# Patient Record
Sex: Female | Born: 1965 | Race: White | Hispanic: No | Smoking: Current every day smoker
Health system: Southern US, Community
[De-identification: ages and names within clinical notes are randomized; demographics above are authoritative.]

## PROBLEM LIST (undated history)

## (undated) DIAGNOSIS — G8929 Other chronic pain: Secondary | ICD-10-CM

## (undated) DIAGNOSIS — K279 Peptic ulcer, site unspecified, unspecified as acute or chronic, without hemorrhage or perforation: Secondary | ICD-10-CM

## (undated) DIAGNOSIS — K449 Diaphragmatic hernia without obstruction or gangrene: Secondary | ICD-10-CM

## (undated) DIAGNOSIS — M549 Dorsalgia, unspecified: Secondary | ICD-10-CM

## (undated) DIAGNOSIS — M1990 Unspecified osteoarthritis, unspecified site: Secondary | ICD-10-CM

## (undated) DIAGNOSIS — I639 Cerebral infarction, unspecified: Secondary | ICD-10-CM

## (undated) DIAGNOSIS — M79672 Pain in left foot: Secondary | ICD-10-CM

## (undated) DIAGNOSIS — M5430 Sciatica, unspecified side: Secondary | ICD-10-CM

## (undated) DIAGNOSIS — F329 Major depressive disorder, single episode, unspecified: Secondary | ICD-10-CM

## (undated) DIAGNOSIS — I10 Essential (primary) hypertension: Secondary | ICD-10-CM

## (undated) DIAGNOSIS — Q7270 Split foot, unspecified lower limb: Secondary | ICD-10-CM

## (undated) HISTORY — DX: Diaphragmatic hernia without obstruction or gangrene: K44.9

## (undated) HISTORY — PX: CLUB FOOT RELEASE: SHX1363

## (undated) HISTORY — DX: Essential (primary) hypertension: I10

## (undated) HISTORY — DX: Major depressive disorder, single episode, unspecified: F32.9

## (undated) HISTORY — DX: Peptic ulcer, site unspecified, unspecified as acute or chronic, without hemorrhage or perforation: K27.9

## (undated) HISTORY — DX: Split foot, unspecified lower limb: Q72.70

## (undated) HISTORY — DX: Unspecified osteoarthritis, unspecified site: M19.90

---

## 1991-08-11 HISTORY — PX: CHOLECYSTECTOMY: SHX55

## 1997-08-10 HISTORY — PX: TUBAL LIGATION: SHX77

## 2001-08-10 DIAGNOSIS — F32A Depression, unspecified: Secondary | ICD-10-CM

## 2001-08-10 HISTORY — DX: Depression, unspecified: F32.A

## 2004-10-16 ENCOUNTER — Emergency Department (HOSPITAL_COMMUNITY): Admission: EM | Admit: 2004-10-16 | Discharge: 2004-10-16 | Payer: Self-pay | Admitting: Emergency Medicine

## 2005-02-27 ENCOUNTER — Emergency Department (HOSPITAL_COMMUNITY): Admission: EM | Admit: 2005-02-27 | Discharge: 2005-02-27 | Payer: Self-pay | Admitting: Emergency Medicine

## 2007-02-24 ENCOUNTER — Emergency Department (HOSPITAL_COMMUNITY): Admission: EM | Admit: 2007-02-24 | Discharge: 2007-02-25 | Payer: Self-pay | Admitting: Emergency Medicine

## 2007-02-25 ENCOUNTER — Inpatient Hospital Stay (HOSPITAL_COMMUNITY): Admission: AD | Admit: 2007-02-25 | Discharge: 2007-02-28 | Payer: Self-pay | Admitting: *Deleted

## 2007-02-25 ENCOUNTER — Ambulatory Visit: Payer: Self-pay | Admitting: *Deleted

## 2007-04-04 ENCOUNTER — Emergency Department (HOSPITAL_COMMUNITY): Admission: EM | Admit: 2007-04-04 | Discharge: 2007-04-04 | Payer: Self-pay | Admitting: Emergency Medicine

## 2008-02-04 ENCOUNTER — Emergency Department (HOSPITAL_COMMUNITY): Admission: EM | Admit: 2008-02-04 | Discharge: 2008-02-05 | Payer: Self-pay | Admitting: Emergency Medicine

## 2008-08-05 ENCOUNTER — Emergency Department (HOSPITAL_COMMUNITY): Admission: EM | Admit: 2008-08-05 | Discharge: 2008-08-05 | Payer: Self-pay | Admitting: Emergency Medicine

## 2009-08-23 ENCOUNTER — Ambulatory Visit (HOSPITAL_COMMUNITY): Admission: RE | Admit: 2009-08-23 | Discharge: 2009-08-23 | Payer: Self-pay | Admitting: Family Medicine

## 2010-10-19 ENCOUNTER — Emergency Department (HOSPITAL_COMMUNITY)
Admission: EM | Admit: 2010-10-19 | Discharge: 2010-10-20 | Disposition: A | Discharge status: Home / Self Care | Payer: Self-pay | Attending: Emergency Medicine | Admitting: Emergency Medicine

## 2010-10-19 DIAGNOSIS — R109 Unspecified abdominal pain: Secondary | ICD-10-CM | POA: Insufficient documentation

## 2010-10-19 DIAGNOSIS — K861 Other chronic pancreatitis: Secondary | ICD-10-CM | POA: Insufficient documentation

## 2010-10-19 DIAGNOSIS — E876 Hypokalemia: Secondary | ICD-10-CM | POA: Insufficient documentation

## 2010-10-19 DIAGNOSIS — F1010 Alcohol abuse, uncomplicated: Secondary | ICD-10-CM | POA: Insufficient documentation

## 2010-10-19 LAB — COMPREHENSIVE METABOLIC PANEL
ALT: 76 U/L — ABNORMAL HIGH (ref 0–35)
AST: 57 U/L — ABNORMAL HIGH (ref 0–37)
Albumin: 3.6 g/dL (ref 3.5–5.2)
BUN: 9 mg/dL (ref 6–23)
CO2: 24 mEq/L (ref 19–32)
Calcium: 8.1 mg/dL — ABNORMAL LOW (ref 8.4–10.5)
Chloride: 103 mEq/L (ref 96–112)
Glucose, Bld: 102 mg/dL — ABNORMAL HIGH (ref 70–99)
Potassium: 3 mEq/L — ABNORMAL LOW (ref 3.5–5.1)
Total Protein: 6.1 g/dL (ref 6.0–8.3)

## 2010-10-19 LAB — URINALYSIS, ROUTINE W REFLEX MICROSCOPIC
Bilirubin Urine: NEGATIVE
Hgb urine dipstick: NEGATIVE
Ketones, ur: NEGATIVE mg/dL
Nitrite: NEGATIVE

## 2010-10-19 LAB — RAPID URINE DRUG SCREEN, HOSP PERFORMED
Amphetamines: NOT DETECTED
Barbiturates: NOT DETECTED
Cocaine: NOT DETECTED
Tetrahydrocannabinol: POSITIVE — AB

## 2010-10-19 LAB — LIPASE, BLOOD: Lipase: 58 U/L (ref 11–59)

## 2010-10-19 LAB — POCT PREGNANCY, URINE: Preg Test, Ur: NEGATIVE

## 2010-10-19 LAB — DIFFERENTIAL
Basophils Relative: 0 % (ref 0–1)
Eosinophils Absolute: 0 10*3/uL (ref 0.0–0.7)
Eosinophils Relative: 0 % (ref 0–5)
Monocytes Absolute: 0.9 10*3/uL (ref 0.1–1.0)
Monocytes Relative: 6 % (ref 3–12)
Neutrophils Relative %: 76 % (ref 43–77)

## 2010-10-19 LAB — CBC
HCT: 41.4 % (ref 36.0–46.0)
MCHC: 34.5 g/dL (ref 30.0–36.0)
RDW: 13.4 % (ref 11.5–15.5)
WBC: 13.4 10*3/uL — ABNORMAL HIGH (ref 4.0–10.5)

## 2010-12-23 NOTE — Discharge Summary (Signed)
NAMEDEE, Jasmine NO.:  [id number]   MEDICAL RECORD NO.:  [id number]          PATIENT TYPE:  IPS   LOCATION:  0306                          FACILITY:  [facility]   PHYSICIAN:  Jasmine Pang, M.D. DATE OF BIRTH:  Dec 29, 1965   DATE OF ADMISSION:  02/25/2007  DATE OF DISCHARGE:  02/28/2007                               DISCHARGE SUMMARY   IDENTIFICATION:  [age]-year-old divorced white female who was  admitted on an involuntary basis on February 25, 2007.   HISTORY OF PRESENT ILLNESS:  The patient has a history of depression and  alcohol use.  She was [age] admitted on petition papers, stating she was  depressed, suicidal and was attempting suicide by daily alcohol  ingestion as well as hydrocodone pills (a friend's pills).  She states  she tried to hurt self with an overdose on Vicodin.  She states that  she had been having conflict with her father and been drinking 12 packs  daily for the past 5 years.  Her last drink was the day before  admission.  This is the first [facility] admission for this patient.  She was  at [facility] 9 months.  The father has a history of alcoholism.  She has no  medical problems.  She is on no medications.  No known drug allergies.   PHYSICAL FINDINGS:  Short-statured female with no acute distress.  Physical exam was done at [facility] and there were no acute physical  problems.   ADMISSION LABORATORIES:  Alcohol level was 302.  Urinalysis negative.  BMET within normal limits.  UDS negative.  WBC was 10.8, MCV 101.5.  Hepatic profile was grossly within normal limits except for decreased  albumin of 3.4.   HOSPITAL COURSE:  Upon admission, the patient was started on Librium 25  mg p.o. q.6 hours p.r.n. withdrawal symptoms.  She was also started on  Ambien 5 mg p.o. nightly p.r.n., may repeat x1.  On February 25, 2007, she  was started on Celexa 20 mg p.o. daily.  On February 26, 2007, due to  elevated blood pressure, she was started on Catapres patch  0.1 mg q.7  days.  The patient tolerated medications well with no significant side  effects.  She was sad and tearful.  States she is depressed.  Also using  alcohol daily (12 packs per day).  She states she went to [facility] ED due to suicidal ideation.  She admits to having taken an  overdose of Vicodin and alcohol.  Stressors include not being able to  find a job, not having her own place, and living with her father.  She  was started on Celexa 20 mg daily.  She was able to participate  appropriately in unit therapeutic groups and activities.  Her mood and  mental status improved as the hospitalization progressed.  She became  less depressed.  She was not suicidal or homicidal.  Sleep and appetite  were good.  She continued to improve, and on February 28, 2007, mental  status improved markedly from admission status.  She  was friendly and  cooperative with good eye contact.  Speech normal rate and flow.  Psychomotor activity was within normal limits.  Mood euthymic.  Affect  wide range with no suicidal or homicidal ideation.  No thoughts of self-  injurious behavior.  No paranoia or delusions.  Thoughts were logical  and goal-directed.  Thought content no predominant theme.  The cognitive  was grossly within normal limits and back to baseline.  It was felt  patient was safe to be sent home today.   DISCHARGE DIAGNOSES:  AXIS I:  Major depressive disorder, recurrent,  severe, without psychosis.  Also alcohol dependence.  AXIS II:  None.  AXIS III:  Tobacco abuse.  AXIS IV:  Severe (problems with primary support group, occupational  problem, housing problem, economic problem, problems related to legal  system, other psychosocial problems, problems related to burden of  psychiatric illness).  AXIS V:  Global assessment of functioning upon discharge was 50.  Global  assessment of functioning upon admission was 35.  Global assessment of  functioning highest the past year 47.    DISCHARGE PLANS:  There were no specific activity level or dietary  restrictions.   DISCHARGE/PLAN:  The patient's discharge plans will be arranged by our  case manager.  She is to go to the follow-up free clinic for information  and follow-up on her high blood pressure and a Catapres patch.   DISCHARGE MEDICATIONS:  1. Celexa 20 mg daily.  2. Catapres-TTS patch apply weekly next on July 29 for 1 week.      Jasmine Pang, M.D.  Electronically Signed     BHS/MEDQ  D:  02/28/2007  T:  02/28/2007  Job:  [id number]

## 2011-05-07 LAB — RAPID URINE DRUG SCREEN, HOSP PERFORMED
Amphetamines: NOT DETECTED
Barbiturates: NOT DETECTED
Benzodiazepines: NOT DETECTED
Cocaine: NOT DETECTED
Opiates: NOT DETECTED

## 2011-05-07 LAB — BASIC METABOLIC PANEL
CO2: 23
Chloride: 104
Glucose, Bld: 113 — ABNORMAL HIGH
Potassium: 3.9

## 2011-05-07 LAB — CBC
HCT: 41.7
Hemoglobin: 14.5
MCHC: 34.8
MCV: 97.8
Platelets: 299
RBC: 4.26
WBC: 12.7 — ABNORMAL HIGH

## 2011-05-07 LAB — URINALYSIS, ROUTINE W REFLEX MICROSCOPIC
Bilirubin Urine: NEGATIVE
Ketones, ur: NEGATIVE
Nitrite: NEGATIVE

## 2011-05-22 LAB — CBC
HCT: 37.7
Hemoglobin: 13.1
Platelets: 287
RDW: 12

## 2011-05-22 LAB — DIFFERENTIAL
Monocytes Absolute: 1 — ABNORMAL HIGH
Monocytes Relative: 6
Neutro Abs: 10.6 — ABNORMAL HIGH

## 2011-05-22 LAB — COMPREHENSIVE METABOLIC PANEL
Albumin: 3.5
BUN: 5 — ABNORMAL LOW
Chloride: 104
GFR calc non Af Amer: >60
Glucose, Bld: 77
Potassium: 3.3 — ABNORMAL LOW
Sodium: 135
Total Bilirubin: 0.4
Total Protein: 6

## 2011-05-25 LAB — ETHANOL: Alcohol, Ethyl (B): 302 — ABNORMAL HIGH

## 2011-05-25 LAB — RAPID URINE DRUG SCREEN, HOSP PERFORMED
Barbiturates: NOT DETECTED
Opiates: NOT DETECTED
Tetrahydrocannabinol: NOT DETECTED

## 2011-05-25 LAB — HEPATIC FUNCTION PANEL
Albumin: 3.4 — ABNORMAL LOW
Total Protein: 6.5

## 2011-05-25 LAB — URINALYSIS, ROUTINE W REFLEX MICROSCOPIC
Glucose, UA: NEGATIVE
Ketones, ur: NEGATIVE
Leukocytes, UA: NEGATIVE
pH: 5.5

## 2011-05-25 LAB — BASIC METABOLIC PANEL
BUN: 9
CO2: 20
Chloride: 113 — ABNORMAL HIGH
Creatinine, Ser: 0.69
Glucose, Bld: 90

## 2011-05-25 LAB — CBC
HCT: 46
Hemoglobin: 15.8 — ABNORMAL HIGH
Platelets: 312
RBC: 4.54
WBC: 10.8 — ABNORMAL HIGH

## 2011-05-25 LAB — DIFFERENTIAL
Eosinophils Relative: 1
Lymphocytes Relative: 44
Lymphs Abs: 4.7 — ABNORMAL HIGH

## 2011-05-25 LAB — URINE MICROSCOPIC-ADD ON

## 2011-09-25 ENCOUNTER — Emergency Department (HOSPITAL_COMMUNITY): Payer: Self-pay

## 2011-09-25 ENCOUNTER — Emergency Department (HOSPITAL_COMMUNITY)
Admission: EM | Admit: 2011-09-25 | Discharge: 2011-09-25 | Disposition: A | Discharge status: Home / Self Care | Payer: Self-pay | Attending: Emergency Medicine | Admitting: Emergency Medicine

## 2011-09-25 ENCOUNTER — Other Ambulatory Visit: Payer: Self-pay

## 2011-09-25 ENCOUNTER — Encounter (HOSPITAL_COMMUNITY): Payer: Self-pay

## 2011-09-25 DIAGNOSIS — R079 Chest pain, unspecified: Secondary | ICD-10-CM | POA: Insufficient documentation

## 2011-09-25 DIAGNOSIS — R1013 Epigastric pain: Secondary | ICD-10-CM | POA: Insufficient documentation

## 2011-09-25 DIAGNOSIS — F17200 Nicotine dependence, unspecified, uncomplicated: Secondary | ICD-10-CM | POA: Insufficient documentation

## 2011-09-25 DIAGNOSIS — R10816 Epigastric abdominal tenderness: Secondary | ICD-10-CM | POA: Insufficient documentation

## 2011-09-25 DIAGNOSIS — K920 Hematemesis: Secondary | ICD-10-CM | POA: Insufficient documentation

## 2011-09-25 LAB — CBC
MCH: 35 pg — ABNORMAL HIGH (ref 26.0–34.0)
MCV: 100.2 fL — ABNORMAL HIGH (ref 78.0–100.0)
Platelets: 286 10*3/uL (ref 150–400)
RDW: 13.4 % (ref 11.5–15.5)

## 2011-09-25 LAB — COMPREHENSIVE METABOLIC PANEL
ALT: 21 U/L (ref 0–35)
Alkaline Phosphatase: 90 U/L (ref 39–117)
CO2: 23 mEq/L (ref 19–32)
Chloride: 103 mEq/L (ref 96–112)
GFR calc Af Amer: >90 mL/min (ref >90)
GFR calc non Af Amer: 86 mL/min — ABNORMAL LOW (ref >90)
Glucose, Bld: 97 mg/dL (ref 70–99)
Potassium: 3.9 mEq/L (ref 3.5–5.1)
Sodium: 138 mEq/L (ref 135–145)

## 2011-09-25 MED ORDER — HYDROMORPHONE HCL PF 1 MG/ML IJ SOLN
1.0000 mg | Freq: Once | INTRAMUSCULAR | Status: AC
  Administered 2011-09-25: 1 mg via INTRAVENOUS
  Filled 2011-09-25: qty 1

## 2011-09-25 MED ORDER — IOHEXOL 300 MG/ML  SOLN
100.0000 mL | Freq: Once | INTRAMUSCULAR | Status: AC | PRN
  Administered 2011-09-25: 100 mL via INTRAVENOUS

## 2011-09-25 MED ORDER — PANTOPRAZOLE SODIUM 40 MG IV SOLR
40.0000 mg | Freq: Once | INTRAVENOUS | Status: AC
  Administered 2011-09-25: 40 mg via INTRAVENOUS
  Filled 2011-09-25: qty 40

## 2011-09-25 MED ORDER — SODIUM CHLORIDE 0.9 % IV SOLN: INTRAVENOUS | Status: DC

## 2011-09-25 MED ORDER — PROMETHAZINE HCL 25 MG PO TABS: 25.0000 mg | ORAL_TABLET | Freq: Four times a day (QID) | ORAL | Status: AC | PRN

## 2011-09-25 MED ORDER — ONDANSETRON HCL 4 MG/2ML IJ SOLN
4.0000 mg | Freq: Once | INTRAMUSCULAR | Status: AC
  Administered 2011-09-25: 4 mg via INTRAVENOUS
  Filled 2011-09-25: qty 2

## 2011-09-25 MED ORDER — IOHEXOL 300 MG/ML  SOLN
40.0000 mL | Freq: Once | INTRAMUSCULAR | Status: AC | PRN
  Administered 2011-09-25: 40 mL via ORAL

## 2011-09-25 MED ORDER — HYDROCODONE-ACETAMINOPHEN 5-325 MG PO TABS: 1.0000 | ORAL_TABLET | Freq: Four times a day (QID) | ORAL | Status: AC | PRN

## 2011-09-25 MED ORDER — SODIUM CHLORIDE 0.9 % IV BOLUS (SEPSIS)
250.0000 mL | Freq: Once | INTRAVENOUS | Status: AC
  Administered 2011-09-25: 250 mL via INTRAVENOUS

## 2011-09-25 MED ORDER — FAMOTIDINE 20 MG PO TABS: 20.0000 mg | ORAL_TABLET | Freq: Two times a day (BID) | ORAL | Status: DC

## 2011-09-25 NOTE — ED Notes (Signed)
Pt over in ct

## 2011-09-25 NOTE — ED Notes (Signed)
Pt presents with epigastric pain and hematemesis x 2 weeks. Pt states blood in vomit is bright red and "with little chunks".

## 2011-09-25 NOTE — ED Provider Notes (Signed)
History   This chart was scribed for Jasmine Jakes, MD by Melba Coon. The patient was seen in room APA14/APA14 and the patient's care was started at 5:09PM.    CSN: [id number]  Arrival date & time 09/25/11  1515   First MD Initiated Contact with Patient 09/25/11 1644      Chief Complaint  Patient presents with  . Hematemesis  . Abdominal Pain    (Consider location/radiation/quality/duration/timing/severity/associated sxs/prior treatment) HPI Jasmine Davies is a 46 y.o. female who presents to the Emergency Department complaining of constant, non-radiating moderate to severe hematemesis with associated sharp abdominal pain with an onset 2 weeks ago. At onset, pt vomited a pool of blood, but since then has only vomited little chunks. Today, vomit 1x. Pt has never had anything like this before. Abd pain has been getting worse since onset. Pt rates the severity of the pain 8/10. Pt also c/o left-sided CP with left arm tingling, which was there well before onset and may be unrelated to present symptoms. Pt has not taken meds at home. No HA, cold, cough, nasal congestion, neck pain, back pain, edema,  rash, dysuria, or diarrhea. No known allergies to medications. 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  No PCP    History reviewed. No pertinent past medical history.  Past Surgical History  Procedure Date  . Club foot release     No family history on file.  History  Substance Use Topics  . Smoking status: Current Everyday Smoker -- 0.5 packs/day  . Smokeless tobacco: Not on file  . Alcohol Use: Yes    OB History    Grav Para Term Preterm Abortions TAB SAB Ect Mult Living                  Review of Systems 10 Systems reviewed and are negative for acute change except as noted in the HPI.  Allergies  Review of patient's allergies indicates no known allergies.  Home Medications   Current Outpatient Rx  Name Route Sig  Dispense Refill  . GOODY HEADACHE PO Oral Take 1 packet by mouth 2 (two) times daily as needed. For pain    . FAMOTIDINE 20 MG PO TABS Oral Take 1 tablet (20 mg total) by mouth 2 (two) times daily. 30 tablet 0  . HYDROCODONE-ACETAMINOPHEN 5-325 MG PO TABS Oral Take 1-2 tablets by mouth every 6 (six) hours as needed for pain. 10 tablet 0  . PROMETHAZINE HCL 25 MG PO TABS Oral Take 1 tablet (25 mg total) by mouth every 6 (six) hours as needed for nausea. 12 tablet 0    BP 139/91  Pulse 75  Temp(Src) 98.1 F (36.7 C) (Oral)  Resp 18  Ht 4\' 11"  (1.499 m)  Wt 150 lb (68.04 kg)  BMI 30.30 kg/m2  SpO2 100%  Physical Exam  Nursing note and vitals reviewed. Constitutional: She appears well-developed and well-nourished.       Awake, alert, nontoxic appearance.  HENT:  Head: Normocephalic and atraumatic.  Eyes: Conjunctivae and EOM are normal. Pupils are equal, round, and reactive to light. Right eye exhibits no discharge. Left eye exhibits no discharge.  Neck: Normal range of motion. Neck supple.  Cardiovascular: Normal rate, regular rhythm and normal heart sounds.   No murmur heard. Pulmonary/Chest: Effort normal and breath sounds normal. She has no wheezes. She exhibits no tenderness.  Abdominal: Soft. There is tenderness (epigastric). There is no rebound.  Musculoskeletal: She exhibits no edema and no tenderness.       Baseline ROM, no obvious new focal weakness.  Neurological:       Mental status and motor strength appears baseline for patient and situation.  Skin: Skin is warm. No rash noted.  Psychiatric: She has a normal mood and affect.    ED Course  Procedures (including critical care time)  DIAGNOSTIC STUDIES: Oxygen Saturation is 100% on room air, normal by my interpretation.    COORDINATION OF CARE:  Results for orders placed during the hospital encounter of 09/25/11  COMPREHENSIVE METABOLIC PANEL      Component Value Range   Sodium 138  135 - 145 (mEq/L)    Potassium 3.9  3.5 - 5.1 (mEq/L)   Chloride 103  96 - 112 (mEq/L)   CO2 23  19 - 32 (mEq/L)   Glucose, Bld 97  70 - 99 (mg/dL)   BUN 10  6 - 23 (mg/dL)   Creatinine, Ser 1.61  0.50 - 1.10 (mg/dL)   Calcium 9.3  8.4 - 09.6 (mg/dL)   Total Protein 7.3  6.0 - 8.3 (g/dL)   Albumin 4.0  3.5 - 5.2 (g/dL)   AST 20  0 - 37 (U/L)   ALT 21  0 - 35 (U/L)   Alkaline Phosphatase 90  39 - 117 (U/L)   Total Bilirubin 0.3  0.3 - 1.2 (mg/dL)   GFR calc non Af Amer 86 (*) >90 (mL/min)   GFR calc Af Amer >90  >90 (mL/min)  LIPASE, BLOOD      Component Value Range   Lipase 41  11 - 59 (U/L)  TROPONIN I      Component Value Range   Troponin I <0.30  <0.30 (ng/mL)  CBC      Component Value Range   WBC 10.9 (*) 4.0 - 10.5 (K/uL)   RBC 4.32  3.87 - 5.11 (MIL/uL)   Hemoglobin 15.1 (*) 12.0 - 15.0 (g/dL)   HCT 04.5  40.9 - 81.1 (%)   MCV 100.2 (*) 78.0 - 100.0 (fL)   MCH 35.0 (*) 26.0 - 34.0 (pg)   MCHC 34.9  30.0 - 36.0 (g/dL)   RDW 91.4  78.2 - 95.6 (%)   Platelets 286  150 - 400 (K/uL)     Dg Chest 2 View  09/25/2011  *RADIOLOGY REPORT*  Clinical Data: Hematemesis and sharp abdominal pain.  CHEST - 2 VIEW  Comparison: Abdominal CT 09/25/2011  Findings: Two views of the chest were obtained.  The lungs are clear bilaterally.  Heart and mediastinum are within normal limits. No evidence for edema or pleural effusions.  Cholecystectomy clips in the right upper quadrant. Trachea is midline.  Density just anterior to the upper thoracic spine on the lateral view probably represents overlying shadows.  There is no gross abnormality in this region on the frontal radiograph.  IMPRESSION: No acute chest findings.  Original Report Authenticated By: Richarda Overlie, M.D.   Ct Abdomen Pelvis W Contrast  09/25/2011  *RADIOLOGY REPORT*  Clinical Data: Sharp abdominal pain. Vomiting blood.  CT ABDOMEN AND PELVIS WITH CONTRAST  Technique:  Multidetector CT imaging of the abdomen and pelvis was performed following the  standard protocol during bolus administration of intravenous contrast.  Contrast: OMNIPAQUE IOHEXOL 300 MG/ML IV SOLN  Comparison: None.  Findings: There is a 2 mm noncalcified nodular density at the posterior right lung base on sequence #3, image 9.  There is no evidence for  free intraperitoneal air.  Focal pleural thickening along the medial right hemidiaphragm.  There is mild wall thickening involving the gastric antrum which is nonspecific and less prominent on the delayed images.  The gallbladder has been removed and there is dilatation of the biliary system which is likely secondary to the cholecystectomy. Common bile duct measures up to 1 cm.  There is no gross abnormality involving the pancreas.  Normal appearance of the liver, portal venous system, adrenal glands, kidneys and spleen. There is no significant free fluid or lymphadenopathy in the abdomen or pelvis.  Normal appearance of the uterus, adnexal structures and urinary bladder.  The appendix is gas-filled with a normal appearance.  No acute bony abnormality.  IMPRESSION: No acute abdominal or pelvic findings.  Mild dilatation of the biliary system and common bile duct measures up to 1 cm.  Findings are probably related to the cholecystectomy.  Mild thickening of the gastric antrum which could be within normal limits.  2 mm nodular density at the right lung base.  If the patient is at high risk for bronchogenic carcinoma, follow-up chest CT at 1 year is recommended.  If the patient is at low risk, no follow-up is needed.  This recommendation follows the consensus statement: Guidelines for Management of Small Pulmonary Nodules Detected on CT Scans:  A Statement from the Fleischner Society as published in Radiology 2005; 237:395-400.  Available online at: DietDisorder.cz.  Original Report Authenticated By: Richarda Overlie, M.D.     Date: 09/25/2011  Rate: 65  Rhythm: normal sinus rhythm  QRS Axis: normal   Intervals: normal  ST/T Wave abnormalities: normal  Conduction Disutrbances:none  Narrative Interpretation:   Old EKG Reviewed: none available    1. Epigastric abdominal pain       MDM  Patient with epigastric abdominal pain and vomiting some flecks of blood workup negative hemoglobin and hematocrit is normal CT scan negative lipase not consistent with pancreatitis LFTs not consistent with hepatitis. Suspect gastritis or perhaps peptic ulcer disease will start Pepcid. Referral to GI made.  I personally performed the services described in this documentation, which was scribed in my presence. The recorded information has been reviewed and considered.         Jasmine Jakes, MD 09/25/11 [phone]

## 2011-10-01 ENCOUNTER — Encounter: Payer: Self-pay | Admitting: Gastroenterology

## 2011-10-01 ENCOUNTER — Ambulatory Visit (INDEPENDENT_AMBULATORY_CARE_PROVIDER_SITE_OTHER): Payer: Self-pay | Admitting: Gastroenterology

## 2011-10-01 VITALS — BP 131/91 | HR 73 | Temp 98.1°F | Ht 59.0 in | Wt 153.2 lb

## 2011-10-01 DIAGNOSIS — R9389 Abnormal findings on diagnostic imaging of other specified body structures: Secondary | ICD-10-CM

## 2011-10-01 DIAGNOSIS — K920 Hematemesis: Secondary | ICD-10-CM

## 2011-10-01 DIAGNOSIS — R1013 Epigastric pain: Secondary | ICD-10-CM

## 2011-10-01 MED ORDER — OMEPRAZOLE 20 MG PO CPDR: 20.0000 mg | DELAYED_RELEASE_CAPSULE | Freq: Two times a day (BID) | ORAL | Status: DC

## 2011-10-01 NOTE — Assessment & Plan Note (Signed)
See "epigastric pain"

## 2011-10-01 NOTE — Patient Instructions (Signed)
Stop Pepcid.  Begin Prilosec 20 mg 30 minutes before breakfast and 30 minutes before dinner.   We have set you up for an upper endoscopy as soon as possible. You will need to obtain assistance with Lubertha Basque as soon as possible so we may proceed.

## 2011-10-01 NOTE — Progress Notes (Signed)
No PCP on file 

## 2011-10-01 NOTE — Assessment & Plan Note (Signed)
46 year old with chronic epigastric pain in the setting of daily ETOH use (12ppd) and intermittent use of Goody's powders. Stopped drinking reportedly after first episode of hematemesis. Recent large volume hematemesis at home several weeks ago. ED presentation 2/15 due to blood clots in emesis. No anemia at the time, no LFT elevation. CT with findings gastric antrum thickening. Mild dilation of CBD up to 1 cm but likely physiological. Pt likely dealing with gastritis/PUD secondary to ETOH and aspirin powders. Will pursue EGD as soon as possible; pt is pending [place] assistance. Instructed to present to ED if any further hematemesis or worsening of symptoms.   Proceed with upper endoscopy in the near future with Dr. Darrick Penna, with Propofol due to hx of heavy ETOH use. The risks, benefits, and alternatives have been discussed in detail with patient. They have stated understanding and desire to proceed.  Prilosec 20 mg po BID, sent to pharmacy

## 2011-10-01 NOTE — Assessment & Plan Note (Signed)
Noted 2 mm nodular density of right lung base on CT chest 09/25/11. Will need follow-up CT chest in 1 year, Feb 2014.

## 2011-10-01 NOTE — Progress Notes (Signed)
Primary Care Physician:  No primary provider on file. Primary Gastroenterologist:  Dr. Darrick Penna   Chief Complaint  Patient presents with  . Abdominal Pain  . Nausea    HPI:   Jasmine Davies presents today after ED presentation on 2/15 secondary to hematemesis. Reports pure hematemesis several weeks ago. Second time presented to ED on 2/15 with clots. Reports onset of epigastric pain precipitating first episode. Has noted intermittent epigastric pain chronically for almost a year. States abdomen becomes bloated/hard after eating. Goody's powders routinely in the past (approximately 4 years ago). Now taken 3 Goody's since out of hospital. No NSAIDs. Used to drink heavily (ETOH) prior to first episode of hematemesis. Beer usually 12 pack a day, since age 45. Quit cold [place] after first episode of hematemesis.   Given Pepcid BID in ED. Stools appear darker than normal.  No rectal bleeding. No constipation/diarrhea.   2/15 ED Labs: No LFT abnormalities                         Lipase nl                         Hgb 15.1, PLTs 286  CT 2/15: IMPRESSION:  No acute abdominal or pelvic findings. Mild dilatation of the biliary system and common bile duct measures up to 1 cm. Findings are probably related to the cholecystectomy. Mild thickening of the gastric antrum which could be within normal limits. 2 mm nodular density at the right lung base. If the patient is at high risk for bronchogenic carcinoma, follow-up chest CT at 1 year  is recommended. If the patient is at low risk, no follow-up is needed.    NO PERTINENT PMH  Past Surgical History  Procedure Date  . Club foot release     left foot  . Cholecystectomy   . Tubal ligation     Current Outpatient Prescriptions  Medication Sig Dispense Refill  . Aspirin-Acetaminophen-Caffeine (GOODY HEADACHE PO) Take 1 packet by mouth 2 (two) times daily as needed. For pain      . HYDROcodone-acetaminophen (NORCO) 5-325 MG per tablet Take 1-2 tablets by  mouth every 6 (six) hours as needed for pain.  10 tablet  0  . promethazine (PHENERGAN) 25 MG tablet Take 1 tablet (25 mg total) by mouth every 6 (six) hours as needed for nausea.  12 tablet  0  . omeprazole (PRILOSEC) 20 MG capsule Take 1 capsule (20 mg total) by mouth 2 (two) times daily. Take 30 minutes before breakfast and 30 minutes before dinner  60 capsule  3    Allergies as of 10/01/2011  . (No Known Allergies)    Family History  Problem Relation Age of Onset  . Colon cancer Neg Hx     History   Social History  . Marital Status: Divorced    Spouse Name: N/A    Number of Children: N/A  . Years of Education: N/A   Occupational History  . Not on file.   Social History Main Topics  . Smoking status: Current Everyday Smoker -- 0.5 packs/day  . Smokeless tobacco: Not on file  . Alcohol Use: Yes     12 pack of beer daily   . Drug Use: No  . Sexually Active: Yes    Birth Control/ Protection: None   Other Topics Concern  . Not on file   Social History Narrative  . No narrative on  file    Review of Systems: Gen: Denies any fever, chills, fatigue, weight loss, lack of appetite.  CV: Denies chest pain, heart palpitations, peripheral edema, syncope.  Resp: Denies shortness of breath at rest or with exertion. Denies wheezing or cough.  GI: SEE HPI GU : Denies urinary burning, urinary frequency, urinary hesitancy MS: Denies joint pain, muscle weakness, cramps, or limitation of movement.  Derm: Denies rash, itching, dry skin Psych: Denies depression, anxiety, memory loss, and confusion Heme: Denies bruising, bleeding, and enlarged lymph nodes.  Physical Exam: BP 131/91  Pulse 73  Temp(Src) 98.1 F (36.7 C) (Temporal)  Ht 4\' 11"  (1.499 m)  Wt 153 lb 3.2 oz (69.491 kg)  BMI 30.94 kg/m2 General:   Alert and oriented. Pleasant and cooperative. Well-nourished and well-developed.  Head:  Normocephalic and atraumatic. Eyes:  Without icterus, sclera clear and conjunctiva  pink.  Ears:  Normal auditory acuity. Nose:  No deformity, discharge,  or lesions. Mouth:  No deformity or lesions, oral mucosa pink.  Neck:  Supple, without mass or thyromegaly. Lungs:  Clear to auscultation bilaterally. No wheezes, rales, or rhonchi. No distress.  Heart:  S1, S2 present without murmurs appreciated.  Abdomen:  +BS, soft, TTP LUQ, EPIGASTRIC REGION and non-distended. No HSM noted. No guarding or rebound. No masses appreciated.  Rectal:  Deferred  Msk:  Symmetrical without gross deformities. Normal posture. Extremities:  Without clubbing or edema. Neurologic:  Alert and  oriented x4;  grossly normal neurologically. Skin:  Intact without significant lesions or rashes. Cervical Nodes:  No significant cervical adenopathy. Psych:  Alert and cooperative. Normal mood and affect.

## 2011-10-06 ENCOUNTER — Encounter (HOSPITAL_COMMUNITY)
Admission: RE | Disposition: A | Discharge status: Home / Self Care | Payer: Self-pay | Source: Ambulatory Visit | Attending: Gastroenterology

## 2011-10-06 ENCOUNTER — Other Ambulatory Visit: Payer: Self-pay | Admitting: General Practice

## 2011-10-06 ENCOUNTER — Encounter (HOSPITAL_COMMUNITY): Payer: Self-pay | Admitting: *Deleted

## 2011-10-06 ENCOUNTER — Ambulatory Visit (HOSPITAL_COMMUNITY)
Admission: RE | Admit: 2011-10-06 | Discharge: 2011-10-06 | Disposition: A | Discharge status: Home / Self Care | Payer: Self-pay | Source: Ambulatory Visit | Attending: Gastroenterology | Admitting: Gastroenterology

## 2011-10-06 DIAGNOSIS — K449 Diaphragmatic hernia without obstruction or gangrene: Secondary | ICD-10-CM | POA: Insufficient documentation

## 2011-10-06 DIAGNOSIS — K259 Gastric ulcer, unspecified as acute or chronic, without hemorrhage or perforation: Secondary | ICD-10-CM

## 2011-10-06 DIAGNOSIS — R1013 Epigastric pain: Secondary | ICD-10-CM | POA: Insufficient documentation

## 2011-10-06 DIAGNOSIS — K2980 Duodenitis without bleeding: Secondary | ICD-10-CM | POA: Insufficient documentation

## 2011-10-06 DIAGNOSIS — K2940 Chronic atrophic gastritis without bleeding: Secondary | ICD-10-CM | POA: Insufficient documentation

## 2011-10-06 HISTORY — PX: ESOPHAGOGASTRODUODENOSCOPY: SHX5428

## 2011-10-06 SURGERY — EGD (ESOPHAGOGASTRODUODENOSCOPY)
Anesthesia: Moderate Sedation

## 2011-10-06 MED ORDER — PROMETHAZINE HCL 25 MG/ML IJ SOLN
INTRAMUSCULAR | Status: AC
  Filled 2011-10-06: qty 1

## 2011-10-06 MED ORDER — MIDAZOLAM HCL 5 MG/5ML IJ SOLN
INTRAMUSCULAR | Status: AC
  Filled 2011-10-06: qty 10

## 2011-10-06 MED ORDER — MIDAZOLAM HCL 5 MG/5ML IJ SOLN
INTRAMUSCULAR | Status: DC | PRN
  Administered 2011-10-06 (×2): 2 mg via INTRAVENOUS

## 2011-10-06 MED ORDER — SODIUM CHLORIDE 0.9 % IJ SOLN
INTRAMUSCULAR | Status: AC
  Filled 2011-10-06: qty 10

## 2011-10-06 MED ORDER — MEPERIDINE HCL 100 MG/ML IJ SOLN
INTRAMUSCULAR | Status: AC
  Filled 2011-10-06: qty 2

## 2011-10-06 MED ORDER — MEPERIDINE HCL 100 MG/ML IJ SOLN
INTRAMUSCULAR | Status: DC | PRN
  Administered 2011-10-06 (×2): 25 mg via INTRAVENOUS
  Administered 2011-10-06: 50 mg via INTRAVENOUS

## 2011-10-06 MED ORDER — PROMETHAZINE HCL 25 MG/ML IJ SOLN
INTRAMUSCULAR | Status: DC | PRN
  Administered 2011-10-06: 12.5 mg via INTRAVENOUS

## 2011-10-06 MED ORDER — STERILE WATER FOR IRRIGATION IR SOLN
Status: DC | PRN
  Administered 2011-10-06: 14:00:00

## 2011-10-06 MED ORDER — BUTAMBEN-TETRACAINE-BENZOCAINE 2-2-14 % EX AERO
INHALATION_SPRAY | CUTANEOUS | Status: DC | PRN
  Administered 2011-10-06: 2 via TOPICAL

## 2011-10-06 MED ORDER — SODIUM CHLORIDE 0.45 % IV SOLN
Freq: Once | INTRAVENOUS | Status: AC
  Administered 2011-10-06: 1000 mL via INTRAVENOUS

## 2011-10-06 NOTE — OR Nursing (Signed)
EPIC down during procedure. Completed charting in EPIC when EPIC was back up.

## 2011-10-06 NOTE — Interval H&P Note (Signed)
History and Physical Interval Note:  10/06/2011 2:15 PM  Jasmine Davies  has presented today for surgery, with the diagnosis of Epigastric Pain  The various methods of treatment have been discussed with the patient and family. After consideration of risks, benefits and other options for treatment, the patient has consented to  Procedure(s) (LRB): ESOPHAGOGASTRODUODENOSCOPY (EGD) (N/A) as a surgical intervention .  The patients' history has been reviewed, patient examined, no change in status, stable for surgery.  I have reviewed the patients' chart and labs.  Questions were answered to the patient's satisfaction.     [employer]

## 2011-10-06 NOTE — H&P (View-Only) (Signed)
Primary Care Physician:  No primary provider on file. Primary Gastroenterologist:  Dr. Fields   Chief Complaint  Patient presents with  . Abdominal Pain  . Nausea    HPI:   Jasmine Davies presents today after ED presentation on 2/15 secondary to hematemesis. Reports pure hematemesis several weeks ago. Second time presented to ED on 2/15 with clots. Reports onset of epigastric pain precipitating first episode. Has noted intermittent epigastric pain chronically for almost a year. States abdomen becomes bloated/hard after eating. Goody's powders routinely in the past (approximately 4 years ago). Now taken 3 Goody's since out of hospital. No NSAIDs. Used to drink heavily (ETOH) prior to first episode of hematemesis. Beer usually 12 pack a day, since age 18. Quit cold turkey after first episode of hematemesis.   Given Pepcid BID in ED. Stools appear darker than normal.  No rectal bleeding. No constipation/diarrhea.   2/15 ED Labs: No LFT abnormalities                         Lipase nl                         Hgb 15.1, PLTs 286  CT 2/15: IMPRESSION:  No acute abdominal or pelvic findings. Mild dilatation of the biliary system and common bile duct measures up to 1 cm. Findings are probably related to the cholecystectomy. Mild thickening of the gastric antrum which could be within normal limits. 2 mm nodular density at the right lung base. If the patient is at high risk for bronchogenic carcinoma, follow-up chest CT at 1 year  is recommended. If the patient is at low risk, no follow-up is needed.    NO PERTINENT PMH  Past Surgical History  Procedure Date  . Club foot release     left foot  . Cholecystectomy   . Tubal ligation     Current Outpatient Prescriptions  Medication Sig Dispense Refill  . Aspirin-Acetaminophen-Caffeine (GOODY HEADACHE PO) Take 1 packet by mouth 2 (two) times daily as needed. For pain      . HYDROcodone-acetaminophen (NORCO) 5-325 MG per tablet Take 1-2 tablets by  mouth every 6 (six) hours as needed for pain.  10 tablet  0  . promethazine (PHENERGAN) 25 MG tablet Take 1 tablet (25 mg total) by mouth every 6 (six) hours as needed for nausea.  12 tablet  0  . omeprazole (PRILOSEC) 20 MG capsule Take 1 capsule (20 mg total) by mouth 2 (two) times daily. Take 30 minutes before breakfast and 30 minutes before dinner  60 capsule  3    Allergies as of 10/01/2011  . (No Known Allergies)    Family History  Problem Relation Age of Onset  . Colon cancer Neg Hx     History   Social History  . Marital Status: Divorced    Spouse Name: N/A    Number of Children: N/A  . Years of Education: N/A   Occupational History  . Not on file.   Social History Main Topics  . Smoking status: Current Everyday Smoker -- 0.5 packs/day  . Smokeless tobacco: Not on file  . Alcohol Use: Yes     12 pack of beer daily   . Drug Use: No  . Sexually Active: Yes    Birth Control/ Protection: None   Other Topics Concern  . Not on file   Social History Narrative  . No narrative on   file    Review of Systems: Gen: Denies any fever, chills, fatigue, weight loss, lack of appetite.  CV: Denies chest pain, heart palpitations, peripheral edema, syncope.  Resp: Denies shortness of breath at rest or with exertion. Denies wheezing or cough.  GI: SEE HPI GU : Denies urinary burning, urinary frequency, urinary hesitancy MS: Denies joint pain, muscle weakness, cramps, or limitation of movement.  Derm: Denies rash, itching, dry skin Psych: Denies depression, anxiety, memory loss, and confusion Heme: Denies bruising, bleeding, and enlarged lymph nodes.  Physical Exam: BP 131/91  Pulse 73  Temp(Src) 98.1 F (36.7 C) (Temporal)  Ht 4' 11" (1.499 m)  Wt 153 lb 3.2 oz (69.491 kg)  BMI 30.94 kg/m2 General:   Alert and oriented. Pleasant and cooperative. Well-nourished and well-developed.  Head:  Normocephalic and atraumatic. Eyes:  Without icterus, sclera clear and conjunctiva  pink.  Ears:  Normal auditory acuity. Nose:  No deformity, discharge,  or lesions. Mouth:  No deformity or lesions, oral mucosa pink.  Neck:  Supple, without mass or thyromegaly. Lungs:  Clear to auscultation bilaterally. No wheezes, rales, or rhonchi. No distress.  Heart:  S1, S2 present without murmurs appreciated.  Abdomen:  +BS, soft, TTP LUQ, EPIGASTRIC REGION and non-distended. No HSM noted. No guarding or rebound. No masses appreciated.  Rectal:  Deferred  Msk:  Symmetrical without gross deformities. Normal posture. Extremities:  Without clubbing or edema. Neurologic:  Alert and  oriented x4;  grossly normal neurologically. Skin:  Intact without significant lesions or rashes. Cervical Nodes:  No significant cervical adenopathy. Psych:  Alert and cooperative. Normal mood and affect.    

## 2011-10-06 NOTE — Op Note (Signed)
[facility] [address] [place], [place]  [address]  ENDOSCOPY PROCEDURE REPORT  PATIENT:  Jasmine Davies, Jasmine Davies  MR#:  [id number] BIRTHDATE:  Apr 28, 1966, 45 yrs. old  GENDER:  female  ENDOSCOPIST:  Jonette Eva, MD Referred by:  PROCEDURE DATE:  10/06/2011 PROCEDURE:  EGD with biopsy, 43239 ASA CLASS: INDICATIONS:  EPIGASTRIC PAIN USE ETOH QD AND BC POWDERS  MEDICATIONS:   Demerol 100 mg IV, Versed 4 mg IV, promethazine (Phenergan) 12.5 mg IV TOPICAL ANESTHETIC:  Cetacaine Spray  DESCRIPTION OF PROCEDURE:     Physical exam was performed. Informed consent was obtained from the patient after explaining the benefits, risks, and alternatives to the procedure.  The patient was connected to the monitor and placed in the left lateral position.  Continuous oxygen was provided by nasal cannula and IV medicine administered through an indwelling cannula.  After administration of sedation, the patient's esophagus was intubated and the [id number] ([id number]) endoscope was advanced under direct visualization to the second portion of the duodenum.  The scope was removed slowly by carefully examining the color, texture, anatomy, and integrity of the mucosa on the way out.  The patient was recovered in endoscopy and discharged home in satisfactory condition. <<PROCEDUREIMAGES>>  Multiple ulcers were found in the antrum & BIOPSIED VIA COLD FORCEPS.  A SMALL hiatal hernia was found. NO ARRETT'S. ,IDL DUODENITIS.  COMPLICATIONS:    None  ENDOSCOPIC IMPRESSION: 1) Ulcers, multiple in the antrum & DUODENITIS CAUSING EPIGASTRIC PAIN 2) Hiatal hernia  RECOMMENDATIONS: AWAIT BIOPSIES STOP ETOH/ASA. WILL PROVIDE LIBRIUM IF NEEDED. OMP BID OPV IN 3 MOS. SCHEDULE EGD TO ASSESS HEALING AFTER NEXT VISIT.  REPEAT EXAM:  No  ______________________________ Jonette Eva, MD  CC:  n. eSIGNED:   Catha Ontko at 10/06/2011 04:21 PM  Crista Elliot, [phone]

## 2011-10-06 NOTE — Discharge Instructions (Addendum)
Cholesterol Control Diet Cholesterol levels in your body are determined significantly by your diet. Cholesterol levels may also be related to heart disease. The following material helps to explain this relationship and discusses what you can do to help keep your heart healthy. Not all cholesterol is bad. Low-density lipoprotein (LDL) cholesterol is the "bad" cholesterol. It may cause fatty deposits to build up inside your arteries. High-density lipoprotein (HDL) cholesterol is "good." It helps to remove the "bad" LDL cholesterol from your blood. Cholesterol is a very important risk factor for heart disease. Other risk factors are high blood pressure, smoking, stress, heredity, and weight. The heart muscle gets its supply of blood through the coronary arteries. If your LDL cholesterol is high and your HDL cholesterol is low, you are at risk for having fatty deposits build up in your coronary arteries. This leaves less room through which blood can flow. Without sufficient blood and oxygen, the heart muscle cannot function properly and you may feel chest pains (angina pectoris). When a coronary artery closes up entirely, a part of the heart muscle may die, causing a heart attack (myocardial infarction). CHECKING CHOLESTEROL When your caregiver sends your blood to a lab to be analyzed for cholesterol, a complete lipid (fat) profile may be done. With this test, the total amount of cholesterol and levels of LDL and HDL are determined. Triglycerides are a type of fat that circulates in the blood and can also be used to determine heart disease risk. The list below describes what the numbers should be: Test: Total Cholesterol.  Less than 200 mg/dl.  Test: LDL "bad cholesterol."  Less than 100 mg/dl.   Less than 70 mg/dl if you are at very high risk of a heart attack or sudden cardiac death.  Test: HDL "good cholesterol."  Greater than 50 mg/dl for women.   Greater than 40 mg/dl for men.  Test:  Triglycerides.  Less than 150 mg/dl.  CONTROLLING CHOLESTEROL WITH DIET Although exercise and lifestyle factors are important, your diet is key. That is because certain foods are known to raise cholesterol and others to lower it. The goal is to balance foods for their effect on cholesterol and more importantly, to replace saturated and trans fat with other types of fat, such as monounsaturated fat, polyunsaturated fat, and omega-3 fatty acids. On average, a person should consume no more than 15 to 17 g of saturated fat daily. Saturated and trans fats are considered "bad" fats, and they will raise LDL cholesterol. Saturated fats are primarily found in animal products such as meats, butter, and cream. However, that does not mean you need to sacrifice all your favorite foods. Today, there are good tasting, low-fat, low-cholesterol substitutes for most of the things you like to eat. Choose low-fat or nonfat alternatives. Choose round or loin cuts of red meat, since these types of cuts are lowest in fat and cholesterol. Chicken (without the skin), fish, veal, and ground [place] breast are excellent choices. Eliminate fatty meats, such as hot dogs and salami. Even shellfish have little or no saturated fat. Have a 3 oz (85 g) portion when you eat lean meat, poultry, or fish. Trans fats are also called "partially hydrogenated oils." They are oils that have been scientifically manipulated so that they are solid at room temperature resulting in a longer shelf life and improved taste and texture of foods in which they are added. Trans fats are found in stick margarine, some tub margarines, cookies, crackers, and baked goods.  When baking and cooking, oils are an excellent substitute for butter. The monounsaturated oils are especially beneficial since it is believed they lower LDL and raise HDL. The oils you should avoid entirely are saturated tropical oils, such as coconut and palm.  Remember to eat liberally from food  groups that are naturally free of saturated and trans fat, including fish, fruit, vegetables, beans, grains (barley, rice, couscous, bulgur wheat), and pasta (without cream sauces).  IDENTIFYING FOODS THAT LOWER CHOLESTEROL  Soluble fiber may lower your cholesterol. This type of fiber is found in fruits such as apples, vegetables such as broccoli, potatoes, and carrots, legumes such as beans, peas, and lentils, and grains such as barley. Foods fortified with plant sterols (phytosterol) may also lower cholesterol. You should eat at least 2 g per day of these foods for a cholesterol lowering effect.  Read package labels to identify low-saturated fats, trans fats free, and low-fat foods at the supermarket. Select cheeses that have only 2 to 3 g saturated fat per ounce. Use a heart-healthy tub margarine that is free of trans fats or partially hydrogenated oil. When buying baked goods (cookies, crackers), avoid partially hydrogenated oils. Breads and muffins should be made from whole grains (whole-wheat or whole oat flour, instead of "flour" or "enriched flour"). Buy non-creamy canned soups with reduced salt and no added fats.  FOOD PREPARATION TECHNIQUES  Never deep-fry. If you must fry, either stir-fry, which uses very little fat, or use non-stick cooking sprays. When possible, broil, bake, or roast meats, and steam vegetables. Instead of dressing vegetables with butter or margarine, use lemon and herbs, applesauce and cinnamon (for squash and sweet potatoes), nonfat yogurt, salsa, and low-fat dressings for salads.  LOW-SATURATED FAT / LOW-FAT FOOD SUBSTITUTES Meats / Saturated Fat (g)  Avoid: Steak, marbled (3 oz/85 g) / 11 g   Choose: Steak, lean (3 oz/85 g) / 4 g   Avoid: Hamburger (3 oz/85 g) / 7 g   Choose: Hamburger, lean (3 oz/85 g) / 5 g   Avoid: Ham (3 oz/85 g) / 6 g   Choose: Ham, lean cut (3 oz/85 g) / 2.4 g   Avoid: Chicken, with skin, dark meat (3 oz/85 g) / 4 g   Choose: Chicken,  skin removed, dark meat (3 oz/85 g) / 2 g   Avoid: Chicken, with skin, light meat (3 oz/85 g) / 2.5 g   Choose: Chicken, skin removed, light meat (3 oz/85 g) / 1 g  Dairy / Saturated Fat (g)  Avoid: Whole milk (1 cup) / 5 g   Choose: Low-fat milk, 2% (1 cup) / 3 g   Choose: Low-fat milk, 1% (1 cup) / 1.5 g   Choose: Skim milk (1 cup) / 0.3 g   Avoid: Hard cheese (1 oz/28 g) / 6 g   Choose: Skim milk cheese (1 oz/28 g) / 2 to 3 g   Avoid: Cottage cheese, 4% fat (1 cup) / 6.5 g   Choose: Low-fat cottage cheese, 1% fat (1 cup) / 1.5 g   Avoid: Ice cream (1 cup) / 9 g   Choose: Sherbet (1 cup) / 2.5 g   Choose: Nonfat frozen yogurt (1 cup) / 0.3 g   Choose: Frozen fruit bar / trace   Avoid: Whipped cream (1 tbs) / 3.5 g   Choose: Nondairy whipped topping (1 tbs) / 1 g  Condiments / Saturated Fat (g)  Avoid: Mayonnaise (1 tbs) / 2 g   Choose: Low-fat  mayonnaise (1 tbs) / 1 g   Avoid: Butter (1 tbs) / 7 g   Choose: Extra light margarine (1 tbs) / 1 g   Avoid: Coconut oil (1 tbs) / 11.8 g   Choose: Olive oil (1 tbs) / 1.8 g   Choose: Corn oil (1 tbs) / 1.7 g   Choose: Safflower oil (1 tbs) / 1.2 g   Choose: Sunflower oil (1 tbs) / 1.4 g   Choose: Soybean oil (1 tbs) / 2.4 g   Choose: Canola oil (1 tbs) / 1 g  Document Released: 07/27/2005 Document Revised: 04/08/2011 Document Reviewed: 01/15/2011 [facility] Patient Information 2012 [place], [place].Endoscopy Care After Please read the instructions outlined below and refer to this sheet in the next few weeks. These discharge instructions provide you with general information on caring for yourself after you leave the hospital. Your doctor may also give you specific instructions. While your treatment has been planned according to the most current medical practices available, unavoidable complications occasionally occur. If you have any problems or questions after discharge, please call your doctor. HOME CARE  INSTRUCTIONS Activity  You may resume your regular activity but move at a slower pace for the next 24 hours.   Take frequent rest periods for the next 24 hours.   Walking will help expel (get rid of) the air and reduce the bloated feeling in your abdomen.   No driving for 24 hours (because of the anesthesia (medicine) used during the test).   You may shower.   Do not sign any important legal documents or operate any machinery for 24 hours (because of the anesthesia used during the test).  Nutrition  Drink plenty of fluids.   You may resume your normal diet.   Begin with a light meal and progress to your normal diet.   Avoid alcoholic beverages for 24 hours or as instructed by your caregiver.  Medications You may resume your normal medications unless your caregiver tells you otherwise. What you can expect today  You may experience abdominal discomfort such as a feeling of fullness or "gas" pains.   You may experience a sore throat for 2 to 3 days. This is normal. Gargling with salt water may help this.  Follow-up Your doctor will discuss the results of your test with you. SEEK IMMEDIATE MEDICAL CARE IF:  You have excessive nausea (feeling sick to your stomach) and/or vomiting.   You have severe abdominal pain and distention (swelling).   You have trouble swallowing.   You have a temperature over 100 F (37.8 C).   You have rectal bleeding or vomiting of blood.  Document Released: 03/10/2004 Document Revised: 04/08/2011 Document Reviewed: 09/21/2007 [facility] Patient Information 2012 [place], [place].  Ulcer Disease You have an ulcer. This may be in your stomach (gastric ulcer) or in the first part of your small bowel, the duodenum (duodenal ulcer). An ulcer is a break in the lining of the stomach or duodenum. The ulcer causes erosion into the deeper tissue. CAUSES  The stomach has a lining to protect itself from the acid that digests food. The lining can be damaged in  two main ways:  The Helico Pylori bacteria (H. Pyolori) can infect the lining of the stomach and cause ulcers.   Nonsteroidal, anti-inflammatory medications (NSAIDS) can cause gastric ulcerations.   Smoking tobacco can increase the acid in the stomach. This can lead to ulcers, and will impair healing of ulcers.  Other factors, such as alcohol use and stress may  contribute to ulcer formation. Rarely, a tumor or cancer can cause an ulcer.  SYMPTOMS  The problems (symptoms) of ulcer disease are usually a burning or gnawing of the mid-upper belly (abdomen). This is often worse on an empty stomach and may get better with food. This may be associated with feeling sick to your stomach (nausea), bloating, and vomiting. If the ulcer results in bleeding, it can cause:  Black, tarry stools.   Vomiting of bright red blood.   Vomiting coffee-ground-looking materials.  With severe bleeding, there may be loss of consciousness and shock.  DIAGNOSIS  Learning what is wrong (diagnosis) is usually made based upon your history and an exam. Medications are so effective that further tests may not be necessary. If needed, other tests may include:  Blood tests, x-rays, or heart tests. These are done to be sure that no other conditions are causing your symptoms.   X-rays (Barium studies) such as an upper GI series.   Most commonly, an upper GI endoscopy can confirm your diagnosis. This is when a flexible tube is passed through the mouth (using sedative medication). The tube is used to look at the inside of esophagus, stomach, and small bowel. Abnormal pieces of tissue may be removed to examine under the microscope (biopsy).  TREATMENT  Bleeding from ulcers can usually be treated via endoscopy. Rarely, surgery is needed for ulcers when bleeding cannot be stopped. Surgery is needed if the ulcer goes through the wall of the stomach or duodenum.  After any bleeding is stopped, medications are the main treatment:  If  your ulcer was caused by the bacteria H. Pylori, then you will need antibiotics to kill the infection. Usually, a program involving more than one antibiotic, and a medicine for stomach acid, is prescribed.   Medicine to decrease acid production is used for almost all ulcers. Your caregiver will make a recommendation. They will also tell you how long you must use the medication.   Stop using any medications or substances that may have contributed to your ulcer (alcohol, tobacco, caffeine, for example).   Medications are available that protect the lining of the bowel.  HOME CARE INSTRUCTIONS   Continue regular work and usual activities unless advised otherwise by your caregiver.   Avoid tobacco, alcohol, and caffeine. Tobacco use will decrease and slow the rates of healing.   Avoid foods that seem to aggravate or cause discomfort.   There are many over-the-counter products available to control stomach acid and other symptoms. Discuss these with your caregiver before using them. DO NOT substitute over-the-counter medications for prescription medications without discussing with your caregiver.   Special diets are not usually needed.   Keep any follow-up appointments and blood tests, as directed.  SEEK MEDICAL CARE IF:   Your pain or other ulcer symptoms do not improve within a few days of starting treatment.   You develop diarrhea. This can be a complication of certain treatments.   You have ongoing indigestion or heartburn, even if your main ulcer symptoms are improved.  SEEK IMMEDIATE MEDICAL CARE IF:   You develop bright red, rectal bleeding; dark black, tarry stools; or vomit blood.   You become light-headed, weak, have fainting episodes, or become sweaty, cold and clammy.   You experience severe abdominal pain not controlled by medications. DO NOT take pain medications unless ordered by your caregiver.  Document Released: 05/06/2005 Document Revised: 04/08/2011 Document Reviewed:  03/24/2007 [facility] Patient Information 2012 [place], [place]  Hiatal Hernia A hiatal  hernia occurs when a part of the stomach slides above the diaphragm. The diaphragm is the thin muscle separating the belly (abdomen) from the chest. A hiatal hernia can be something you are born with or develop over time. Hiatal hernias may allow stomach acid to flow back into your esophagus, the tube which carries food from your mouth to your stomach. If this acid causes problems it is called GERD (gastro-esophageal reflux disease).  SYMPTOMS  Common symptoms of GERD are heartburn (burning in your chest). This is worse when lying down or bending over. It may also cause belching and indigestion. Some of the things which make GERD worse are:  Increased weight pushes on stomach making acid rise more easily.   Smoking markedly increases acid production.   Alcohol decreases lower esophageal sphincter pressure (valve between stomach and esophagus), allowing acid from stomach into esophagus.   Late evening meals and going to bed with a full stomach increases pressure.   Anything that causes an increase in acid production.   Lower esophageal sphincter incompetence.  DIAGNOSIS  Hiatal hernia is often diagnosed with x-rays of your stomach and small bowel. This is called an UGI (upper gastrointestinal x-ray). Sometimes a gastroscopic procedure is done. This is a procedure where your caregiver uses a flexible instrument to look into the stomach and small bowel. HOME CARE INSTRUCTIONS   Try to achieve and maintain an ideal body weight.   Avoid drinking alcoholic beverages.   Stop smoking.   Put the head of your bed on 4 to 6 inch blocks. This will keep your head and esophagus higher than your stomach. If you cannot use blocks, sleep with several pillows under your head and shoulders.   Over-the-counter medications will decrease acid production. Your caregiver can also prescribe medications for this. Take as  directed.   1/2 to 1 teaspoon of an antacid taken every hour while awake, with meals and at bedtime, will neutralize acid.   Do not take aspirin, ibuprofen (Advil or Motrin), or other nonsteroidal anti-inflammatory drugs.   Do not wear tight clothing around your chest or stomach.   Eat smaller meals and eat more frequently. This keeps your stomach from getting too full. Eat slowly.   Do not lie down for 2 or 3 hours after eating. Do not eat or drink anything 1 to 2 hours before going to bed.   Avoid caffeine beverages (colas, coffee, cocoa, tea), fatty foods, citrus fruits and all other foods and drinks that contain acid and that seem to increase the problems.   Avoid bending over, especially after eating. Also avoid straining during bowel movements or when urinating or lifting things. Anything that increases the pressure in your belly increases the amount of acid that may be pushed up into your esophagus.  SEEK IMMEDIATE MEDICAL CARE IF:  There is change in location (pain in arms, neck, jaw, teeth or back) of your pain, or the pain is getting worse.   You also experience nausea, vomiting, sweating (diaphoresis), or shortness of breath.   You develop continual vomiting, vomit blood or coffee ground material, have bright red blood in your stools, or have black tarry stools.  Some of these symptoms could signal other problems such as heart disease. MAKE SURE YOU:   Understand these instructions.   Monitor your condition.   Contact your caregiver if you are not doing well or are getting worse.  Document Released: 10/17/2003 Document Revised: 04/08/2011 Document Reviewed: 07/27/2005 [facility] Patient Information 2012 [place], [place].Marland Kitchen

## 2011-10-07 NOTE — Progress Notes (Signed)
REVIEWED.  

## 2011-10-09 ENCOUNTER — Emergency Department (HOSPITAL_COMMUNITY): Payer: Self-pay

## 2011-10-09 ENCOUNTER — Emergency Department (HOSPITAL_COMMUNITY)
Admission: EM | Admit: 2011-10-09 | Discharge: 2011-10-09 | Disposition: A | Discharge status: Home / Self Care | Payer: Self-pay | Attending: Emergency Medicine | Admitting: Emergency Medicine

## 2011-10-09 ENCOUNTER — Telehealth: Payer: Self-pay | Admitting: Gastroenterology

## 2011-10-09 ENCOUNTER — Encounter (HOSPITAL_COMMUNITY): Payer: Self-pay | Admitting: *Deleted

## 2011-10-09 ENCOUNTER — Other Ambulatory Visit: Payer: Self-pay

## 2011-10-09 DIAGNOSIS — R1013 Epigastric pain: Secondary | ICD-10-CM | POA: Insufficient documentation

## 2011-10-09 HISTORY — DX: Diaphragmatic hernia without obstruction or gangrene: K44.9

## 2011-10-09 LAB — COMPREHENSIVE METABOLIC PANEL
ALT: 15 U/L (ref 0–35)
Alkaline Phosphatase: 82 U/L (ref 39–117)
BUN: 14 mg/dL (ref 6–23)
CO2: 27 mEq/L (ref 19–32)
Chloride: 106 mEq/L (ref 96–112)
GFR calc Af Amer: >90 mL/min (ref >90)
GFR calc non Af Amer: >90 mL/min (ref >90)
Glucose, Bld: 91 mg/dL (ref 70–99)
Potassium: 4.1 mEq/L (ref 3.5–5.1)
Sodium: 140 mEq/L (ref 135–145)
Total Bilirubin: 0.4 mg/dL (ref 0.3–1.2)
Total Protein: 6.3 g/dL (ref 6.0–8.3)

## 2011-10-09 LAB — DIFFERENTIAL
Eosinophils Absolute: 0.1 10*3/uL (ref 0.0–0.7)
Lymphocytes Relative: 10 % — ABNORMAL LOW (ref 12–46)
Lymphs Abs: 1 10*3/uL (ref 0.7–4.0)
Monocytes Relative: 5 % (ref 3–12)
Neutrophils Relative %: 84 % — ABNORMAL HIGH (ref 43–77)

## 2011-10-09 LAB — CBC
Hemoglobin: 13.5 g/dL (ref 12.0–15.0)
MCH: 34.7 pg — ABNORMAL HIGH (ref 26.0–34.0)
Platelets: 190 10*3/uL (ref 150–400)
RBC: 3.89 MIL/uL (ref 3.87–5.11)
WBC: 9.9 10*3/uL (ref 4.0–10.5)

## 2011-10-09 LAB — URINALYSIS, ROUTINE W REFLEX MICROSCOPIC
Bilirubin Urine: NEGATIVE
Ketones, ur: NEGATIVE mg/dL
Leukocytes, UA: NEGATIVE
Nitrite: NEGATIVE
Protein, ur: NEGATIVE mg/dL
Urobilinogen, UA: 0.2 mg/dL (ref 0.0–1.0)
pH: 5.5 (ref 5.0–8.0)

## 2011-10-09 MED ORDER — DICYCLOMINE HCL 20 MG PO TABS: 20.0000 mg | ORAL_TABLET | Freq: Four times a day (QID) | ORAL | Status: DC

## 2011-10-09 MED ORDER — ONDANSETRON HCL 4 MG/2ML IJ SOLN
4.0000 mg | Freq: Once | INTRAMUSCULAR | Status: AC
  Administered 2011-10-09: 4 mg via INTRAVENOUS
  Filled 2011-10-09: qty 2

## 2011-10-09 MED ORDER — SODIUM CHLORIDE 0.9 % IV SOLN: 1000.0000 mL | INTRAVENOUS | Status: DC

## 2011-10-09 MED ORDER — SODIUM CHLORIDE 0.9 % IV SOLN
1000.0000 mL | Freq: Once | INTRAVENOUS | Status: AC
  Administered 2011-10-09: 1000 mL via INTRAVENOUS

## 2011-10-09 MED ORDER — HYDROMORPHONE HCL PF 1 MG/ML IJ SOLN
1.0000 mg | Freq: Once | INTRAMUSCULAR | Status: AC
  Administered 2011-10-09: 1 mg via INTRAVENOUS
  Filled 2011-10-09: qty 1

## 2011-10-09 MED ORDER — HYDROCODONE-ACETAMINOPHEN 5-325 MG PO TABS: 1.0000 | ORAL_TABLET | Freq: Four times a day (QID) | ORAL | Status: AC | PRN

## 2011-10-09 NOTE — Telephone Encounter (Signed)
Called pt. She said she started swelling in her stomach yesterday and her abdomen is very hard. The pain almost took her breath last night, but is some better, However, she still rates the pain as an 8 on a scale of 1-10. She said the pain has been constant. No vomiting. No diarrhea, just a very little loose stool this AM. ( EGD was done on 10/06/2011). Per Lorenza Burton, NP pt was told to go to the ED.

## 2011-10-09 NOTE — ED Provider Notes (Signed)
History   This chart was scribed for Celene Kras, MD by Sofie Rower. The patient was seen in room APA08/APA08 and the patient's care was started at 11:50AM.    CSN: [id number]  Arrival date & time 10/09/11  1059   First MD Initiated Contact with Patient 10/09/11 1134      Chief Complaint  Patient presents with  . Abdominal Pain    (Consider location/radiation/quality/duration/timing/severity/associated sxs/prior treatment) HPI  Jasmine Davies is a 46 y.o. female who presents to the Emergency Department complaining of moderate, constant abdominal pain located epigastrically onset yesterday with associated symptoms of pain radiating towards her back. Pt states "her stomach got real tight and really painfull". Pt has hx of gall bladder removal. Pt has a hx of hiatal hernia, diagnosed one week ago. Pt is currently visiting with Dr. Darrick Penna for hiatal hernia.   Pt denies nausea, vomiting, appendix removal .  Past Medical History  Diagnosis Date  . Hiatal hernia     Past Surgical History  Procedure Date  . Club foot release     left foot  . Cholecystectomy   . Tubal ligation     Family History  Problem Relation Age of Onset  . Colon cancer Neg Hx   . Hypertension Mother   . Heart failure Father   . Alcohol abuse Father     History  Substance Use Topics  . Smoking status: Current Everyday Smoker -- 0.5 packs/day    Types: Cigarettes  . Smokeless tobacco: Not on file  . Alcohol Use: Yes     12 pack of beer daily     OB History    Grav Para Term Preterm Abortions TAB SAB Ect Mult Living                  Review of Systems  All other systems reviewed and are negative.    10 Systems reviewed and are negative for acute change except as noted in the HPI.   Allergies  Review of patient's allergies indicates no known allergies.  Home Medications   Current Outpatient Rx  Name Route Sig Dispense Refill  . GOODY HEADACHE PO Oral Take 1 packet by mouth 2 (two) times  daily as needed. For pain    . OMEPRAZOLE 20 MG PO CPDR Oral Take 1 capsule (20 mg total) by mouth 2 (two) times daily. Take 30 minutes before breakfast and 30 minutes before dinner 60 capsule 3    BP 151/90  Pulse 80  Temp(Src) 97.5 F (36.4 C) (Oral)  Resp 18  Ht 4\' 11"  (1.499 m)  Wt 153 lb (69.4 kg)  BMI 30.90 kg/m2  SpO2 100%  Physical Exam  Nursing note and vitals reviewed. Constitutional: She appears well-developed and well-nourished. No distress.       Mildly obese.  HENT:  Head: Normocephalic and atraumatic.  Right Ear: External ear normal.  Left Ear: External ear normal.  Eyes: Conjunctivae are normal. Right eye exhibits no discharge. Left eye exhibits no discharge. No scleral icterus.  Neck: Neck supple. No tracheal deviation present.  Cardiovascular: Normal rate, regular rhythm and intact distal pulses.   Pulmonary/Chest: Effort normal and breath sounds normal. No stridor. No respiratory distress. She has no wheezes. She has no rales.  Abdominal: Soft. Bowel sounds are normal. She exhibits no distension. There is tenderness (epigastric). There is guarding. There is no rebound.  Musculoskeletal: She exhibits no edema and no tenderness.  Neurological: She is alert. She has  normal strength. No sensory deficit. Cranial nerve deficit:  no gross defecits noted. She exhibits normal muscle tone. She displays no seizure activity. Coordination normal.  Skin: Skin is warm and dry. No rash noted.  Psychiatric: She has a normal mood and affect.    ED Course  Procedures (including critical care time)  Date: 10/09/2011  Rate: 76  Rhythm: normal sinus rhythm  QRS Axis: normal  Intervals: normal  ST/T Wave abnormalities: normal  Conduction Disutrbances:none  Narrative Interpretation:   Old EKG Reviewed: unchanged   DIAGNOSTIC STUDIES: Oxygen Saturation is 100% on room air, normal by my interpretation.    COORDINATION OF CARE:   Results for orders placed during the  hospital encounter of 10/09/11  CBC      Component Value Range   WBC 9.9  4.0 - 10.5 (K/uL)   RBC 3.89  3.87 - 5.11 (MIL/uL)   Hemoglobin 13.5  12.0 - 15.0 (g/dL)   HCT 19.1  47.8 - 29.5 (%)   MCV 102.3 (*) 78.0 - 100.0 (fL)   MCH 34.7 (*) 26.0 - 34.0 (pg)   MCHC 33.9  30.0 - 36.0 (g/dL)   RDW 62.1  30.8 - 65.7 (%)   Platelets 190  150 - 400 (K/uL)  DIFFERENTIAL      Component Value Range   Neutrophils Relative 84 (*) 43 - 77 (%)   Neutro Abs 8.3 (*) 1.7 - 7.7 (K/uL)   Lymphocytes Relative 10 (*) 12 - 46 (%)   Lymphs Abs 1.0  0.7 - 4.0 (K/uL)   Monocytes Relative 5  3 - 12 (%)   Monocytes Absolute 0.5  0.1 - 1.0 (K/uL)   Eosinophils Relative 1  0 - 5 (%)   Eosinophils Absolute 0.1  0.0 - 0.7 (K/uL)   Basophils Relative 0  0 - 1 (%)   Basophils Absolute 0.0  0.0 - 0.1 (K/uL)  COMPREHENSIVE METABOLIC PANEL      Component Value Range   Sodium 140  135 - 145 (mEq/L)   Potassium 4.1  3.5 - 5.1 (mEq/L)   Chloride 106  96 - 112 (mEq/L)   CO2 27  19 - 32 (mEq/L)   Glucose, Bld 91  70 - 99 (mg/dL)   BUN 14  6 - 23 (mg/dL)   Creatinine, Ser 8.46  0.50 - 1.10 (mg/dL)   Calcium 7.8 (*) 8.4 - 10.5 (mg/dL)   Total Protein 6.3  6.0 - 8.3 (g/dL)   Albumin 3.3 (*) 3.5 - 5.2 (g/dL)   AST 17  0 - 37 (U/L)   ALT 15  0 - 35 (U/L)   Alkaline Phosphatase 82  39 - 117 (U/L)   Total Bilirubin 0.4  0.3 - 1.2 (mg/dL)   GFR calc non Af Amer >90  >90 (mL/min)   GFR calc Af Amer >90  >90 (mL/min)  LIPASE, BLOOD      Component Value Range   Lipase 50  11 - 59 (U/L)    Dg Abd Acute W/chest  10/09/2011  *RADIOLOGY REPORT*  Clinical Data: Abdominal pain.  Hiatal hernia.  3 days status post endoscopy.  ACUTE ABDOMEN SERIES (ABDOMEN 2 VIEW & CHEST 1 VIEW)  Comparison: 09/25/2011  Findings: Gas filled small and large bowel loops are seen scattered air fluid levels.  There is no evidence of dilated bowel loops or free air.  Surgical clips are seen from prior cholecystectomy. No radiopaque calculi  identified.  Heart size and mediastinal contours are normal.  Both lungs  are clear.  No evidence of pleural effusion.  IMPRESSION:  1.  Nonspecific, nonobstructive bowel gas pattern. 2.  No active cardiopulmonary disease.  Original Report Authenticated By: Danae Orleans, M.D.      1. Epigastric pain      11:54AM- EDP at bedside discusses treatment plan concerning urine and blood work.   MDM  Patient is feeling better after treatment with medications for pain and nausea. She has already had her gallbladder removed. She had a recent endoscopy that showed a hiatal hernia the patient was told that this was causing her episodes of pain. At this time there does not appear to be evidence of an acute emergency medical condition.   I personally performed the services described in this documentation, which was scribed in my presence.  The recorded information has been reviewed and considered.    Celene Kras, MD 10/09/11 [phone]

## 2011-10-09 NOTE — Discharge Instructions (Signed)

## 2011-10-09 NOTE — Telephone Encounter (Signed)
Pt called this morning to let us know that she is still hurting from having her procedure and would like to speak with nurse. She can be reached at [phone]

## 2011-10-09 NOTE — ED Notes (Signed)
Epigastric pain, with Hx of Hiatal hernia, feels it is the same pain with pain thru to back, No N/V.

## 2011-10-09 NOTE — ED Notes (Signed)
Patient with no complaints at this time. Respirations even and unlabored. Skin warm/dry. Discharge instructions reviewed with patient at this time. Patient given opportunity to voice concerns/ask questions. IV removed per policy and band-aid applied to site. Patient discharged at this time and left Emergency Department with steady gait.  

## 2011-10-09 NOTE — Telephone Encounter (Signed)
Agree 

## 2011-10-13 ENCOUNTER — Encounter (HOSPITAL_COMMUNITY): Payer: Self-pay | Admitting: Gastroenterology

## 2011-10-14 ENCOUNTER — Telehealth: Payer: Self-pay | Admitting: Gastroenterology

## 2011-10-14 NOTE — Telephone Encounter (Signed)
NO PCP on file reminder is in the computer

## 2011-10-14 NOTE — Telephone Encounter (Signed)
Please call pt. Jasmine Davies stomach Bx shows gastritis. Continue OMEPRAZOLE BID. SHE SHOULD AVOID ALCOHOL AND BC POWDERS. OPV IN 3 MOS E 30. SHE WILL NEED REPEAT EGD WITH BIOPSIES.

## 2011-10-14 NOTE — Telephone Encounter (Signed)
LMOM to call.

## 2011-10-14 NOTE — Telephone Encounter (Signed)
REVIEWED PATH WITH DR. HILLARD. Changes can be seen in IBS but pt has nl CT a/p.

## 2011-10-15 NOTE — Telephone Encounter (Signed)
LM for pt to call

## 2011-10-16 NOTE — Telephone Encounter (Signed)
L/M to call.

## 2011-10-16 NOTE — Telephone Encounter (Signed)
Pt called and was informed.  

## 2011-12-08 ENCOUNTER — Encounter: Payer: Self-pay | Admitting: Gastroenterology

## 2011-12-09 ENCOUNTER — Telehealth: Payer: Self-pay | Admitting: Gastroenterology

## 2011-12-09 ENCOUNTER — Encounter: Payer: Self-pay | Admitting: Gastroenterology

## 2011-12-09 ENCOUNTER — Ambulatory Visit (INDEPENDENT_AMBULATORY_CARE_PROVIDER_SITE_OTHER): Payer: Self-pay | Admitting: Gastroenterology

## 2011-12-09 VITALS — BP 147/90 | HR 81 | Temp 97.8°F | Ht 59.0 in | Wt 154.6 lb

## 2011-12-09 DIAGNOSIS — R141 Gas pain: Secondary | ICD-10-CM

## 2011-12-09 DIAGNOSIS — K279 Peptic ulcer, site unspecified, unspecified as acute or chronic, without hemorrhage or perforation: Secondary | ICD-10-CM | POA: Insufficient documentation

## 2011-12-09 DIAGNOSIS — R140 Abdominal distension (gaseous): Secondary | ICD-10-CM

## 2011-12-09 NOTE — Assessment & Plan Note (Signed)
Vague reports of abdominal bloating, states feels pregnant by end of day. Question underlying IBS. Notes postprandial loose stools which are chronic. No rectal bleeding. Institute high fiber diet, lactose free,  add probiotic. Check thyroid status and celiac panel. Consider further work-up if continues to be an issue despite dietary modification.

## 2011-12-09 NOTE — Telephone Encounter (Signed)
Lubertha Basque called for JL to let her know that patient has 100% [place] assistance until 04/27/2012

## 2011-12-09 NOTE — Assessment & Plan Note (Signed)
46 year old with recent multiple antral ulcers on EGD Feb 2013, likely secondary to ETOH use, Goody's powders. Path report notes these changes could be seen in IBD; she has had a normal CT. This is less likely. CONTINUES TO TAKE GOODY'S POWDERS. QUIT ETOH 2 mos ago.   Counseled on avoidance of aspirin powders.  Proceed with upper endoscopy in the near future with Dr. Darrick Penna to document improvement/healing. The risks, benefits, and alternatives have been discussed in detail with patient. They have stated understanding and desire to proceed.  Continue Prilosec BID

## 2011-12-09 NOTE — Progress Notes (Signed)
No PCP on file 

## 2011-12-09 NOTE — Progress Notes (Signed)
Primary Care Physician:  No primary provider on file. Primary Gastroenterologist: Dr. Darrick Penna   Chief Complaint  Patient presents with  . Bloated    HPI:   46 year old female, returns today to set up repeat EGD to assess for healing of ulcers. EGD in Feb 2013 with multiple antral ulcers, small hiatal hernia, duodenitis. Biopsy with changes that can be seen with IBD but had normal CT. Likely secondary to ETOH abuse, Goody's powders.  Pt denies any ETOH X 2 mos. Continues to take Goody's powders AT LEAST ONCE PER DAY FOR ABDOMINAL PAIN. On Prilosec BID. 2-3 postprandial BMs per day, no rectal bleeding. Complains of bloating as day goes on, feels/looks pregnant by end of day. Uncomfortable.  WT STABLE FROM LAST VISIT.   Past Medical History  Diagnosis Date  . Hiatal hernia   . PUD (peptic ulcer disease)     ? ETOH and ASA use    Past Surgical History  Procedure Date  . Club foot release     left foot  . Cholecystectomy   . Tubal ligation   . Esophagogastroduodenoscopy 10/06/2011    ulcers,multiple in antrum and duodenitis/hiatal hernia    Current Outpatient Prescriptions  Medication Sig Dispense Refill  . Aspirin-Acetaminophen-Caffeine (GOODY HEADACHE PO) Take 1 packet by mouth 2 (two) times daily as needed. For pain      . omeprazole (PRILOSEC) 20 MG capsule Take 1 capsule (20 mg total) by mouth 2 (two) times daily. Take 30 minutes before breakfast and 30 minutes before dinner  60 capsule  3  . dicyclomine (BENTYL) 20 MG tablet Take 1 tablet (20 mg total) by mouth every 6 (six) hours.  20 tablet  0    Allergies as of 12/09/2011  . (No Known Allergies)    Family History  Problem Relation Age of Onset  . Colon cancer Neg Hx   . Hypertension Mother   . Heart failure Father   . Alcohol abuse Father     History   Social History  . Marital Status: Divorced    Spouse Name: N/A    Number of Children: N/A  . Years of Education: N/A   Social History Main Topics  .  Smoking status: Current Everyday Smoker -- 0.5 packs/day    Types: Cigarettes  . Smokeless tobacco: None  . Alcohol Use: Yes     12 pack of beer daily   . Drug Use: No  . Sexually Active: Yes    Birth Control/ Protection: None, Surgical   Other Topics Concern  . None   Social History Narrative  . None    Review of Systems: Gen: Denies fever, chills, anorexia. Denies fatigue, weakness, weight loss.  CV: Denies chest pain, palpitations, syncope, peripheral edema, and claudication. Resp: Denies dyspnea at rest, cough, wheezing, coughing up blood, and pleurisy. GI: Denies vomiting blood, jaundice, and fecal incontinence.   Denies dysphagia or odynophagia. Derm: Denies rash, itching, dry skin Psych: Denies depression, anxiety, memory loss, confusion. No homicidal or suicidal ideation.  Heme: Denies bruising, bleeding, and enlarged lymph nodes.  Physical Exam: BP 147/90  Pulse 81  Temp(Src) 97.8 F (36.6 C) (Temporal)  Ht 4\' 11"  (1.499 m)  Wt 154 lb 9.6 oz (70.126 kg)  BMI 31.23 kg/m2 General:   Alert and oriented. No distress noted. Pleasant and cooperative.  Head:  Normocephalic and atraumatic. Eyes:  Conjuctiva clear without scleral icterus. Heart:  S1, S2 present without murmurs, rubs, or gallops. Regular rate and rhythm. Abdomen:  +  BS, soft, TTP epigastric region and non-distended. No rebound or guarding. No HSM or masses noted. Msk:  Symmetrical without gross deformities. Left club foot repait Extremities:  Without edema. Neurologic:  Alert and  oriented x4;  grossly normal neurologically. Skin:  Intact without significant lesions or rashes. Psych:  Alert and cooperative. Normal mood and affect.

## 2011-12-09 NOTE — Patient Instructions (Signed)
Please have blood work completed. We will call you with these results.  Start taking a probiotic daily. We have given you samples. You may use a generic brand, which is over-the-counter. Other examples are Digestive [employer]age, [facility], Align, [place].  We have set you up for an upper endoscopy in the near future. STOP GOODY'S powders. This will not allow the ulcers to heal. GREAT JOB on staying away from alcohol!!! Continue to keep up the good work in that area.  Contact us with any worsening pain in the meantime.

## 2011-12-10 ENCOUNTER — Other Ambulatory Visit: Payer: Self-pay | Admitting: Gastroenterology

## 2011-12-10 DIAGNOSIS — K279 Peptic ulcer, site unspecified, unspecified as acute or chronic, without hemorrhage or perforation: Secondary | ICD-10-CM

## 2011-12-14 MED ORDER — SODIUM CHLORIDE 0.45 % IV SOLN
Freq: Once | INTRAVENOUS | Status: AC
  Administered 2011-12-15: 1000 mL via INTRAVENOUS

## 2011-12-15 ENCOUNTER — Ambulatory Visit (HOSPITAL_COMMUNITY)
Admission: RE | Admit: 2011-12-15 | Discharge: 2011-12-15 | Disposition: A | Discharge status: Home / Self Care | Payer: Self-pay | Source: Ambulatory Visit | Attending: Gastroenterology | Admitting: Gastroenterology

## 2011-12-15 ENCOUNTER — Encounter (HOSPITAL_COMMUNITY)
Admission: RE | Disposition: A | Discharge status: Home / Self Care | Payer: Self-pay | Source: Ambulatory Visit | Attending: Gastroenterology

## 2011-12-15 ENCOUNTER — Encounter (HOSPITAL_COMMUNITY): Payer: Self-pay | Admitting: *Deleted

## 2011-12-15 DIAGNOSIS — K449 Diaphragmatic hernia without obstruction or gangrene: Secondary | ICD-10-CM

## 2011-12-15 DIAGNOSIS — K259 Gastric ulcer, unspecified as acute or chronic, without hemorrhage or perforation: Secondary | ICD-10-CM

## 2011-12-15 DIAGNOSIS — K2990 Gastroduodenitis, unspecified, without bleeding: Secondary | ICD-10-CM

## 2011-12-15 DIAGNOSIS — K2970 Gastritis, unspecified, without bleeding: Secondary | ICD-10-CM

## 2011-12-15 DIAGNOSIS — K279 Peptic ulcer, site unspecified, unspecified as acute or chronic, without hemorrhage or perforation: Secondary | ICD-10-CM

## 2011-12-15 HISTORY — PX: ESOPHAGOGASTRODUODENOSCOPY: SHX5428

## 2011-12-15 SURGERY — EGD (ESOPHAGOGASTRODUODENOSCOPY)
Anesthesia: Moderate Sedation

## 2011-12-15 MED ORDER — SODIUM CHLORIDE 0.9 % IJ SOLN
INTRAMUSCULAR | Status: AC
  Filled 2011-12-15: qty 10

## 2011-12-15 MED ORDER — BUTAMBEN-TETRACAINE-BENZOCAINE 2-2-14 % EX AERO
INHALATION_SPRAY | CUTANEOUS | Status: DC | PRN
Start: 2011-12-15 — End: 2011-12-15
  Administered 2011-12-15: 1 via TOPICAL

## 2011-12-15 MED ORDER — MIDAZOLAM HCL 5 MG/5ML IJ SOLN
INTRAMUSCULAR | Status: DC | PRN
  Administered 2011-12-15: 2 mg via INTRAVENOUS
  Administered 2011-12-15 (×2): 1 mg via INTRAVENOUS

## 2011-12-15 MED ORDER — MEPERIDINE HCL 100 MG/ML IJ SOLN
INTRAMUSCULAR | Status: DC | PRN
  Administered 2011-12-15: 25 mg via INTRAVENOUS
  Administered 2011-12-15: 50 mg via INTRAVENOUS

## 2011-12-15 MED ORDER — PROMETHAZINE HCL 25 MG/ML IJ SOLN
INTRAMUSCULAR | Status: DC | PRN
  Administered 2011-12-15: 12.5 mg via INTRAVENOUS

## 2011-12-15 MED ORDER — STERILE WATER FOR IRRIGATION IR SOLN
Status: DC | PRN
  Administered 2011-12-15: 15:00:00

## 2011-12-15 MED ORDER — PROMETHAZINE HCL 25 MG/ML IJ SOLN
INTRAMUSCULAR | Status: AC
  Filled 2011-12-15: qty 1

## 2011-12-15 MED ORDER — MIDAZOLAM HCL 5 MG/5ML IJ SOLN
INTRAMUSCULAR | Status: AC
  Filled 2011-12-15: qty 10

## 2011-12-15 MED ORDER — MEPERIDINE HCL 100 MG/ML IJ SOLN
INTRAMUSCULAR | Status: AC
  Filled 2011-12-15: qty 2

## 2011-12-15 MED ORDER — OMEPRAZOLE 20 MG PO CPDR: 20.0000 mg | DELAYED_RELEASE_CAPSULE | Freq: Two times a day (BID) | ORAL | Status: DC

## 2011-12-15 NOTE — Discharge Instructions (Signed)
Your ULCERS have NOT healed DUE TO YOUR CONTINUED USE OF BC POWDERS. YOU HAVE 2 ULCERS. You still have erosions IN YOUR STOMACH AS WELL. I biopsied your stomach AND ULCERS.  CONTINUE OMEPRAZOLE twice daily FOREVER. AVOID ASPIRIN AND NSAIDS FOR 1 MONTH.  CONTINUED USE OF ASA AND NSAIDS MAY RESULT SIN TEH NEED FOR SURGERY OR DEATH. YOUR BIOPSIES SHOULD BE BACK IN 7 DAYS. FOLLOW UP IN 6 MOS.    UPPER ENDOSCOPY AFTER CARE Read the instructions outlined below and refer to this sheet in the next week. These discharge instructions provide you with general information on caring for yourself after you leave the hospital. While your treatment has been planned according to the most current medical practices available, unavoidable complications occasionally occur. If you have any problems or questions after discharge, call DR. Kynlea Blackston, [phone].  ACTIVITY  You may resume your regular activity, but move at a slower pace for the next 24 hours.   Take frequent rest periods for the next 24 hours.   Walking will help get rid of the air and reduce the bloated feeling in your belly (abdomen).   No driving for 24 hours (because of the medicine (anesthesia) used during the test).   You may shower.   Do not sign any important legal documents or operate any machinery for 24 hours (because of the anesthesia used during the test).    NUTRITION  Drink plenty of fluids.   You may resume your normal diet as instructed by your doctor.   Begin with a light meal and progress to your normal diet. Heavy or fried foods are harder to digest and may make you feel sick to your stomach (nauseated).   Avoid alcoholic beverages for 24 hours or as instructed.    MEDICATIONS  You may resume your normal medications.   WHAT YOU CAN EXPECT TODAY  Some feelings of bloating in the abdomen.   Passage of more gas than usual.    IF YOU HAD A BIOPSY TAKEN DURING THE UPPER ENDOSCOPY:  No aspirin products for 14  days.   Eat a soft diet IF YOU HAVE NAUSEA, BLOATING, ABDOMINAL PAIN, OR VOMITING.    FINDING OUT THE RESULTS OF YOUR TEST Not all test results are available during your visit. DR. Darrick Penna WILL CALL YOU WITHIN 7 DAYS OF YOUR PROCEDUE WITH YOUR RESULTS. Do not assume everything is normal if you have not heard from DR. Tramaine Sauls IN ONE WEEK, CALL HER OFFICE AT [phone].  SEEK IMMEDIATE MEDICAL ATTENTION AND CALL THE OFFICE: [phone] IF:  You have more than a spotting of blood in your stool.   Your belly is swollen (abdominal distention).   You are nauseated or vomiting.   You have a temperature over 101F.   You have abdominal pain or discomfort that is severe or gets worse throughout the day.  Ulcer Disease (Peptic Ulcer, Gastric Ulcer, Duodenal Ulcer) You have an ulcer. This may be in your stomach (gastric ulcer) or in the first part of your small bowel, the duodenum (duodenal ulcer). An ulcer is a break in the lining of the stomach or duodenum. The ulcer causes erosion into the deeper tissue.  CAUSES The stomach has a lining to protect itself from the acid that digests food. The lining can be damaged in two main ways:  The Helico Pylori bacteria (H. Pyolori) can infect the lining of the stomach and cause ulcers.   ASPIRIN/Nonsteroidal, anti-inflammatory medications (NSAIDS) can cause gastric ulcerations.   Smoking  tobacco can increase the acid in the stomach. This can lead to ulcers, and will impair healing of ulcers.  Other factors, such as alcohol use and stress may contribute to ulcer formation. Rarely, a tumor or cancer can cause an ulcer.   SYMPTOMS The problems (symptoms) of ulcer disease are usually a burning or gnawing of the mid-upper belly (abdomen). This is often worse on an empty stomach and may get better with food. This may be associated with feeling sick to your stomach (nausea), bloating, and vomiting. If the ulcer results in bleeding, it can cause:  Black,  tarry stools.   Vomiting of bright red blood.   Vomiting coffee-ground-looking materials.   SEVERE ANEMIA   HOME CARE INSTRUCTIONS Continue regular work and usual activities unless advised otherwise by your caregiver.  Avoid tobacco, alcohol, and caffeine. Tobacco use will decrease and slow the rates of healing.   Avoid foods that seem to aggravate or cause discomfort.   There are many over-the-counter products available to control stomach acid and other symptoms.   Special diets are not usually needed.   Keep any follow-up appointments and blood tests, as directed.   Peptic Ulcers Ulcers are small, open, and painful sores that form in the lining of the stomach (gastric ulcers). They can also form in the first part of the small intestine (duodenal ulcers). A peptic ulcer describes both types of ulcers. Ulcers often heal by taking medicine or changing what you eat. Surgery is only needed if the pain cannot be controlled or if tearing, blockage, or bleeding is found. HOME CARE  Stop smoking.   Avoid alcohol.   Avoid aspirin and drugs that lessen puffiness (swelling) and soreness.   Eat regular, healthy meals.   Avoid foods that bother you.   Only take medicine as told by your doctor.   Always ask your doctor first before switching brands of antacid medicine.  GET HELP RIGHT AWAY IF:  You have bright red blood in your throw up (vomit).   You have bloody, tarry, or black looking poop (stool).   You feel weak, tired, or pass out (faint).   You have sudden belly (abdominal) pain that hurts really bad.   You have bad pain and keep throwing up.  MAKE SURE YOU:   Understand these instructions.   Will watch your condition.   Will get help right away if you are not doing well or get worse.  Document Released: 10/21/2009 Document Revised: 07/16/2011 Document Reviewed: 10/21/2009 [facility] Patient Information 2012 [place], [place].

## 2011-12-15 NOTE — H&P (Signed)
  Primary Care Physician:  No primary provider on file. Primary Gastroenterologist:  Dr. Darrick Penna  Pre-Procedure History & Physical: HPI:  Jasmine Davies is a 46 y.o. female here for peptic ulcer disease.   Past Medical History  Diagnosis Date  . Hiatal hernia   . PUD (peptic ulcer disease)     ? ETOH and ASA use    Past Surgical History  Procedure Date  . Club foot release     left foot  . Cholecystectomy   . Tubal ligation   . Esophagogastroduodenoscopy 10/06/2011    ulcers,multiple in antrum and duodenitis/hiatal hernia    Prior to Admission medications   Medication Sig Start Date End Date Taking? Authorizing Provider  Aspirin-Acetaminophen-Caffeine (GOODY HEADACHE PO) Take 1 packet by mouth 2 (two) times daily as needed. For pain   Yes Historical Provider, MD  omeprazole (PRILOSEC) 20 MG capsule Take 1 capsule (20 mg total) by mouth 2 (two) times daily. Take 30 minutes before breakfast and 30 minutes before dinner 10/01/11 09/30/12 Yes Nira Retort, NP  Probiotic Product (ALIGN PO) Take by mouth.   Yes Historical Provider, MD  dicyclomine (BENTYL) 20 MG tablet Take 1 tablet (20 mg total) by mouth every 6 (six) hours. 10/09/11 10/08/12  Celene Kras, MD    Allergies as of 12/10/2011  . (No Known Allergies)    Family History  Problem Relation Age of Onset  . Colon cancer Neg Hx   . Hypertension Mother   . Heart failure Father   . Alcohol abuse Father     History   Social History  . Marital Status: Divorced    Spouse Name: N/A    Number of Children: N/A  . Years of Education: N/A   Occupational History  . Not on file.   Social History Main Topics  . Smoking status: Current Everyday Smoker -- 0.5 packs/day    Types: Cigarettes  . Smokeless tobacco: Not on file  . Alcohol Use: Yes     12 pack of beer daily   . Drug Use: No  . Sexually Active: Yes    Birth Control/ Protection: None, Surgical   Other Topics Concern  . Not on file   Social History Narrative  . No  narrative on file    Review of Systems: See HPI, otherwise negative ROS   Physical Exam: BP 134/84  Pulse 75  Temp(Src) 98 F (36.7 C) (Oral)  Resp 18  Ht 4\' 11"  (1.499 m)  Wt 154 lb (69.854 kg)  BMI 31.10 kg/m2  SpO2 99% General:   Alert,  pleasant and cooperative in NAD Head:  Normocephalic and atraumatic. Neck:  Supple;  Lungs:  Clear throughout to auscultation.    Heart:  Regular rate and rhythm. Abdomen:  Soft, nontender and nondistended. Normal bowel sounds, without guarding, and without rebound.   Neurologic:  Alert and  oriented x4;  grossly normal neurologically.  Impression/Plan:     PUD  PLAN:  REPEAT EGD TO CONFIRM ULCERS ARE HEALED.

## 2011-12-15 NOTE — Op Note (Signed)
[facility] [address] [place], [place]  [address]  ENDOSCOPY PROCEDURE REPORT  PATIENT:  Jasmine, Davies  MR#:  [id number] BIRTHDATE:  27-Oct-1965, 45 yrs. old  GENDER:  female  ENDOSCOPIST:  Jonette Eva, MD Referred by:  PROCEDURE DATE:  12/15/2011 PROCEDURE:  EGD with biopsy, [id number] ASA CLASS: INDICATIONS:  PUD LAST USED ETOH 2 MOS AGO. LAST USED BC POWDERS 3 DAYS AGO.  MEDICATIONS:   Demerol 75 mg IV, Versed 4 mg IV, promethazine (Phenergan) 12.5 mg IV TOPICAL ANESTHETIC:  Cetacaine Spray  DESCRIPTION OF PROCEDURE:     Physical exam was performed. Informed consent was obtained from the patient after explaining the benefits, risks, and alternatives to the procedure.  The patient was connected to the monitor and placed in the left lateral position.  Continuous oxygen was provided by nasal cannula and IV medicine administered through an indwelling cannula.  After administration of sedation, the patient's esophagus was intubated and the [id number] ([id number]) endoscope was advanced under direct visualization to the second portion of the duodenum.  The scope was removed slowly by carefully examining the color, texture, anatomy, and integrity of the mucosa on the way out.  The patient was recovered in endoscopy and discharged home in satisfactory condition. <<PROCEDUREIMAGES>>  NL ESOPHAGUS. NO BARRETT'S. Moderate gastritis was found & AS WELL TWO 3 MM ulcers were found in the antrum. BOTH AREA BIOPSIED VIA COLD FORCEPS  A 2-3 CM hiatal hernia was found. NL DUODENUM.  COMPLICATIONS:    None  ENDOSCOPIC IMPRESSION: 1) Moderate gastritis 2) Hiatal hernia 3) PUD IMPROVED BUT NOT RESOLVED DUE TO ONGOING ASA USE RECOMMENDATIONS: AVOID ASA AND NSAIDS FOR 1 MONTH. CONTINUED USE MAY RESULTS IN SURGERY OR DEATH. OMP BID AVOID ETOH OPV IN 6 MOS  REPEAT EXAM:  No  ______________________________ Jonette Eva, MD  CC:  n. eSIGNED:   Hayde Kilgour at 12/15/2011 04:22  PM  Crista Elliot, [phone]

## 2011-12-24 ENCOUNTER — Telehealth: Payer: Self-pay | Admitting: Gastroenterology

## 2011-12-24 NOTE — Telephone Encounter (Signed)
LMOM to call.

## 2011-12-24 NOTE — Telephone Encounter (Signed)
Please call pt. HER stomach Bx shows gastritis/ULCERS DUE TO HER USING GOODY POWDERS. Continue OMP 30 MINUTES PRIOR TO MEALS BID. SHE SHOULD AVOID USING GOODY POWDERS. CONITNUED USE COULD RESULTS IN THE NEED FOR SURGERY OR DEATH. OPV IN 6 MOS W/ AS E 30.

## 2011-12-25 NOTE — Telephone Encounter (Signed)
No PCP on file 

## 2011-12-28 ENCOUNTER — Encounter (HOSPITAL_COMMUNITY): Payer: Self-pay | Admitting: Gastroenterology

## 2011-12-29 ENCOUNTER — Telehealth: Payer: Self-pay | Admitting: Gastroenterology

## 2011-12-29 ENCOUNTER — Other Ambulatory Visit: Payer: Self-pay | Admitting: Gastroenterology

## 2011-12-29 NOTE — Telephone Encounter (Signed)
Pt returned your call- she can be reached at [phone]

## 2011-12-29 NOTE — Telephone Encounter (Signed)
Called and informed pt.  

## 2011-12-29 NOTE — Telephone Encounter (Signed)
Called and informed pt. ( See phone note of 12/24/2011 )

## 2011-12-29 NOTE — Telephone Encounter (Signed)
L/M to call.

## 2011-12-30 LAB — TSH: TSH: 1.574 u[IU]/mL (ref 0.350–4.500)

## 2012-01-01 NOTE — Telephone Encounter (Signed)
Reminder in epic to follow up in 6 months with AS per [facility]

## 2012-02-15 ENCOUNTER — Emergency Department (HOSPITAL_COMMUNITY)
Admission: EM | Admit: 2012-02-15 | Discharge: 2012-02-15 | Disposition: A | Discharge status: Home / Self Care | Payer: Self-pay | Attending: Emergency Medicine | Admitting: Emergency Medicine

## 2012-02-15 ENCOUNTER — Encounter (HOSPITAL_COMMUNITY): Payer: Self-pay | Admitting: *Deleted

## 2012-02-15 ENCOUNTER — Emergency Department (HOSPITAL_COMMUNITY): Payer: Self-pay

## 2012-02-15 DIAGNOSIS — Z9089 Acquired absence of other organs: Secondary | ICD-10-CM | POA: Insufficient documentation

## 2012-02-15 DIAGNOSIS — M25579 Pain in unspecified ankle and joints of unspecified foot: Secondary | ICD-10-CM | POA: Insufficient documentation

## 2012-02-15 DIAGNOSIS — F17200 Nicotine dependence, unspecified, uncomplicated: Secondary | ICD-10-CM | POA: Insufficient documentation

## 2012-02-15 DIAGNOSIS — M129 Arthropathy, unspecified: Secondary | ICD-10-CM | POA: Insufficient documentation

## 2012-02-15 DIAGNOSIS — G8929 Other chronic pain: Secondary | ICD-10-CM | POA: Insufficient documentation

## 2012-02-15 DIAGNOSIS — M1990 Unspecified osteoarthritis, unspecified site: Secondary | ICD-10-CM

## 2012-02-15 MED ORDER — HYDROCODONE-ACETAMINOPHEN 5-325 MG PO TABS: 1.0000 | ORAL_TABLET | Freq: Four times a day (QID) | ORAL | Status: AC | PRN

## 2012-02-15 MED ORDER — HYDROCODONE-ACETAMINOPHEN 5-325 MG PO TABS
1.0000 | ORAL_TABLET | Freq: Once | ORAL | Status: AC
  Administered 2012-02-15: 1 via ORAL

## 2012-02-15 MED ORDER — HYDROCODONE-ACETAMINOPHEN 5-325 MG PO TABS
ORAL_TABLET | ORAL | Status: AC
  Administered 2012-02-15: 1 via ORAL
  Filled 2012-02-15: qty 1

## 2012-02-15 NOTE — ED Notes (Addendum)
Onset this am, getting OOB and lt foot "gave out on me"  Also says her rt low back hurts.  Has chronic pain in lt foot.

## 2012-02-15 NOTE — ED Notes (Signed)
Pt c/o lower back pain and chronic left foot pain.

## 2012-02-15 NOTE — ED Provider Notes (Signed)
History     CSN: [id number]  Arrival date & time 02/15/12  1348   First MD Initiated Contact with Patient 02/15/12 1419      Chief Complaint  Patient presents with  . Foot Pain    (Consider location/radiation/quality/duration/timing/severity/associated sxs/prior treatment) HPI Comments: Pt has a club foot.  States it has been hurting ~ 4 months.  Hurting more recently.  No known injury.  Took aleve 2 days ago with no relief.  No PCP   Patient is a 46 y.o. female presenting with lower extremity pain. The history is provided by the patient. No language interpreter was used.  Foot Pain This is a new problem. The problem occurs constantly. The problem has been gradually worsening. Pertinent negatives include no chills, fever, numbness or weakness. The symptoms are aggravated by walking and standing. She has tried NSAIDs for the symptoms. The treatment provided no relief.    Past Medical History  Diagnosis Date  . Hiatal hernia   . PUD (peptic ulcer disease)     ? ETOH and ASA use    Past Surgical History  Procedure Date  . Club foot release     left foot  . Cholecystectomy   . Tubal ligation   . Esophagogastroduodenoscopy 10/06/2011    ulcers,multiple in antrum and duodenitis/hiatal hernia  . Esophagogastroduodenoscopy 12/15/2011    Procedure: ESOPHAGOGASTRODUODENOSCOPY (EGD);  Surgeon: West Bali, MD;  Location: [facility];  Service: Endoscopy;  Laterality: N/A;  1:30    Family History  Problem Relation Age of Onset  . Colon cancer Neg Hx   . Hypertension Mother   . Heart failure Father   . Alcohol abuse Father     History  Substance Use Topics  . Smoking status: Current Everyday Smoker -- 0.5 packs/day    Types: Cigarettes  . Smokeless tobacco: Not on file  . Alcohol Use: Yes     none in 4 mos    OB History    Grav Para Term Preterm Abortions TAB SAB Ect Mult Living                  Review of Systems  Constitutional: Negative for fever and chills.    Musculoskeletal:       Foot pain   Neurological: Negative for weakness and numbness.  All other systems reviewed and are negative.    Allergies  Review of patient's allergies indicates no known allergies.  Home Medications   Current Outpatient Rx  Name Route Sig Dispense Refill  . IBUPROFEN 200 MG PO TABS Oral Take 800 mg by mouth every 6 (six) hours as needed. Pain    . NAPROXEN SODIUM 220 MG PO TABS Oral Take 220 mg by mouth 2 (two) times daily as needed. Pain    . OMEPRAZOLE 20 MG PO CPDR Oral Take 20 mg by mouth daily as needed. Take 30 minutes before breakfast and 30 minutes before dinner      BP 105/67  Pulse 87  Temp 97.7 F (36.5 C) (Oral)  Resp 20  Ht 4\' 11"  (1.499 m)  Wt 154 lb (69.854 kg)  BMI 31.10 kg/m2  SpO2 100%  Physical Exam  Nursing note and vitals reviewed. Constitutional: She is oriented to person, place, and time. She appears well-developed and well-nourished. No distress.  HENT:  Head: Normocephalic and atraumatic.  Eyes: EOM are normal.  Neck: Normal range of motion.  Cardiovascular: Normal rate, regular rhythm and normal heart sounds.   Pulmonary/Chest: Effort  normal and breath sounds normal.  Abdominal: Soft. She exhibits no distension. There is no tenderness.  Musculoskeletal: She exhibits tenderness.       Left foot: She exhibits decreased range of motion, tenderness and bony tenderness. She exhibits no swelling, normal capillary refill, no crepitus, no deformity and no laceration.       Feet:  Neurological: She is alert and oriented to person, place, and time.  Skin: Skin is warm and dry.  Psychiatric: She has a normal mood and affect. Judgment normal.    ED Course  Procedures (including critical care time)  Labs Reviewed - No data to display Dg Foot Complete Left  02/15/2012  *RADIOLOGY REPORT*  Clinical Data: Lateral foot pain, history of congenital clubfoot with surgery at age 46  LEFT FOOT - COMPLETE 3+ VIEW  Comparison: Left foot  films of 10/16/2004  Findings: The abnormal appearance of the midfoot most likely is congenital with no acute fracture evident.  There is degenerative change of the midfoot as well as a plantar calcaneal degenerative spur.  IMPRESSION: No acute abnormality.  Congenital  deformity of the midfoot.  Original Report Authenticated By: Juline Patch, M.D.     1. Chronic pain in left foot   2. Arthritis       MDM  Post op shoe, crutches, ice and elevation.  Aleve per package rx-hydrocodone, 20         Evalina Field, [place] 02/15/12 1510

## 2012-02-15 NOTE — ED Provider Notes (Signed)
Medical screening examination/treatment/procedure(s) were performed by non-physician practitioner and as supervising physician I was immediately available for consultation/collaboration.  Collie Wernick, MD 02/15/12 1604 

## 2012-02-17 NOTE — Progress Notes (Signed)
EGD MAY 2013 NSAID GASTRITIS

## 2012-02-25 ENCOUNTER — Ambulatory Visit (INDEPENDENT_AMBULATORY_CARE_PROVIDER_SITE_OTHER): Payer: Self-pay | Admitting: Otolaryngology

## 2012-02-25 ENCOUNTER — Encounter (HOSPITAL_COMMUNITY): Payer: Self-pay | Admitting: *Deleted

## 2012-02-25 ENCOUNTER — Emergency Department (HOSPITAL_COMMUNITY)
Admission: EM | Admit: 2012-02-25 | Discharge: 2012-02-25 | Disposition: A | Discharge status: Home / Self Care | Payer: Self-pay | Attending: Emergency Medicine | Admitting: Emergency Medicine

## 2012-02-25 DIAGNOSIS — L03211 Cellulitis of face: Secondary | ICD-10-CM | POA: Insufficient documentation

## 2012-02-25 DIAGNOSIS — L0201 Cutaneous abscess of face: Secondary | ICD-10-CM

## 2012-02-25 MED ORDER — OXYCODONE-ACETAMINOPHEN 5-325 MG PO TABS
1.0000 | ORAL_TABLET | Freq: Once | ORAL | Status: AC
Start: 2012-02-25 — End: 2012-02-25
  Administered 2012-02-25: 1 via ORAL
  Filled 2012-02-25: qty 1

## 2012-02-25 NOTE — ED Notes (Signed)
Abscess to upper lip x 3 days ,  Has been using  Heat and fat back to area.

## 2012-02-27 NOTE — ED Provider Notes (Signed)
History     CSN: [id number]  Arrival date & time 02/25/12  1340   First MD Initiated Contact with Patient 02/25/12 1411      Chief Complaint  Patient presents with  . Abscess    (Consider location/radiation/quality/duration/timing/severity/associated sxs/prior treatment) HPI Comments: Jasmine Davies presents with a 3 day history of abscess to her right upper lip.  She reports it started as a small "pimple" that she squeezed and obtained a fair amount of purulent drainage.  Since then,  It has become increasingly larger,  More tender,  Red and is now trying to develop a new drainage site in the middle of her lip.  She has been using heating pad and "fat back" to the site without relief of swelling.  She has taken ibuprofen for pain.  Patient is a 46 y.o. female presenting with abscess. The history is provided by the patient.  Abscess  Pertinent negatives include no fever.    Past Medical History  Diagnosis Date  . Hiatal hernia   . PUD (peptic ulcer disease)     ? ETOH and ASA use    Past Surgical History  Procedure Date  . Club foot release     left foot  . Cholecystectomy   . Tubal ligation   . Esophagogastroduodenoscopy 10/06/2011    ulcers,multiple in antrum and duodenitis/hiatal hernia  . Esophagogastroduodenoscopy 12/15/2011    Procedure: ESOPHAGOGASTRODUODENOSCOPY (EGD);  Surgeon: West Bali, MD;  Location: [facility];  Service: Endoscopy;  Laterality: N/A;  1:30    Family History  Problem Relation Age of Onset  . Colon cancer Neg Hx   . Hypertension Mother   . Heart failure Father   . Alcohol abuse Father     History  Substance Use Topics  . Smoking status: Current Everyday Smoker -- 0.5 packs/day    Types: Cigarettes  . Smokeless tobacco: Not on file  . Alcohol Use: No     none in 4 mos    OB History    Grav Para Term Preterm Abortions TAB SAB Ect Mult Living                  Review of Systems  Constitutional: Negative for fever and chills.    HENT: Positive for facial swelling.   Respiratory: Negative for shortness of breath and wheezing.   Skin: Positive for wound.  Neurological: Negative for numbness.    Allergies  Review of patient's allergies indicates no known allergies.  Home Medications   Current Outpatient Rx  Name Route Sig Dispense Refill  . IBUPROFEN 200 MG PO TABS Oral Take 800 mg by mouth every 6 (six) hours as needed. Pain    . NAPROXEN SODIUM 220 MG PO TABS Oral Take 220 mg by mouth 2 (two) times daily as needed. Pain    . OMEPRAZOLE 20 MG PO CPDR Oral Take 20 mg by mouth daily as needed. Take 30 minutes before breakfast and 30 minutes before dinner      BP 138/78  Pulse 79  Temp 99.2 F (37.3 C) (Oral)  Resp 17  Ht 4\' 11"  (1.499 m)  Wt 154 lb (69.854 kg)  BMI 31.10 kg/m2  SpO2 99%  Physical Exam  Constitutional: She is oriented to person, place, and time. She appears well-developed and well-nourished.  HENT:  Head: Normocephalic.  Cardiovascular: Normal rate.   Pulmonary/Chest: Effort normal.  Neurological: She is alert and oriented to person, place, and time. No sensory deficit.  Skin: Skin is warm. There is erythema.       Indurated,  Erythematous abscess right upper lip with edema from right nasal fold through entire right upper lip with nondraining pustule along lip edge near mucosal line.  Small scab mid upper lip which is also non draining.    ED Course  Procedures (including critical care time)  Labs Reviewed - No data to display No results found.   1. Facial abscess       MDM  Call placed to Dr. Suszanne Conners who is in his  office this afternoon.  He is happy to see her in his office this afternoon for treatment of her facial abscess.  Patient was advised of plan and is agreeable.  She was discharged from here and will meet Dr. Suszanne Conners in his office.       Burgess Amor, [place] 02/27/12 [phone]

## 2012-02-29 NOTE — ED Provider Notes (Signed)
Medical screening examination/treatment/procedure(s) were performed by non-physician practitioner and as supervising physician I was immediately available for consultation/collaboration.   Bronc Brosseau L Laruth Hanger, MD 02/29/12 1446 

## 2012-03-03 ENCOUNTER — Ambulatory Visit (INDEPENDENT_AMBULATORY_CARE_PROVIDER_SITE_OTHER): Payer: Self-pay | Admitting: Otolaryngology

## 2012-03-21 NOTE — Progress Notes (Signed)
Quick Note:  Noted. EGD on file. ______

## 2012-06-20 ENCOUNTER — Encounter: Payer: Self-pay | Admitting: *Deleted

## 2012-08-09 ENCOUNTER — Telehealth: Payer: Self-pay | Admitting: *Deleted

## 2012-08-09 NOTE — Telephone Encounter (Signed)
Pt is on the Feb recall for repeat CT chest due to lung nodule in Feb 2014

## 2012-08-30 ENCOUNTER — Other Ambulatory Visit: Payer: Self-pay | Admitting: Gastroenterology

## 2012-08-30 ENCOUNTER — Encounter: Payer: Self-pay | Admitting: Gastroenterology

## 2012-08-30 NOTE — Telephone Encounter (Signed)
Letter has been mailed to the patient to contact the Referral Coordinator to schedule.

## 2012-09-05 ENCOUNTER — Other Ambulatory Visit: Payer: Self-pay | Admitting: Gastroenterology

## 2012-09-05 DIAGNOSIS — R911 Solitary pulmonary nodule: Secondary | ICD-10-CM

## 2012-09-07 ENCOUNTER — Ambulatory Visit (HOSPITAL_COMMUNITY): Payer: Self-pay

## 2012-09-08 ENCOUNTER — Ambulatory Visit (HOSPITAL_COMMUNITY)
Admission: RE | Admit: 2012-09-08 | Discharge: 2012-09-08 | Disposition: A | Discharge status: Home / Self Care | Payer: Self-pay | Source: Ambulatory Visit | Attending: Gastroenterology | Admitting: Gastroenterology

## 2012-09-08 DIAGNOSIS — R911 Solitary pulmonary nodule: Secondary | ICD-10-CM | POA: Insufficient documentation

## 2012-09-08 MED ORDER — IOHEXOL 300 MG/ML  SOLN
80.0000 mL | Freq: Once | INTRAMUSCULAR | Status: AC | PRN
  Administered 2012-09-08: 80 mL via INTRAVENOUS

## 2012-09-15 ENCOUNTER — Encounter: Payer: Self-pay | Admitting: Gastroenterology

## 2012-09-19 ENCOUNTER — Telehealth: Payer: Self-pay | Admitting: *Deleted

## 2012-09-19 NOTE — Telephone Encounter (Signed)
I called Jasmine Davies to reschedule her appt for tomorrow due to a conflict in the schedule. She would like a call from you regarding her results and also her pain in her side. She will be coming on Mar 4th. Thank you.

## 2012-09-20 ENCOUNTER — Ambulatory Visit (INDEPENDENT_AMBULATORY_CARE_PROVIDER_SITE_OTHER): Payer: Self-pay | Admitting: Urgent Care

## 2012-09-20 ENCOUNTER — Encounter: Payer: Self-pay | Admitting: Urgent Care

## 2012-09-20 ENCOUNTER — Ambulatory Visit: Payer: Self-pay | Admitting: Gastroenterology

## 2012-09-20 VITALS — BP 149/85 | HR 82 | Temp 98.4°F | Ht 59.0 in | Wt 158.2 lb

## 2012-09-20 DIAGNOSIS — R109 Unspecified abdominal pain: Secondary | ICD-10-CM

## 2012-09-20 DIAGNOSIS — R112 Nausea with vomiting, unspecified: Secondary | ICD-10-CM | POA: Insufficient documentation

## 2012-09-20 DIAGNOSIS — K279 Peptic ulcer, site unspecified, unspecified as acute or chronic, without hemorrhage or perforation: Secondary | ICD-10-CM

## 2012-09-20 LAB — COMPREHENSIVE METABOLIC PANEL
Albumin: 4.2 g/dL (ref 3.5–5.2)
CO2: 26 mEq/L (ref 19–32)
Glucose, Bld: 79 mg/dL (ref 70–99)
Potassium: 4.2 mEq/L (ref 3.5–5.3)
Sodium: 140 mEq/L (ref 135–145)
Total Protein: 6.7 g/dL (ref 6.0–8.3)

## 2012-09-20 LAB — CBC WITH DIFFERENTIAL/PLATELET
Hemoglobin: 13.4 g/dL (ref 12.0–15.0)
Lymphs Abs: 4.2 10*3/uL — ABNORMAL HIGH (ref 0.7–4.0)
MCH: 32.7 pg (ref 26.0–34.0)
Monocytes Relative: 6 % (ref 3–12)
Neutro Abs: 7.1 10*3/uL (ref 1.7–7.7)
Neutrophils Relative %: 58 % (ref 43–77)
RBC: 4.1 MIL/uL (ref 3.87–5.11)

## 2012-09-20 LAB — LIPASE: Lipase: 30 U/L (ref 0–75)

## 2012-09-20 MED ORDER — HYDROCODONE-ACETAMINOPHEN 5-325 MG PO TABS
1.0000 | ORAL_TABLET | Freq: Four times a day (QID) | ORAL | Status: DC | PRN
Start: 2012-09-20 — End: 2013-01-07

## 2012-09-20 NOTE — Progress Notes (Signed)
Referring Provider: No ref. provider found Primary Care Physician:  Pcp Not In System Primary Gastroenterologist:  Dr. Jonette Eva  Chief Complaint  Patient presents with  . Follow-up    HPI:  Jasmine Davies is a 47 y.o. female here for work-in office visit for abdominal pain & bloating.  She has hx NSAID-induced gastritis & PUD.  In Feb 2013,she had multiple antral ulcers, small hiatal hernia, & duodenitis. Biopsy showed changes that can be seen with IBD but had normal CT. Felt secondary to ETOH, NSAIDS, & Goody's powders.  Pt denies any recent alcohol use.  She eats 4 small meals per day.  She has constant upper abdominal pain,nausea without vomiting,& bloating.  SHe take omeprazole 20mg  daily.  She rates the pain 6/10.  She continues to take 3 ADVIL every 4 hrs EVEN THOUGH SHE STATES SHE KNOWS SHE IS NOT SUPPOSED TO TAKE THEM.  SHe also takes ALEVE 1-2 per day.  She is taking them for abdominal pain. She is having normal BMS 3-4 per day.  Denies rectal bleeding or melena.  Weight has been stable.  Appetite is fine.     08/2012 Chest CT-Stable tiny pulmonary nodules consistent with benign disease. 09/25/11-CT A/P-No acute abdominal or pelvic findings.  Mild dilatation of the biliary system and common bile duct measures  up to 1 cm. Findings are probably related to the cholecystectomy.  Mild thickening of the gastric antrum which could be within normal  limits.  2 mm nodular density at the right lung base. If the patient is at  high risk for bronchogenic carcinoma, follow-up chest CT at 1 year  is recommended.  Past Medical History  Diagnosis Date  . Hiatal hernia   . PUD (peptic ulcer disease)     ? ETOH and ASA use    Past Surgical History  Procedure Laterality Date  . Club foot release      left foot  . Cholecystectomy    . Tubal ligation    . Esophagogastroduodenoscopy  10/06/2011    ulcers,multiple in antrum and duodenitis/hiatal hernia  . Esophagogastroduodenoscopy  12/15/2011    SLF: Moderate gastritis/ Hiatal hernia/PUD IMPROVED BUT NOT RESOLVED DUE TO ONGOING ASA USE    Current Outpatient Prescriptions  Medication Sig Dispense Refill  . ibuprofen (ADVIL,MOTRIN) 200 MG tablet Take 800 mg by mouth every 6 (six) hours as needed. Pain      . naproxen sodium (ALEVE) 220 MG tablet Take 220 mg by mouth 2 (two) times daily as needed. Pain      . omeprazole (PRILOSEC) 20 MG capsule Take 20 mg by mouth daily as needed. Take 30 minutes before breakfast and 30 minutes before dinner      . HYDROcodone-acetaminophen (NORCO/VICODIN) 5-325 MG per tablet Take 1 tablet by mouth every 6 (six) hours as needed for pain.  30 tablet  0   No current facility-administered medications for this visit.    Allergies as of 09/20/2012 - Review Complete 09/20/2012  Allergen Reaction Noted  . Penicillins Rash 09/20/2012    Review of Systems: Gen: Denies any fever, chills, sweats, anorexia, fatigue, weakness, malaise, weight loss, and sleep disorder CV: Denies chest pain, angina, palpitations, syncope, orthopnea, PND, peripheral edema, and claudication. Resp: Denies dyspnea at rest, dyspnea with exercise, cough, sputum, wheezing, coughing up blood, and pleurisy. GI: Denies vomiting blood, jaundice, and fecal incontinence.   Denies dysphagia or odynophagia Derm: Denies rash, itching, dry skin, hives, moles, warts, or unhealing ulcers.  Psych: Denies  depression, anxiety, memory loss, suicidal ideation, hallucinations, paranoia, and confusion. Heme: Denies bruising, bleeding, and enlarged lymph nodes.  Physical Exam: BP 149/85  Pulse 82  Temp(Src) 98.4 F (36.9 C) (Oral)  Ht 4\' 11"  (1.499 m)  Wt 158 lb 3.2 oz (71.759 kg)  BMI 31.94 kg/m2 General:   Alert,  Well-developed, well-nourished, pleasant and cooperative in NAD Eyes:  Sclera clear, no icterus.   Conjunctiva pink. Mouth:  No deformity or lesions, oropharynx pink and moist. Neck:  Supple; no masses or thyromegaly. Heart:  Regular  rate and rhythm; no murmurs, clicks, rubs,  or gallops. Abdomen:  Normal bowel sounds.  No bruits.  Soft, non-tender and non-distended without masses, hepatosplenomegaly or hernias noted.  No guarding or rebound tenderness.   Rectal:  Deferred.  Msk:  Symmetrical without gross deformities.  Pulses:  Normal pulses noted. Extremities:  No clubbing or edema. Neurologic:  Alert and oriented x4;  grossly normal neurologically. Skin:  Intact without significant lesions or rashes.

## 2012-09-20 NOTE — Telephone Encounter (Signed)
I called Jasmine Davies and went over her CT results. Jasmine Davies said she is still having pain in her abdomen under her breasts and swelling. Said she is having difficulty eating, and has to do very small portions of food. She was hoping to get in today for OV. I scheduled her for Lorenza Burton, NP at 3:00 today.

## 2012-09-20 NOTE — Patient Instructions (Addendum)
Please get your labs as soon as possible.  We will call you with results. Increase PRILOSEC to 20mg  before breakfast & dinner NO ADVIL, ALEVE, GOODYs, BC POWDERS,etc You may use hydrocodone as directed for pain TO ER IF SEVERE PAIN

## 2012-09-21 ENCOUNTER — Encounter: Payer: Self-pay | Admitting: Urgent Care

## 2012-09-21 ENCOUNTER — Telehealth: Payer: Self-pay | Admitting: Urgent Care

## 2012-09-21 LAB — URINALYSIS W MICROSCOPIC + REFLEX CULTURE
Hgb urine dipstick: NEGATIVE
Leukocytes, UA: NEGATIVE
Nitrite: NEGATIVE
Protein, ur: NEGATIVE mg/dL

## 2012-09-21 NOTE — Assessment & Plan Note (Signed)
Offered antiemetics,pt declined.  See abdominal pain.

## 2012-09-21 NOTE — Telephone Encounter (Signed)
Disregard, chest CT done. A user error has taken place: charting done on wrong patient and has been corrected.

## 2012-09-21 NOTE — Telephone Encounter (Signed)
Please NIC CHest CT 1 year FU nodules Thanks

## 2012-09-21 NOTE — Assessment & Plan Note (Signed)
Jasmine Davies is a pleasant 47 y.o. female with acute on chronic upper abdominal pain, nausea without vomiting & abdominal bloating.  She has hx NSAID-induced gastritis & PUD, however she is non-compliant with avaoidance of NSAIDs & ASA-containing products.  Currently taking both ALEVE & multiple ADVIL daily.    Suspect recurrent PUD.  Cannot r/o pancreatitis,UTI or biliary etiology .  CBC,CMP,UA,lipase Increase PRILOSEC to 20mg  before breakfast & dinner NO ADVIL, ALEVE, GOODYs, BC POWDERS,etc. Pt teaching. You may use hydrocodone as directed for pain TO ER IF SEVERE PAIN

## 2012-09-26 ENCOUNTER — Other Ambulatory Visit: Payer: Self-pay | Admitting: Urgent Care

## 2012-09-26 DIAGNOSIS — R109 Unspecified abdominal pain: Secondary | ICD-10-CM

## 2012-09-26 DIAGNOSIS — Z8711 Personal history of peptic ulcer disease: Secondary | ICD-10-CM

## 2012-09-26 DIAGNOSIS — R112 Nausea with vomiting, unspecified: Secondary | ICD-10-CM

## 2012-09-26 NOTE — Progress Notes (Signed)
Quick Note:  Please let pt know that her labs are normal. Her WBC are a bit high & were 4 yrs ago-sometimes this is normal for some people, however she should follow up with her PCP for this. Need progress report please. Thanks Cc:Pcp Not In System  ______

## 2012-09-26 NOTE — Progress Notes (Signed)
Quick Note:  Pt returned call and was informed. ______ 

## 2012-09-26 NOTE — Progress Notes (Signed)
PCP not on file 

## 2012-09-26 NOTE — Progress Notes (Signed)
Quick Note:  Called pt and informed of results. She said that she is still having the pain under her breasts. It is constant and the Hydrocodone does not do anything for it. She said she feels bloated all of the time. Everytime she eats anything, she swells more. She is not eating any dairy now. Please advise! ______

## 2012-09-26 NOTE — Progress Notes (Signed)
Quick Note:  Called pt. LMOM for a return call. ______

## 2012-09-26 NOTE — Progress Notes (Signed)
Quick Note:  Please call pt. She needs CT A/P w/ IV/oral contrast [id number] abdominal pain, nausea, vomiting, hx PUD. Thanks  ______

## 2012-09-26 NOTE — Progress Notes (Signed)
Patient is scheduled for CT on Thurs Feb 20th at 8:45 am and I have LMOM for patient to call me back to confirm date & time

## 2012-09-26 NOTE — Progress Notes (Signed)
Quick Note:  Routing to Leigh Ann. ______ 

## 2012-09-29 ENCOUNTER — Ambulatory Visit (HOSPITAL_COMMUNITY)
Admission: RE | Admit: 2012-09-29 | Discharge: 2012-09-29 | Disposition: A | Discharge status: Home / Self Care | Payer: Self-pay | Source: Ambulatory Visit | Attending: Urgent Care | Admitting: Urgent Care

## 2012-09-29 DIAGNOSIS — R112 Nausea with vomiting, unspecified: Secondary | ICD-10-CM | POA: Insufficient documentation

## 2012-09-29 DIAGNOSIS — Z8711 Personal history of peptic ulcer disease: Secondary | ICD-10-CM | POA: Insufficient documentation

## 2012-09-29 DIAGNOSIS — R109 Unspecified abdominal pain: Secondary | ICD-10-CM | POA: Insufficient documentation

## 2012-09-29 MED ORDER — IOHEXOL 300 MG/ML  SOLN
100.0000 mL | Freq: Once | INTRAMUSCULAR | Status: AC | PRN
  Administered 2012-09-29: 100 mL via INTRAVENOUS

## 2012-09-29 NOTE — Progress Notes (Signed)
Quick Note:  Discussed w/ pt. Abdominal pain 100% resolved at this time.  N/V resovled. Pt advised to avoid NSAIDS, continue omeprazole Needs FU OV w/ SLF in 3 months ZO:XWRUE abd pain, GERD Call sooner if needed. Pt agrees Cc:Pcp Not In System  ______

## 2012-10-03 ENCOUNTER — Other Ambulatory Visit: Payer: Self-pay | Admitting: Urgent Care

## 2012-10-03 NOTE — Telephone Encounter (Signed)
LMOM  All of the recommendations. ( Had already told pt to ED if pain worsens or urinary symptoms worsen. ) Reminded her to take PPI bid.

## 2012-10-03 NOTE — Telephone Encounter (Signed)
Pt called and said she is still having the pain under her breasts and requests a refill on hydrocodone. She also said her urine has been green for about 4 days. No pain with urination and no urgency. Please advise!

## 2012-10-03 NOTE — Telephone Encounter (Signed)
Please call pt Pt had a recent UA that was normal. Nothing to explain severe pain on CT. We cannot prescribe narcotics at this time. If severe pain or urinary symptoms, to ER. She needs to find a PCP ASAP. Continue PPI BID.  Thanks

## 2012-10-04 ENCOUNTER — Telehealth: Payer: Self-pay | Admitting: Gastroenterology

## 2012-10-04 NOTE — Telephone Encounter (Signed)
Pt aware of no narcotics and go to the ED if pain worsens.

## 2012-10-04 NOTE — Telephone Encounter (Addendum)
PLEASE CALL PT.  We do not prescribe narcotics for chronic abdominal pain. If her pain is severe she should go to the ed. She  Should avoid ibuprofen.

## 2012-10-04 NOTE — Telephone Encounter (Signed)
LMOM for a return call.  

## 2012-10-04 NOTE — Telephone Encounter (Signed)
Called and informed pt.  

## 2012-10-04 NOTE — Telephone Encounter (Signed)
Pt had called this morning and Jasmine Davies a message. I returned call, LMOM to call.

## 2012-10-04 NOTE — Telephone Encounter (Signed)
Pt called after hours and LMOM that someone had called. Please call her at [phone]

## 2012-10-11 ENCOUNTER — Ambulatory Visit: Payer: Self-pay | Admitting: Gastroenterology

## 2012-12-14 ENCOUNTER — Ambulatory Visit (INDEPENDENT_AMBULATORY_CARE_PROVIDER_SITE_OTHER): Payer: Self-pay | Admitting: Gastroenterology

## 2012-12-14 ENCOUNTER — Encounter: Payer: Self-pay | Admitting: Gastroenterology

## 2012-12-14 VITALS — BP 122/78 | HR 71 | Temp 97.4°F | Ht 63.0 in | Wt 157.8 lb

## 2012-12-14 DIAGNOSIS — R140 Abdominal distension (gaseous): Secondary | ICD-10-CM

## 2012-12-14 DIAGNOSIS — R141 Gas pain: Secondary | ICD-10-CM

## 2012-12-14 NOTE — Assessment & Plan Note (Signed)
CONTINUE OMEPRAZOLE.  TAKE 30 MINUTES PRIOR TO YOUR MEALS TWICE DAILY. FOLLOW A LOW FAT DIET. SEE INFO BELOW.  TAKE A PROBIOTIC DAILY FOR ONE MONTH ([employer]S BRAND, PHILLIP'S COLON HEALTH, OR ALIGN). TRY IT FOR ONE MONTH AND CALL IF YOUR SYMPTOMS ARE NOT BETTER. FOLLOW UP IN 4 MOS.

## 2012-12-14 NOTE — Progress Notes (Signed)
No PCP on file 

## 2012-12-14 NOTE — Progress Notes (Signed)
Reminder in epic °

## 2012-12-14 NOTE — Patient Instructions (Addendum)
CONTINUE OMEPRAZOLE.  TAKE 30 MINUTES PRIOR TO YOUR MEALS TWICE DAILY.  FOLLOW A LOW FAT DIET. SEE INFO BELOW.   TAKE A PROBIOTIC DAILY FOR ONE MONTH ([employer], [employer], OR ALIGN). TRY IT FOR ONE MONTH AND CALL IF YOUR SYMPTOMS ARE NOT BETTER.  FOLLOW UP IN 4 MOS.    Low-Fat Diet BREADS, CEREALS, PASTA, RICE, DRIED PEAS, AND BEANS These products are high in carbohydrates and most are low in fat. Therefore, they can be increased in the diet as substitutes for fatty foods. They too, however, contain calories and should not be eaten in excess. Cereals can be eaten for snacks as well as for breakfast.   FRUITS AND VEGETABLES It is good to eat fruits and vegetables. Besides being sources of fiber, both are rich in vitamins and some minerals. They help you get the daily allowances of these nutrients. Fruits and vegetables can be used for snacks and desserts.  MEATS Limit lean meat, chicken, [place], and fish to no more than 6 ounces per day. Beef, Pork, and Lamb Use lean cuts of beef, pork, and lamb. Lean cuts include:  Extra-lean ground beef.  Arm roast.  Sirloin tip.  Center-cut ham.  Round steak.  Loin chops.  Rump roast.  Tenderloin.  Trim all fat off the outside of meats before cooking. It is not necessary to severely decrease the intake of red meat, but lean choices should be made. Lean meat is rich in protein and contains a highly absorbable form of iron. Premenopausal women, in particular, should avoid reducing lean red meat because this could increase the risk for low red blood cells (iron-deficiency anemia).  Chicken and [place] These are good sources of protein. The fat of poultry can be reduced by removing the skin and underlying fat layers before cooking. Chicken and [place] can be substituted for lean red meat in the diet. Poultry should not be fried or covered with high-fat sauces. Fish and Shellfish Fish is a good source of protein. Shellfish contain  cholesterol, but they usually are low in saturated fatty acids. The preparation of fish is important. Like chicken and [place], they should not be fried or covered with high-fat sauces. EGGS Egg whites contain no fat or cholesterol. They can be eaten often. Try 1 to 2 egg whites instead of whole eggs in recipes or use egg substitutes that do not contain yolk. MILK AND DAIRY PRODUCTS Use skim or 1% milk instead of 2% or whole milk. Decrease whole milk, natural, and processed cheeses. Use nonfat or low-fat (2%) cottage cheese or low-fat cheeses made from vegetable oils. Choose nonfat or low-fat (1 to 2%) yogurt. Experiment with evaporated skim milk in recipes that call for heavy cream. Substitute low-fat yogurt or low-fat cottage cheese for sour cream in dips and salad dressings. Have at least 2 servings of low-fat dairy products, such as 2 glasses of skim (or 1%) milk each day to help get your daily calcium intake. FATS AND OILS Reduce the total intake of fats, especially saturated fat. Butterfat, lard, and beef fats are high in saturated fat and cholesterol. These should be avoided as much as possible. Vegetable fats do not contain cholesterol, but certain vegetable fats, such as coconut oil, palm oil, and palm kernel oil are very high in saturated fats. These should be limited. These fats are often used in bakery goods, processed foods, popcorn, oils, and nondairy creamers. Vegetable shortenings and some peanut butters contain hydrogenated oils, which are also saturated  fats. Read the labels on these foods and check for saturated vegetable oils. Unsaturated vegetable oils and fats do not raise blood cholesterol. However, they should be limited because they are fats and are high in calories. Total fat should still be limited to 30% of your daily caloric intake. Desirable liquid vegetable oils are corn oil, cottonseed oil, olive oil, canola oil, safflower oil, soybean oil, and sunflower oil. Peanut oil is not  as good, but small amounts are acceptable. Buy a heart-healthy tub margarine that has no partially hydrogenated oils in the ingredients. Mayonnaise and salad dressings often are made from unsaturated fats, but they should also be limited because of their high calorie and fat content. Seeds, nuts, peanut butter, olives, and avocados are high in fat, but the fat is mainly the unsaturated type. These foods should be limited mainly to avoid excess calories and fat. OTHER EATING TIPS Snacks  Most sweets should be limited as snacks. They tend to be rich in calories and fats, and their caloric content outweighs their nutritional value. Some good choices in snacks are graham crackers, melba toast, soda crackers, bagels (no egg), English muffins, fruits, and vegetables. These snacks are preferable to snack crackers, [place] fries, TORTILLA CHIPS, and POTATO chips. Popcorn should be air-popped or cooked in small amounts of liquid vegetable oil. Desserts Eat fruit, low-fat yogurt, and fruit ices instead of pastries, cake, and cookies. Sherbet, angel food cake, gelatin dessert, frozen low-fat yogurt, or other frozen products that do not contain saturated fat (pure fruit juice bars, frozen ice pops) are also acceptable.  COOKING METHODS Choose those methods that use little or no fat. They include: Poaching.  Braising.  Steaming.  Grilling.  Baking.  Stir-frying.  Broiling.  Microwaving.  Foods can be cooked in a nonstick pan without added fat, or use a nonfat cooking spray in regular cookware. Limit fried foods and avoid frying in saturated fat. Add moisture to lean meats by using water, broth, cooking wines, and other nonfat or low-fat sauces along with the cooking methods mentioned above. Soups and stews should be chilled after cooking. The fat that forms on top after a few hours in the refrigerator should be skimmed off. When preparing meals, avoid using excess salt. Salt can contribute to raising blood  pressure in some people.  EATING AWAY FROM HOME Order entres, potatoes, and vegetables without sauces or butter. When meat exceeds the size of a deck of cards (3 to 4 ounces), the rest can be taken home for another meal. Choose vegetable or fruit salads and ask for low-calorie salad dressings to be served on the side. Use dressings sparingly. Limit high-fat toppings, such as bacon, crumbled eggs, cheese, sunflower seeds, and olives. Ask for heart-healthy tub margarine instead of butter.

## 2012-12-14 NOTE — Progress Notes (Signed)
  Subjective:    Patient ID: Jasmine Davies, female    DOB: [birth year], 47 y.o.   MRN: [id number]  PCP: NONE  HPI BLOATING AFTER BREAKFAST OR IN THE AM. NOT CONSUMING DAIRY. NO CHANGE IN BOWEL HABITS. BMs: 3-4/DAY. USU #4. HURTING IN UPPER ABD-2-3X/DAY ASSOCIATED WITH A FULL FEELING. NO ASPIRIN, BC, GOODYS, IBUPROFEN/MORTIN, OR NAPROXEN/ALEVE. RARE ETOH. NEVER TRIED A PROBIOTIC. CUT BACK ON CARBONATED BEVERAGES. DRINK LEMON WATER. NO FRUIT JUICE. PT DENIES FEVER, CHILLS, BRBPR, nausea, vomiting, melena, diarrhea, constipation, problems swallowing, heartburn or indigestion. NO KNOWN TRIGGERS. LAST ABX-6 MOS AGO. TRIED GAS-X AND AVOIDING CERTAIN FOODS. NO HELP. SX BETTER WITH PASSING GAS OR BURPING.  Past Medical History  Diagnosis Date  . Hiatal hernia   . PUD (peptic ulcer disease)     ? ETOH and ASA use   Past Surgical History  Procedure Laterality Date  . Club foot release      left foot  . Cholecystectomy    . Tubal ligation    . Esophagogastroduodenoscopy  10/06/2011    ulcers,multiple in antrum and duodenitis/hiatal hernia  . Esophagogastroduodenoscopy  12/15/2011    SLF: Moderate gastritis/ Hiatal hernia/PUD IMPROVED BUT NOT RESOLVED DUE TO ONGOING ASA USE   Allergies  Allergen Reactions  . Penicillins Rash    Current Outpatient Prescriptions  Medication Sig Dispense Refill  .      .        .        . omeprazole (PRILOSEC) 20 MG capsule Take 30 minutes before breakfast and 30 minutes before dinner           Review of Systems     Objective:   Physical Exam  Vitals reviewed. Constitutional: She is oriented to person, place, and time. She appears well-nourished. No distress.  HENT:  Head: Atraumatic.  Mouth/Throat: Oropharynx is clear and moist. No oropharyngeal exudate.  Eyes: Pupils are equal, round, and reactive to light. No scleral icterus.  Neck: Normal range of motion. Neck supple.  Cardiovascular: Normal rate, regular rhythm and normal heart sounds.    Pulmonary/Chest: Effort normal and breath sounds normal. No respiratory distress.  Abdominal: Soft. Bowel sounds are normal. She exhibits no distension. There is no tenderness.  Musculoskeletal: She exhibits no edema.  Lymphadenopathy:    She has no cervical adenopathy.  Neurological: She is alert and oriented to person, place, and time.  NO FOCAL DEFICITS   Psychiatric: She has a normal mood and affect.          Assessment & Plan:

## 2013-01-07 ENCOUNTER — Emergency Department (HOSPITAL_COMMUNITY)
Admission: EM | Admit: 2013-01-07 | Discharge: 2013-01-07 | Disposition: A | Discharge status: Home / Self Care | Payer: Self-pay | Attending: Emergency Medicine | Admitting: Emergency Medicine

## 2013-01-07 ENCOUNTER — Encounter (HOSPITAL_COMMUNITY): Payer: Self-pay | Admitting: *Deleted

## 2013-01-07 ENCOUNTER — Emergency Department (HOSPITAL_COMMUNITY): Payer: Self-pay

## 2013-01-07 DIAGNOSIS — Z8719 Personal history of other diseases of the digestive system: Secondary | ICD-10-CM | POA: Insufficient documentation

## 2013-01-07 DIAGNOSIS — F17200 Nicotine dependence, unspecified, uncomplicated: Secondary | ICD-10-CM | POA: Insufficient documentation

## 2013-01-07 DIAGNOSIS — R51 Headache: Secondary | ICD-10-CM | POA: Insufficient documentation

## 2013-01-07 DIAGNOSIS — J209 Acute bronchitis, unspecified: Secondary | ICD-10-CM | POA: Insufficient documentation

## 2013-01-07 DIAGNOSIS — R05 Cough: Secondary | ICD-10-CM | POA: Insufficient documentation

## 2013-01-07 DIAGNOSIS — M545 Low back pain, unspecified: Secondary | ICD-10-CM | POA: Insufficient documentation

## 2013-01-07 DIAGNOSIS — R059 Cough, unspecified: Secondary | ICD-10-CM | POA: Insufficient documentation

## 2013-01-07 DIAGNOSIS — Z880 Allergy status to penicillin: Secondary | ICD-10-CM | POA: Insufficient documentation

## 2013-01-07 DIAGNOSIS — Z8711 Personal history of peptic ulcer disease: Secondary | ICD-10-CM | POA: Insufficient documentation

## 2013-01-07 DIAGNOSIS — G8929 Other chronic pain: Secondary | ICD-10-CM | POA: Insufficient documentation

## 2013-01-07 MED ORDER — OXYCODONE-ACETAMINOPHEN 5-325 MG PO TABS: 1.0000 | ORAL_TABLET | ORAL | Status: DC | PRN

## 2013-01-07 MED ORDER — OXYCODONE-ACETAMINOPHEN 5-325 MG PO TABS
1.0000 | ORAL_TABLET | Freq: Once | ORAL | Status: AC
  Administered 2013-01-07: 1 via ORAL
  Filled 2013-01-07: qty 1

## 2013-01-07 MED ORDER — IBUPROFEN 800 MG PO TABS
800.0000 mg | ORAL_TABLET | Freq: Once | ORAL | Status: AC
  Administered 2013-01-07: 800 mg via ORAL
  Filled 2013-01-07: qty 1

## 2013-01-07 MED ORDER — IPRATROPIUM BROMIDE 0.02 % IN SOLN
0.5000 mg | Freq: Once | RESPIRATORY_TRACT | Status: AC
  Administered 2013-01-07: 0.5 mg via RESPIRATORY_TRACT
  Filled 2013-01-07: qty 2.5

## 2013-01-07 MED ORDER — ALBUTEROL SULFATE HFA 108 (90 BASE) MCG/ACT IN AERS
2.0000 | INHALATION_SPRAY | RESPIRATORY_TRACT | Status: DC | PRN
  Administered 2013-01-07: 2 via RESPIRATORY_TRACT
  Filled 2013-01-07: qty 6.7

## 2013-01-07 MED ORDER — CYCLOBENZAPRINE HCL 10 MG PO TABS
10.0000 mg | ORAL_TABLET | Freq: Once | ORAL | Status: AC
  Administered 2013-01-07: 10 mg via ORAL
  Filled 2013-01-07: qty 1

## 2013-01-07 MED ORDER — ALBUTEROL SULFATE (5 MG/ML) 0.5% IN NEBU
2.5000 mg | INHALATION_SOLUTION | Freq: Once | RESPIRATORY_TRACT | Status: AC
  Administered 2013-01-07: 2.5 mg via RESPIRATORY_TRACT
  Filled 2013-01-07: qty 0.5

## 2013-01-07 MED ORDER — CYCLOBENZAPRINE HCL 10 MG PO TABS: 10.0000 mg | ORAL_TABLET | Freq: Two times a day (BID) | ORAL | Status: DC | PRN

## 2013-01-07 MED ORDER — NAPROXEN 500 MG PO TABS: 500.0000 mg | ORAL_TABLET | Freq: Two times a day (BID) | ORAL | Status: DC

## 2013-01-07 NOTE — ED Provider Notes (Signed)
History  This chart was scribed for Dione Booze, MD by Bennett Scrape, ED Scribe. This patient was seen in room APA17/APA17 and the patient's care was started at 3:55 PM.  CSN: [id number]  Arrival date & time 01/07/13  1506   First MD Initiated Contact with Patient 01/07/13 1555      Chief Complaint  Patient presents with  . Back Pain     The history is provided by the patient. No language interpreter was used.    HPI Comments: Jasmine Davies is a 47 y.o. female who presents to the Emergency Department complaining of 2 days of gradual onset, gradually worsening, constant mid back pain described as sharp that radiates into the right upper thigh that started after she moved a large piece of furniture "the other day". She rates her pain a 10 out of 10 currently and states that she took 2 tylenol today with no improvement. The pain is worse with bending, laying down and prolonged periods of standing. She denies having a h/o chronic back pain. She also reports a recent cough productive of yellow phlegm, cold sweats and HA. She denies fever, nausea, emesis and SOB as associated symptoms. Pt is a current 0.5ppd smoker and a former alcohol user.  Pt denies having a PCP currently.  Past Medical History  Diagnosis Date  . Hiatal hernia   . PUD (peptic ulcer disease)     ? ETOH and ASA use    Past Surgical History  Procedure Laterality Date  . Club foot release      left foot  . Cholecystectomy    . Tubal ligation    . Esophagogastroduodenoscopy  10/06/2011    ulcers,multiple in antrum and duodenitis/hiatal hernia  . Esophagogastroduodenoscopy  12/15/2011    SLF: Moderate gastritis/ Hiatal hernia/PUD IMPROVED BUT NOT RESOLVED DUE TO ONGOING ASA USE    Family History  Problem Relation Age of Onset  . Colon cancer Neg Hx   . Hypertension Mother   . Heart failure Father   . Alcohol abuse Father     History  Substance Use Topics  . Smoking status: Current Every Day Smoker -- 0.50  packs/day    Types: Cigarettes  . Smokeless tobacco: Not on file  . Alcohol Use: No     Comment: none in 4 mos    No OB history provided.  Review of Systems  Constitutional: Negative for fever.  Respiratory: Positive for cough. Negative for shortness of breath.   Gastrointestinal: Negative for nausea and vomiting.  Musculoskeletal: Positive for back pain.  Neurological: Positive for headaches.  All other systems reviewed and are negative.    Allergies  Penicillins  Home Medications   Current Outpatient Rx  Name  Route  Sig  Dispense  Refill  . acetaminophen (TYLENOL) 500 MG tablet   Oral   Take 1,000 mg by mouth every 6 (six) hours as needed for pain.           Triage Vitals: BP 103/68  Pulse 92  Temp(Src) 97.9 F (36.6 C)  Resp 24  SpO2 98%  Physical Exam  Nursing note and vitals reviewed. Constitutional: She is oriented to person, place, and time. She appears well-developed and well-nourished. No distress.  HENT:  Head: Normocephalic and atraumatic.  Eyes: Conjunctivae and EOM are normal.  Neck: Neck supple. No tracheal deviation present.  Cardiovascular: Normal rate and regular rhythm.   No murmur heard. Pulmonary/Chest: Effort normal and breath sounds normal. No respiratory distress.  Coarse breath sounds, few rales at the right base  Abdominal: Soft. There is no tenderness.  Musculoskeletal: Normal range of motion.  Mild tenderness to lower thoracic spine and entire lumbar spine, moderate bilateral paraspinal spasm, positive SLR bilaterally at 30 degrees  Neurological: She is alert and oriented to person, place, and time.  Skin: Skin is warm and dry.  Psychiatric: She has a normal mood and affect. Her behavior is normal.    ED Course  Procedures (including critical care time)  Medications  albuterol (PROVENTIL) (5 MG/ML) 0.5% nebulizer solution 2.5 mg (not administered)  ipratropium (ATROVENT) nebulizer solution 0.5 mg (not administered)   ibuprofen (ADVIL,MOTRIN) tablet 800 mg (not administered)  cyclobenzaprine (FLEXERIL) tablet 10 mg (not administered)  oxyCODONE-acetaminophen (PERCOCET/ROXICET) 5-325 MG per tablet 1 tablet (not administered)    DIAGNOSTIC STUDIES: Oxygen Saturation is 98% on room air, normal by my interpretation.    COORDINATION OF CARE: 4:02 PM-Advised pt that her pain is most likely muscularskeletal. Discussed treatment plan which includes breathing treatment, CXR, percocet, and flexeril with pt at bedside and pt agreed to plan. Will give an inhaler if CXR is normal.  Dg Chest 2 View  01/07/2013   *RADIOLOGY REPORT*  Clinical Data: Shortness breath.  Chest and back pain.  CHEST - 2 VIEW  Comparison:  09/25/2011  Findings:  The heart size and mediastinal contours are within normal limits.  Both lungs are clear.  The visualized skeletal structures are unremarkable.  IMPRESSION: No active cardiopulmonary disease.   Original Report Authenticated By: Myles Rosenthal, M.D.     1. Acute bronchitis   2. Low back pain       MDM  Back pain which seems clearly musculoskeletal. Respiratory tract infection which is most likely viral. She will get a chest x-ray to rule out pneumonia and she'll be given a therapeutic trial of albuterol with Atrovent. Intention is to send her home with an albuterol inhaler if she gets symptomatic relief. Back pain will be treated with combination of NSAIDs, muscle relaxers, and narcotic painkillers. She's given a dose of ibuprofen, cyclobenzaprine, and acetaminophen-oxycodone.  She got good relief of cough with albuterol with Atrovent, and good relief of back pain with the above-noted medications. She is discharged with an albuterol inhaler, and prescriptions for naproxen, cyclobenzaprine, and acetaminophen-oxycodone. She is advised to stop smoking.    I personally performed the services described in this documentation, which was scribed in my presence. The recorded information has  been reviewed and is accurate.    Dione Booze, MD 01/07/13 [phone]

## 2013-01-07 NOTE — ED Notes (Signed)
States that she was moving a heavy object 2 days ago and is now having mid back pain that is worse with coughing.  States that she has had a cough x1 week that is productive of yellow sputum.  States that it was hard for her to get up out of bed this morning because she was feeling so sore in her mid back.

## 2013-01-07 NOTE — ED Notes (Signed)
Pt c/o thoracic back pain that started a few days ago, admits to cough that is productive with green sputum. Denies any fever, admits to chills,

## 2013-01-12 NOTE — Progress Notes (Signed)
FEB 2014 CT A/P NAIAP, NL CMP, WBC 12.2 NL HB & UA.  REVIEWED.

## 2013-03-20 ENCOUNTER — Encounter: Payer: Self-pay | Admitting: Gastroenterology

## 2013-05-24 ENCOUNTER — Emergency Department (HOSPITAL_COMMUNITY)
Admission: EM | Admit: 2013-05-24 | Discharge: 2013-05-24 | Disposition: A | Discharge status: Home / Self Care | Payer: Self-pay | Attending: Emergency Medicine | Admitting: Emergency Medicine

## 2013-05-24 ENCOUNTER — Encounter (HOSPITAL_COMMUNITY): Payer: Self-pay | Admitting: Emergency Medicine

## 2013-05-24 DIAGNOSIS — M5430 Sciatica, unspecified side: Secondary | ICD-10-CM | POA: Insufficient documentation

## 2013-05-24 DIAGNOSIS — Z880 Allergy status to penicillin: Secondary | ICD-10-CM | POA: Insufficient documentation

## 2013-05-24 DIAGNOSIS — G8929 Other chronic pain: Secondary | ICD-10-CM | POA: Insufficient documentation

## 2013-05-24 DIAGNOSIS — Z791 Long term (current) use of non-steroidal anti-inflammatories (NSAID): Secondary | ICD-10-CM | POA: Insufficient documentation

## 2013-05-24 DIAGNOSIS — Z8711 Personal history of peptic ulcer disease: Secondary | ICD-10-CM | POA: Insufficient documentation

## 2013-05-24 DIAGNOSIS — F17200 Nicotine dependence, unspecified, uncomplicated: Secondary | ICD-10-CM | POA: Insufficient documentation

## 2013-05-24 DIAGNOSIS — M5431 Sciatica, right side: Secondary | ICD-10-CM

## 2013-05-24 MED ORDER — DEXAMETHASONE SODIUM PHOSPHATE 10 MG/ML IJ SOLN
10.0000 mg | Freq: Once | INTRAMUSCULAR | Status: AC
  Administered 2013-05-24: 10 mg via INTRAMUSCULAR
  Filled 2013-05-24: qty 1

## 2013-05-24 MED ORDER — KETOROLAC TROMETHAMINE 60 MG/2ML IM SOLN
60.0000 mg | Freq: Once | INTRAMUSCULAR | Status: AC
  Administered 2013-05-24: 60 mg via INTRAMUSCULAR
  Filled 2013-05-24: qty 2

## 2013-05-24 MED ORDER — NAPROXEN 500 MG PO TABS: 500.0000 mg | ORAL_TABLET | Freq: Two times a day (BID) | ORAL | Status: DC

## 2013-05-24 MED ORDER — HYDROCODONE-ACETAMINOPHEN 5-325 MG PO TABS: 2.0000 | ORAL_TABLET | ORAL | Status: DC | PRN

## 2013-05-24 NOTE — ED Notes (Signed)
MD at bedside. 

## 2013-05-24 NOTE — ED Provider Notes (Signed)
CSN: [id number]     Arrival date & time 05/24/13  1504 History  This chart was scribed for Jasmine Roller, MD by Quintella Reichert, ED scribe.  This patient was seen in room APFT22/APFT22 and the patient's care was started at 3:50 PM.   Chief Complaint  Patient presents with  . Back Pain    The history is provided by the patient. No language interpreter was used.    HPI Comments: Jasmine Davies is a 47 y.o. female who presents to the Emergency Department complaining of 2 days acute exacerbation of her chronic lower-to-mid back pain radiating into her right posterior leg into the foot.  Pt states she initially developed back pain 3 years ago and she has been diagnosed with sciatic nerve pain.  She denies any injury that may have initially caused her pain.  Her current flare-up began 2 days ago and she denies njuries or unusual activities that may have caused this flare-up.  Pain is worsened by lying down and she states that when she tries to sleep her entire right leg becomes numb.  She is most comfortable when she lies on her left side.  She is wearing a back brace that she was given by her step-mother.  She attempted to treat pain with ibuprofen yesterday, without relief.  She denies taking any other medications.  Pt denies h/o cancer.  She denies IV drug use.  She denies fever, urinary symptoms, vomiting or diarrhea.  She was last seen for her pain 6 months ago in the ED.  She has not been seen by a specialist.   Past Medical History  Diagnosis Date  . Hiatal hernia   . PUD (peptic ulcer disease)     ? ETOH and ASA use    Past Surgical History  Procedure Laterality Date  . Club foot release      left foot  . Cholecystectomy    . Tubal ligation    . Esophagogastroduodenoscopy  10/06/2011    ulcers,multiple in antrum and duodenitis/hiatal hernia  . Esophagogastroduodenoscopy  12/15/2011    SLF: Moderate gastritis/ Hiatal hernia/PUD IMPROVED BUT NOT RESOLVED DUE TO ONGOING ASA USE     Family History  Problem Relation Age of Onset  . Colon cancer Neg Hx   . Hypertension Mother   . Heart failure Father   . Alcohol abuse Father     History  Substance Use Topics  . Smoking status: Current Every Day Smoker -- 0.50 packs/day    Types: Cigarettes  . Smokeless tobacco: Not on file  . Alcohol Use: No     Comment: none in 4 mos    OB History   Grav Para Term Preterm Abortions TAB SAB Ect Mult Living                  Review of Systems  Constitutional: Negative for fever.  Gastrointestinal: Negative for vomiting and diarrhea.  Genitourinary: Negative for dysuria, urgency, frequency, hematuria, decreased urine volume and difficulty urinating.  Musculoskeletal: Positive for back pain.     Allergies  Penicillins  Home Medications   Current Outpatient Rx  Name  Route  Sig  Dispense  Refill  . ibuprofen (ADVIL,MOTRIN) 200 MG tablet   Oral   Take 200 mg by mouth every 6 (six) hours as needed for pain.         Marland Kitchen HYDROcodone-acetaminophen (NORCO/VICODIN) 5-325 MG per tablet   Oral   Take 2 tablets by mouth every 4 (four)  hours as needed for pain.   10 tablet   0   . naproxen (NAPROSYN) 500 MG tablet   Oral   Take 1 tablet (500 mg total) by mouth 2 (two) times daily with a meal.   30 tablet   0    BP 155/80  Pulse 64  Temp(Src) 98.3 F (36.8 C) (Oral)  Resp 20  Ht 4\' 11"  (1.499 m)  Wt 154 lb (69.854 kg)  BMI 31.09 kg/m2  SpO2 98%  Physical Exam  Nursing note and vitals reviewed. Constitutional: She is oriented to person, place, and time. She appears well-developed and well-nourished. No distress.  HENT:  Head: Normocephalic and atraumatic.  Mouth/Throat: Oropharynx is clear and moist.  Eyes: Conjunctivae are normal. Pupils are equal, round, and reactive to light. No scleral icterus.  Neck: Neck supple.  Cardiovascular: Normal rate, regular rhythm, normal heart sounds and intact distal pulses.   No murmur heard. Pulmonary/Chest: Effort  normal and breath sounds normal. No stridor. No respiratory distress. She has no wheezes. She has no rales.  Abdominal: Soft. Bowel sounds are normal. She exhibits no distension. There is no tenderness.  Musculoskeletal: Normal range of motion. She exhibits tenderness.  Tenderness over the lower thoracic and upper lumbar spine and paraspinal muscles, as well as the right buttock, reproducing the pain  Neurological: She is alert and oriented to person, place, and time.  Normal strength, sensation and reflexes to bilateral lower extremities and at the knees Able to walk with an antalgic gait  Skin: Skin is warm and dry. No rash noted.  Psychiatric: She has a normal mood and affect. Her behavior is normal.    ED Course  Procedures (including critical care time)  DIAGNOSTIC STUDIES: Oxygen Saturation is 98% on room air, normal by my interpretation.    COORDINATION OF CARE: 3:56 PM: Discussed treatment plan which includes pain medication and referral for f/u with spine specialists if symptoms do not improve.  Pt expressed understanding and agreed to plan.   Labs Review Labs Reviewed - No data to display  Imaging Review No results found.  EKG Interpretation   None       MDM   1. Sciatica, right    The patient has no focal findings on exam, she has normal sensation strength and reflexes, she is able to walk with minimal difficulty. She will be given pain medications parenteral and discharged with oral medications. She has been given followup with spinal doctor if her symptoms persist. I do not suspect a significant spinal cord problem at this time.  Meds given in ED:  Medications  dexamethasone (DECADRON) injection 10 mg (10 mg Intramuscular Given 05/24/13 1607)  ketorolac (TORADOL) injection 60 mg (60 mg Intramuscular Given 05/24/13 1606)    Discharge Medication List as of 05/24/2013  3:58 PM    START taking these medications   Details  HYDROcodone-acetaminophen  (NORCO/VICODIN) 5-325 MG per tablet Take 2 tablets by mouth every 4 (four) hours as needed for pain., Starting 05/24/2013, Until Discontinued, Print    naproxen (NAPROSYN) 500 MG tablet Take 1 tablet (500 mg total) by mouth 2 (two) times daily with a meal., Starting 05/24/2013, Until Discontinued, Print          I personally performed the services described in this documentation, which was scribed in my presence. The recorded information has been reviewed and is accurate.       Jasmine Roller, MD 05/24/13 2214

## 2013-05-24 NOTE — ED Notes (Signed)
Pt c/o pain in mid and lower back radiating down r leg.  Reports history of sciatica.

## 2014-02-25 ENCOUNTER — Emergency Department (HOSPITAL_COMMUNITY)
Admission: EM | Admit: 2014-02-25 | Discharge: 2014-02-25 | Disposition: A | Discharge status: Home / Self Care | Payer: Self-pay | Attending: Emergency Medicine | Admitting: Emergency Medicine

## 2014-02-25 ENCOUNTER — Encounter (HOSPITAL_COMMUNITY): Payer: Self-pay | Admitting: Emergency Medicine

## 2014-02-25 DIAGNOSIS — M5431 Sciatica, right side: Secondary | ICD-10-CM

## 2014-02-25 DIAGNOSIS — Z79899 Other long term (current) drug therapy: Secondary | ICD-10-CM | POA: Insufficient documentation

## 2014-02-25 DIAGNOSIS — M5430 Sciatica, unspecified side: Secondary | ICD-10-CM | POA: Insufficient documentation

## 2014-02-25 DIAGNOSIS — Z880 Allergy status to penicillin: Secondary | ICD-10-CM | POA: Insufficient documentation

## 2014-02-25 DIAGNOSIS — F17200 Nicotine dependence, unspecified, uncomplicated: Secondary | ICD-10-CM | POA: Insufficient documentation

## 2014-02-25 DIAGNOSIS — Z8719 Personal history of other diseases of the digestive system: Secondary | ICD-10-CM | POA: Insufficient documentation

## 2014-02-25 DIAGNOSIS — IMO0002 Reserved for concepts with insufficient information to code with codable children: Secondary | ICD-10-CM | POA: Insufficient documentation

## 2014-02-25 MED ORDER — PREDNISONE 10 MG PO TABS: ORAL_TABLET | ORAL | Status: DC

## 2014-02-25 MED ORDER — HYDROCODONE-ACETAMINOPHEN 5-325 MG PO TABS: 1.0000 | ORAL_TABLET | ORAL | Status: DC | PRN

## 2014-02-25 MED ORDER — HYDROCODONE-ACETAMINOPHEN 5-325 MG PO TABS
1.0000 | ORAL_TABLET | Freq: Once | ORAL | Status: AC
  Administered 2014-02-25: 1 via ORAL
  Filled 2014-02-25: qty 1

## 2014-02-25 MED ORDER — PREDNISONE 50 MG PO TABS
60.0000 mg | ORAL_TABLET | Freq: Once | ORAL | Status: AC
  Administered 2014-02-25: 60 mg via ORAL
  Filled 2014-02-25 (×2): qty 1

## 2014-02-25 NOTE — ED Provider Notes (Signed)
Medical screening examination/treatment/procedure(s) were performed by non-physician practitioner and as supervising physician I was immediately available for consultation/collaboration.  Flint MelterElliott L Zabella Wease, MD 02/25/14 [phone]

## 2014-02-25 NOTE — ED Notes (Signed)
Complain of low back pain. No known injury

## 2014-02-25 NOTE — ED Provider Notes (Signed)
CSN: [id number]     Arrival date & time 02/25/14  1653 History   This chart was scribed for non-physician practitioner Burgess AmorJulie Cara Aguino, PA-C, working with Flint MelterElliott L Wentz, MD, by Yevette EdwardsAngela Bracken, ED Scribe. This patient was seen in room APFT22/APFT22 and the patient's care was started at 5:39 PM. First MD Initiated Contact with Patient 02/25/14 1718     Chief Complaint  Patient presents with  . Back Pain    The history is provided by the patient. No language interpreter was used.   HPI Comments: Jasmine Davies is a 48 y.o. female who presents to the Emergency Department complaining of worsening lower back pain which began last week. The pt states the pain intermittently radiates down her right leg, and she also endorses intermittent numbness to her right leg but denies weakness. She has treated the pain with two IBU and heat without relief. The pt denies fever, urinary/bowel incontinence or retention, cough, or abdominal pain. The pt voices a h/o intermittent back pain similar to the current symptoms.  She denies recent falls or injuries.  She denies a h/o back surgeries. Her LNMP was six years ago.   Past Medical History  Diagnosis Date  . Hiatal hernia   . PUD (peptic ulcer disease)     ? ETOH and ASA use   Past Surgical History  Procedure Laterality Date  . Club foot release      left foot  . Cholecystectomy    . Tubal ligation    . Esophagogastroduodenoscopy  10/06/2011    ulcers,multiple in antrum and duodenitis/hiatal hernia  . Esophagogastroduodenoscopy  12/15/2011    SLF: Moderate gastritis/ Hiatal hernia/PUD IMPROVED BUT NOT RESOLVED DUE TO ONGOING ASA USE   Family History  Problem Relation Age of Onset  . Colon cancer Neg Hx   . Hypertension Mother   . Heart failure Father   . Alcohol abuse Father    History  Substance Use Topics  . Smoking status: Current Every Day Smoker -- 0.50 packs/day    Types: Cigarettes  . Smokeless tobacco: Not on file  . Alcohol Use: No      Comment: none in 4 mos   No OB history provided.  Review of Systems  Constitutional: Negative for fever.  Respiratory: Negative for cough and shortness of breath.   Cardiovascular: Negative for chest pain and leg swelling.  Gastrointestinal: Negative for abdominal pain, constipation and abdominal distention.  Genitourinary: Negative for dysuria, urgency, frequency, flank pain and difficulty urinating.  Musculoskeletal: Positive for back pain. Negative for gait problem and joint swelling.  Skin: Negative for rash.  Neurological: Positive for numbness. Negative for weakness.    Allergies  Penicillins  Home Medications   Prior to Admission medications   Medication Sig Start Date End Date Taking? Authorizing Provider  HYDROcodone-acetaminophen (NORCO/VICODIN) 5-325 MG per tablet Take 2 tablets by mouth every 4 (four) hours as needed for pain. 05/24/13   Vida RollerBrian D Miller, MD  HYDROcodone-acetaminophen (NORCO/VICODIN) 5-325 MG per tablet Take 1 tablet by mouth every 4 (four) hours as needed. 02/25/14   Burgess AmorJulie Oliviagrace Crisanti, PA-C  ibuprofen (ADVIL,MOTRIN) 200 MG tablet Take 200 mg by mouth every 6 (six) hours as needed for pain.    Historical Provider, MD  naproxen (NAPROSYN) 500 MG tablet Take 1 tablet (500 mg total) by mouth 2 (two) times daily with a meal. 05/24/13   Vida RollerBrian D Miller, MD  predniSONE (DELTASONE) 10 MG tablet 6, 5, 4, 3, 2 then 1 tablet  by mouth daily for 6 days total. 02/25/14   Burgess Amor, PA-C   Triage Vitals: BP 132/80  Pulse 101  Temp(Src) 99.3 F (37.4 C) (Oral)  Resp 18  Ht 4\' 11"  (1.499 m)  Wt 153 lb (69.4 kg)  BMI 30.89 kg/m2  SpO2 100%  Physical Exam  Nursing note and vitals reviewed. Constitutional: She appears well-developed and well-nourished.  HENT:  Head: Normocephalic.  Eyes: Conjunctivae are normal.  Neck: Normal range of motion. Neck supple.  Cardiovascular: Normal rate and intact distal pulses.   Pedal pulses normal.  Pulmonary/Chest: Effort normal.   Abdominal: Soft. Bowel sounds are normal. She exhibits no distension and no mass.  Musculoskeletal: Normal range of motion. She exhibits tenderness. She exhibits no edema.       Lumbar back: She exhibits tenderness. She exhibits no swelling, no edema and no spasm.  Right paralumbar tenderness to palpation. No midline tenderness.   Neurological: She is alert. She has normal strength. She displays no atrophy and no tremor. No sensory deficit. Gait normal.  Reflex Scores:      Patellar reflexes are 1+ on the right side and 1+ on the left side.      Achilles reflexes are 2+ on the right side and 2+ on the left side. No strength deficit noted in hip and knee flexor and extensor muscle groups.  Ankle flexion and extension intact.  Skin: Skin is warm and dry.  Psychiatric: She has a normal mood and affect.    ED Course  Procedures (including critical care time)  DIAGNOSTIC STUDIES: Oxygen Saturation is 100% on room air, normal by my interpretation.    COORDINATION OF CARE:  5:46 PM- Discussed treatment plan with patient, and the patient agreed to the plan.   Labs Review Labs Reviewed - No data to display  Imaging Review No results found.   EKG Interpretation None      MDM   Final diagnoses:  Sciatica of right side    No neuro deficit on exam or by history to suggest emergent or surgical presentation.  Also discussed worsened sx that should prompt immediate re-evaluation including distal weakness, bowel/bladder retention/incontinence.  Pt prescribed hydrocodone and prednisone taper.  Advised heat tx,  Recheck if not improving over the next 5 days.  Referrals given for establishing pcp.      I personally performed the services described in this documentation, which was scribed in my presence. The recorded information has been reviewed and is accurate.    Burgess Amor, PA-C 02/25/14 1934

## 2014-02-25 NOTE — Discharge Instructions (Signed)
Sciatica °Sciatica is pain, weakness, numbness, or tingling along the path of the sciatic nerve. The nerve starts in the lower back and runs down the back of each leg. The nerve controls the muscles in the lower leg and in the back of the knee, while also providing sensation to the back of the thigh, lower leg, and the sole of your foot. Sciatica is a symptom of another medical condition. For instance, nerve damage or certain conditions, such as a herniated disk or bone spur on the spine, pinch or put pressure on the sciatic nerve. This causes the pain, weakness, or other sensations normally associated with sciatica. Generally, sciatica only affects one side of the body. °CAUSES  °· Herniated or slipped disc. °· Degenerative disk disease. °· A pain disorder involving the narrow muscle in the buttocks (piriformis syndrome). °· Pelvic injury or fracture. °· Pregnancy. °· Tumor (rare). °SYMPTOMS  °Symptoms can vary from mild to very severe. The symptoms usually travel from the low back to the buttocks and down the back of the leg. Symptoms can include: °· Mild tingling or dull aches in the lower back, leg, or hip. °· Numbness in the back of the calf or sole of the foot. °· Burning sensations in the lower back, leg, or hip. °· Sharp pains in the lower back, leg, or hip. °· Leg weakness. °· Severe back pain inhibiting movement. °These symptoms may get worse with coughing, sneezing, laughing, or prolonged sitting or standing. Also, being overweight may worsen symptoms. °DIAGNOSIS  °Your caregiver will perform a physical exam to look for common symptoms of sciatica. He or she may ask you to do certain movements or activities that would trigger sciatic nerve pain. Other tests may be performed to find the cause of the sciatica. These may include: °· Blood tests. °· X-rays. °· Imaging tests, such as an MRI or CT scan. °TREATMENT  °Treatment is directed at the cause of the sciatic pain. Sometimes, treatment is not necessary  and the pain and discomfort goes away on its own. If treatment is needed, your caregiver may suggest: °· Over-the-counter medicines to relieve pain. °· Prescription medicines, such as anti-inflammatory medicine, muscle relaxants, or narcotics. °· Applying heat or ice to the painful area. °· Steroid injections to lessen pain, irritation, and inflammation around the nerve. °· Reducing activity during periods of pain. °· Exercising and stretching to strengthen your abdomen and improve flexibility of your spine. Your caregiver may suggest losing weight if the extra weight makes the back pain worse. °· Physical therapy. °· Surgery to eliminate what is pressing or pinching the nerve, such as a bone spur or part of a herniated disk. °HOME CARE INSTRUCTIONS  °· Only take over-the-counter or prescription medicines for pain or discomfort as directed by your caregiver. °· Apply ice to the affected area for 20 minutes, 3-4 times a day for the first 48-72 hours. Then try heat in the same way. °· Exercise, stretch, or perform your usual activities if these do not aggravate your pain. °· Attend physical therapy sessions as directed by your caregiver. °· Keep all follow-up appointments as directed by your caregiver. °· Do not wear high heels or shoes that do not provide proper support. °· Check your mattress to see if it is too soft. A firm mattress may lessen your pain and discomfort. °SEEK IMMEDIATE MEDICAL CARE IF:  °· You lose control of your bowel or bladder (incontinence). °· You have increasing weakness in the lower back, pelvis, buttocks,   or legs.  You have redness or swelling of your back.  You have a burning sensation when you urinate.  You have pain that gets worse when you lie down or awakens you at night.  Your pain is worse than you have experienced in the past.  Your pain is lasting longer than 4 weeks.  You are suddenly losing weight without reason. MAKE SURE YOU:  Understand these  instructions.  Will watch your condition.  Will get help right away if you are not doing well or get worse. Document Released: 07/21/2001 Document Revised: 01/26/2012 Document Reviewed: 12/06/2011 [facility] Patient Information 2015 [place], [place]. This information is not intended to replace advice given to you by your health care provider. Make sure you discuss any questions you have with your health care provider.    Emergency Department Resource Guide 1) Find a Doctor and Pay Out of Pocket Although you won't have to find out who is covered by your insurance plan, it is a good idea to ask around and get recommendations. You will then need to call the office and see if the doctor you have chosen will accept you as a new patient and what types of options they offer for patients who are self-pay. Some doctors offer discounts or will set up payment plans for their patients who do not have insurance, but you will need to ask so you aren't surprised when you get to your appointment.  2) Contact Your Local Health Department Not all health departments have doctors that can see patients for sick visits, but many do, so it is worth a call to see if yours does. If you don't know where your local health department is, you can check in your phone book. The CDC also has a tool to help you locate your state's health department, and many state websites also have listings of all of their local health departments.  3) Find a Walk-in Clinic If your illness is not likely to be very severe or complicated, you may want to try a walk in clinic. These are popping up all over the country in pharmacies, drugstores, and shopping centers. They're usually staffed by nurse practitioners or physician assistants that have been trained to treat common illnesses and complaints. They're usually fairly quick and inexpensive. However, if you have serious medical issues or chronic medical problems, these are probably not your best  option.  No Primary Care Doctor: - Call Health Connect at  [phone] - they can help you locate a primary care doctor that  accepts your insurance, provides certain services, etc. - Physician Referral Service- [phone]  Chronic Pain Problems: Organization         Address  Phone   Notes  [facility]  [phone] Patients need to be referred by their primary care doctor.   Medication Assistance: Organization         Address  Phone   Notes  [facility] [address] [place], [place] [address] [phone] --Must be a resident of [facility] -- Must have NO insurance coverage whatsoever (no Medicaid/ Medicare, etc.) -- The pt. MUST have a primary care doctor that directs their care regularly and follows them in the community   [facility]ist  [phone]   [employer]  [phone]    Agencies that provide inexpensive medical care: Organization         Address  Phone   Notes  [facility]  [phone]   [facility]    [phone]) [phone]   [facility] [address] [place], [place] [address] [phone]) [phone]   [facility] [address], [place] [phone]   Planned Parenthood    [phone]   [facility]    [phone]   [facility]  [address], [place] Phone:  [phone], Fax:  [phone] Hours of Operation:  9 am - 6 pm, M-F.  Also accepts Medicaid/Medicare and self-pay.  [facility]  [address], [place] Phone: [phone]) [phone], Fax: [phone]. Hours of Operation:  8:30 am - 5:30 pm, M-F.  Also accepts Medicaid and self-pay.  [facility] [place] [address], [place] Phone: [phone]   [facility] [address], [place], [place] [phone], Ext. [phone] Mondays & Thursdays: 7-9 AM.  First 15  patients are seen on a first come, first serve basis.    Medicaid-accepting [facility] Providers:  Organization         Address  Phone   Notes  [facility] [address], [place] [phone]) [phone] Also accepts self-pay patients.  [facility] [address], [place]  [phone]) [phone]   [facility] [address], [place] [phone]) [phone]   [facility] [address] [place] [address], [place] [phone]) [phone]   Renaye RakersVeita Bland [address], [place]   [phone]) [phone] Only accepts [place] Access [place] patients after they have their name applied to their card.   Self-Pay (no insurance) in [facility]:  Organization         Address  Phone   Notes  Sickle Cell Patients, [facility] [address], [place] [phone]   [facility] [address], [place] [phone]) [phone]   [facility] [place]  [address], [place] [phone]) [phone]   [facility]/Dr. Osei-Bonsu  [address] [place] [address], [place] or [id number], [place] [phone]) [phone] Phone number for both [place] and [place] locations is the same.  [facility] [address], [place] [phone]) [phone]   [facility] [place] [address] [place] [address], [place] or [address] [phone]) [phone]) [phone]   [facility] [address], [place] [phone]) [phone], phone; [phone], fax Sees patients 1st and 3rd Saturday of every month.  Must not qualify for public or private insurance (i.e. Medicaid, Medicare, [place] Health Choice, Veterans' Benefits)  Household income should be no more than 200% of the poverty level The clinic cannot treat you if you are pregnant or think you are pregnant  Sexually transmitted diseases are not treated at the clinic.    Dental  Care: Organization         Address  Phone  Notes  [facility] [address], [place] [phone]) [phone] Accepts children up to age 46 who are enrolled in [place]icaid or [place] Health Choice; pregnant women with a Medicaid card; and children who have applied for Medicaid or  Health Choice, but were declined, whose parents can pay a reduced fee at time of service.  [facility] of [employer] [facility] [place]  [address], [place] [phone]) [phone] Accepts children up to age 46 who are enrolled in Medicaid or [place] Health Choice; pregnant women with a Medicaid card; and children who have applied for Medicaid or [place] Health Choice, but were declined, whose parents can pay a reduced fee at time of service.  [facility]  [address], [place] [phone] Patients are seen by appointment only. Walk-ins are not accepted. [facility] will see patients 46 years of age and older. Monday - Tuesday (8am-5pm) Most Wednesdays (8:30-5pm) $30 per visit, cash only  [facility]  [address], [facility] [phone] Patients are seen by appointment only. Walk-ins are not accepted. [facility] will see patients 46 years of age and older. One Wednesday Evening (Monthly: Volunteer Based).  $30 per visit, cash only  [employer] [facility]  [phone] for adults; Children under age 664, call Graduate Pediatric Dentistry at [phone]. Children aged 404-14, please call [phone] to request a pediatric application.  Dental services are provided in all areas of dental care including fillings, crowns and bridges, complete and partial dentures, implants, gum treatment, root canals, and extractions. Preventive care is also provided. Treatment is provided to both adults and children. Patients are selected via a lottery and there is often a waiting list.   [facility] [address], [place]  [phone] [email or web address]   [facility] [address], [place], [place] [phone], Ext. [phone] Second and Fourth Thursday of each month, opens at 6:30 AM; Clinic ends at 9 AM.  Patients are seen on a first-come first-served basis, and a limited number are seen during each clinic.   [facility]  [address], [place], [place] [phone]) [phone]   Eligibility Requirements You must have lived in [place], [place], or [place] counties for at least the last three months.   You cannot be eligible for state or federal sponsored National Cityhealthcare insurance, including CIGNAVeterans Administration, [place]icaid, or [employer]Medicare.   You generally cannot be eligible for healthcare insurance through your employer.    How to apply: Eligibility screenings are held every Tuesday and Wednesday afternoon from 1:00 pm until 4:00 pm. You do not need an appointment for the interview!  [facility] [address], [place], [place] [phone]   [facility]  [phone]   [facility]  [phone]   [facility]  [phone]    Behavioral Health Resources in the Community: Intensive Outpatient Programs Organization         Address  Phone  Notes  [facility] [address], [place], [place] [phone]   [facility] [address], [place], [place] [phone]   [facility] [address], [place], [place]  [phone]   [facility] [address],  [place], [place] [phone] or [phone]   Substance Abuse Resources Organization         Address  Phone  Notes  [facility]  [phone]   [facility]  [phone]   [facility]  [phone]   [facility]  [phone]   Residential & Outpatient Substance Abuse Program  [phone]    Psychological Services Organization         Address  Phone  Notes  [facility]  [phone]- [phone]  [employer] Services  [phone]   [facility] [address], [place] [phone] or [phone]    Mobile Crisis Teams Organization         Address  Phone  Notes  [facility], Mobile Crisis Care Unit  [phone]   [facility]  [address] [place], [place] [phone]   Doristine Locks [address] [place] [phone]    Self-Help/Support Groups Organization         Address  Phone             Notes  [facility] of [place] - variety of support groups  [phone]- [phone] Call for more information  Narcotics Anonymous (NA), Caring Services [address], [employer] [place]  2 meetings at this location   Statistician         Address  Phone  Notes  [facility] [address],    [place]  [phone]   [facility]  [address] [place] [address]202, [place], [place] [phone]   [facility] [address] [place], [place] [phone] Admissions: 8am-3pm M-F  [facility] [address],    [place], [place] [phone]   [facility] [address] [place], [place], [place] [phone]   [facility] [address]  [place], [place] [phone]   [facility] - Intensive Outpatient [address], [place], [place] [phone]   [facility] [address] [place]  [place], [place] [phone] or [phone]   [facility] (RTS) [address] [place], [place] [phone] Accepts Medicaid  [facility] [place] [address],  [place] [phone] Substance Abuse/Addiction Treatment   [facility] Organization         Address  Phone  Notes  [facility]  [phone]   Angie Fava, PhD [address] [place], [place]   [phone] or [phone]   [facility] Behavioral   [address] [place], [place] [phone]   [facility] [address] [place], [place] [phone] Insurance/Medicaid/sponsorship through [facility] and Families [address]                                    [place], [place] [phone] Therapy/tele-psych/case  [facility] [address], [place] [phone]    Dr. Lolly Mustache  [phone]   [facility] [place]  [employer] [facility] Dept. 1) [address], [place] 2) [address], [place] 3)  [address], [place] [phone]  [phone]   [facility] Child Abuse Hotline [phone] or [phone] (After Hours)          Take your next dose of prednisone tomorrow evening.   Do not drive within 4 hours of taking hydrocodone as this will make you drowsy.  Avoid lifting,  Bending,  Twisting or any other activity that worsens your pain over the next week.  Apply an  icepack  to your lower back for 10-15 minutes every 2 hours for the next 2 days.  You should get rechecked if your symptoms are not better over the next 5 days,  Or you develop increased pain,  Weakness  in your leg(s) or loss of bladder or bowel function - these are symptoms of a worse injury.

## 2015-03-04 ENCOUNTER — Emergency Department (HOSPITAL_COMMUNITY): Payer: Self-pay

## 2015-03-04 ENCOUNTER — Encounter (HOSPITAL_COMMUNITY): Payer: Self-pay | Admitting: *Deleted

## 2015-03-04 ENCOUNTER — Emergency Department (HOSPITAL_COMMUNITY)
Admission: EM | Admit: 2015-03-04 | Discharge: 2015-03-04 | Discharge status: Against Medical Advice | Payer: Self-pay | Attending: Emergency Medicine | Admitting: Emergency Medicine

## 2015-03-04 DIAGNOSIS — R51 Headache: Secondary | ICD-10-CM | POA: Insufficient documentation

## 2015-03-04 DIAGNOSIS — R0981 Nasal congestion: Secondary | ICD-10-CM | POA: Insufficient documentation

## 2015-03-04 DIAGNOSIS — Z720 Tobacco use: Secondary | ICD-10-CM | POA: Insufficient documentation

## 2015-03-04 DIAGNOSIS — R6883 Chills (without fever): Secondary | ICD-10-CM | POA: Insufficient documentation

## 2015-03-04 DIAGNOSIS — R1110 Vomiting, unspecified: Secondary | ICD-10-CM | POA: Insufficient documentation

## 2015-03-04 NOTE — ED Notes (Signed)
Pt called for room. No answer 

## 2015-03-04 NOTE — ED Notes (Signed)
Pt no longer in waiting room 

## 2015-03-04 NOTE — ED Notes (Signed)
Pt with chest congestion, HA and vomiting since last Friday, +chills, ? Fever, denies diarrhea, denies recent sick contacts

## 2015-12-23 ENCOUNTER — Ambulatory Visit: Payer: Self-pay | Admitting: Physician Assistant

## 2015-12-23 ENCOUNTER — Encounter: Payer: Self-pay | Admitting: Physician Assistant

## 2015-12-23 VITALS — BP 172/84 | HR 84 | Temp 97.7°F | Ht 58.25 in | Wt 135.1 lb

## 2015-12-23 DIAGNOSIS — F17210 Nicotine dependence, cigarettes, uncomplicated: Secondary | ICD-10-CM | POA: Insufficient documentation

## 2015-12-23 DIAGNOSIS — F1011 Alcohol abuse, in remission: Secondary | ICD-10-CM | POA: Insufficient documentation

## 2015-12-23 DIAGNOSIS — Z13220 Encounter for screening for lipoid disorders: Secondary | ICD-10-CM

## 2015-12-23 DIAGNOSIS — Z1239 Encounter for other screening for malignant neoplasm of breast: Secondary | ICD-10-CM

## 2015-12-23 DIAGNOSIS — Z131 Encounter for screening for diabetes mellitus: Secondary | ICD-10-CM

## 2015-12-23 DIAGNOSIS — M79672 Pain in left foot: Secondary | ICD-10-CM | POA: Insufficient documentation

## 2015-12-23 DIAGNOSIS — I10 Essential (primary) hypertension: Secondary | ICD-10-CM | POA: Insufficient documentation

## 2015-12-23 LAB — GLUCOSE, POCT (MANUAL RESULT ENTRY): POC Glucose: 184 mg/dl — AB (ref 70–99)

## 2015-12-23 NOTE — Patient Instructions (Signed)

## 2015-12-23 NOTE — Progress Notes (Signed)
BP 172/84 mmHg  Pulse 84  Temp(Src) 97.7 F (36.5 C)  Ht 4' 10.25" (1.48 m)  Wt 135 lb 1.6 oz (61.281 kg)  BMI 27.98 kg/m2  SpO2 99%   Subjective:    Patient ID: Jasmine Davies, female    DOB: April 15, 1966, 50 y.o.   MRN: [id number]  HPI: Jasmine Davies is a 50 y.o. female presenting on 12/23/2015 for New Patient (Initial Visit)   HPI  Chief Complaint  Patient presents with  . New Patient (Initial Visit)    pt has not seen PCP in over 15 years. pt wants care for her L foot   Pt taking flexeril she got in the ER in [place], [place].  She had also been given diclofenac but it ran out.  She says she drinks a tall boy every other day but has history of much heavier etoh  States had last mammogram about 9 years ago at [facility]  Pt says she came in b/c she wants something done about her foot.  Had xrays in 2013 and 2006 in chart  Relevant past medical, surgical, family and social history reviewed and updated as indicated. Interim medical history since our last visit reviewed. Allergies and medications reviewed and updated.   Current outpatient prescriptions:  .  cyclobenzaprine (FLEXERIL) 10 MG tablet, Take 10 mg by mouth every 8 (eight) hours as needed for muscle spasms., Disp: , Rfl:    Review of Systems  Constitutional: Negative for fever, chills, diaphoresis, appetite change, fatigue and unexpected weight change.  HENT: Positive for dental problem. Negative for congestion, drooling, ear pain, facial swelling, hearing loss, mouth sores, sneezing, sore throat, trouble swallowing and voice change.   Eyes: Negative for pain, discharge, redness, itching and visual disturbance.  Respiratory: Positive for cough. Negative for choking, shortness of breath and wheezing.   Cardiovascular: Negative for chest pain, palpitations and leg swelling.  Gastrointestinal: Negative for vomiting, abdominal pain, diarrhea, constipation and blood in stool.  Endocrine: Negative for cold intolerance, heat  intolerance and polydipsia.  Genitourinary: Negative for dysuria, hematuria and decreased urine volume.  Musculoskeletal: Positive for back pain and arthralgias. Negative for gait problem.  Skin: Negative for rash.  Allergic/Immunologic: Negative for environmental allergies.  Neurological: Negative for seizures, syncope, light-headedness and headaches.  Hematological: Negative for adenopathy.  Psychiatric/Behavioral: Negative for suicidal ideas, dysphoric mood and agitation. The patient is not nervous/anxious.     Per HPI unless specifically indicated above     Objective:    BP 172/84 mmHg  Pulse 84  Temp(Src) 97.7 F (36.5 C)  Ht 4' 10.25" (1.48 m)  Wt 135 lb 1.6 oz (61.281 kg)  BMI 27.98 kg/m2  SpO2 99%  Wt Readings from Last 3 Encounters:  12/23/15 135 lb 1.6 oz (61.281 kg)  03/04/15 146 lb (66.225 kg)  02/25/14 153 lb (69.4 kg)    Physical Exam  Constitutional: She is oriented to person, place, and time. She appears well-developed and well-nourished.  HENT:  Head: Normocephalic and atraumatic.  Mouth/Throat: Oropharynx is clear and moist. No oropharyngeal exudate.  Eyes: Conjunctivae and EOM are normal. Pupils are equal, round, and reactive to light.  Neck: Neck supple. No thyromegaly present.  Cardiovascular: Normal rate and regular rhythm.   Pulses:      Dorsalis pedis pulses are 2+ on the right side, and 2+ on the left side.  Pulmonary/Chest: Effort normal and breath sounds normal.  Abdominal: Soft. Bowel sounds are normal. She exhibits no mass. There is  no hepatosplenomegaly. There is no tenderness.  Musculoskeletal: She exhibits no edema.       Left foot: There is tenderness and deformity. There is normal capillary refill.  Left foot with bony deformity. No skin ulcerations seen  Lymphadenopathy:    She has no cervical adenopathy.  Neurological: She is alert and oriented to person, place, and time. Gait normal.  Skin: Skin is warm and dry.  Psychiatric: She has  a normal mood and affect. Her behavior is normal.  Vitals reviewed.   Results for orders placed or performed in visit on 12/23/15  POCT Glucose (CBG)  Result Value Ref Range   POC Glucose 184 (A) 70 - 99 mg/dl      Assessment & Plan:   Encounter Diagnoses  Name Primary?  . Left foot pain   . Essential hypertension, benign   . Cigarette nicotine dependence without complication   . History of alcohol abuse   . Screening for diabetes mellitus Yes  . Screening for breast cancer   . Screening cholesterol level     -refer for mammogram -Refer to orthopedist- ? Local versus tertiary care center- for evaluation of foot to determine if surgical correction is viable -pt was given [facility] discount application -obtain fasting Baseline labs tomorrow morning -counseled pt on Cessation of etoh and smoking -rx lisinopril for blood pressure -F/u 1 month.  RTO sooner prn

## 2015-12-24 LAB — CBC WITH DIFFERENTIAL/PLATELET
BASOS ABS: 0 {cells}/uL (ref 0–200)
Basophils Relative: 0 %
EOS ABS: 158 {cells}/uL (ref 15–500)
EOS PCT: 2 %
HCT: 43.6 % (ref 35.0–45.0)
Hemoglobin: 14.6 g/dL (ref 11.7–15.5)
LYMPHS PCT: 35 %
Lymphs Abs: 2765 cells/uL (ref 850–3900)
MCH: 32.7 pg (ref 27.0–33.0)
MCHC: 33.5 g/dL (ref 32.0–36.0)
MCV: 97.8 fL (ref 80.0–100.0)
MONOS PCT: 11 %
MPV: 9.9 fL (ref 7.5–12.5)
Monocytes Absolute: 869 cells/uL (ref 200–950)
NEUTROS PCT: 52 %
Neutro Abs: 4108 cells/uL (ref 1500–7800)
PLATELETS: 289 10*3/uL (ref 140–400)
RBC: 4.46 MIL/uL (ref 3.80–5.10)
RDW: 14.3 % (ref 11.0–15.0)
WBC: 7.9 10*3/uL (ref 3.8–10.8)

## 2015-12-24 LAB — COMPLETE METABOLIC PANEL WITH GFR
ALBUMIN: 4.2 g/dL (ref 3.6–5.1)
ALK PHOS: 107 U/L (ref 33–115)
ALT: 11 U/L (ref 6–29)
AST: 16 U/L (ref 10–35)
BUN: 13 mg/dL (ref 7–25)
CALCIUM: 9.3 mg/dL (ref 8.6–10.2)
CO2: 28 mmol/L (ref 20–31)
CREATININE: 0.88 mg/dL (ref 0.50–1.10)
Chloride: 101 mmol/L (ref 98–110)
GFR, Est Non African American: 78 mL/min (ref >=60)
Glucose, Bld: 94 mg/dL (ref 65–99)
Potassium: 4.6 mmol/L (ref 3.5–5.3)
Sodium: 139 mmol/L (ref 135–146)
Total Bilirubin: 0.7 mg/dL (ref 0.2–1.2)
Total Protein: 6.9 g/dL (ref 6.1–8.1)

## 2015-12-24 LAB — LIPID PANEL
CHOL/HDL RATIO: 3.5 ratio (ref <=5.0)
Cholesterol: 188 mg/dL (ref 125–200)
HDL: 54 mg/dL (ref >=46)
LDL Cholesterol: 111 mg/dL (ref <130)
Triglycerides: 117 mg/dL (ref <150)
VLDL: 23 mg/dL (ref <30)

## 2015-12-24 LAB — HEMOGLOBIN A1C
HEMOGLOBIN A1C: 5.5 % (ref <5.7)
MEAN PLASMA GLUCOSE: 111 mg/dL

## 2015-12-25 ENCOUNTER — Other Ambulatory Visit: Payer: Self-pay | Admitting: Physician Assistant

## 2015-12-25 MED ORDER — LISINOPRIL 10 MG PO TABS: 10.0000 mg | ORAL_TABLET | Freq: Every day | ORAL | Status: DC

## 2016-01-02 ENCOUNTER — Other Ambulatory Visit: Payer: Self-pay | Admitting: Physician Assistant

## 2016-01-02 ENCOUNTER — Encounter: Payer: Self-pay | Admitting: Student

## 2016-01-02 DIAGNOSIS — Z1231 Encounter for screening mammogram for malignant neoplasm of breast: Secondary | ICD-10-CM

## 2016-01-02 NOTE — Telephone Encounter (Signed)
Err encounter

## 2016-01-08 ENCOUNTER — Encounter (HOSPITAL_COMMUNITY): Payer: Self-pay

## 2016-01-27 ENCOUNTER — Ambulatory Visit: Payer: Self-pay | Admitting: Physician Assistant

## 2016-01-29 ENCOUNTER — Encounter (HOSPITAL_COMMUNITY): Payer: Self-pay

## 2016-02-07 ENCOUNTER — Emergency Department (HOSPITAL_COMMUNITY): Payer: Self-pay

## 2016-02-07 ENCOUNTER — Encounter (HOSPITAL_COMMUNITY): Payer: Self-pay | Admitting: Emergency Medicine

## 2016-02-07 ENCOUNTER — Emergency Department (HOSPITAL_COMMUNITY)
Admission: EM | Admit: 2016-02-07 | Discharge: 2016-02-07 | Disposition: A | Discharge status: Home / Self Care | Payer: Self-pay | Attending: Emergency Medicine | Admitting: Emergency Medicine

## 2016-02-07 DIAGNOSIS — G8929 Other chronic pain: Secondary | ICD-10-CM | POA: Insufficient documentation

## 2016-02-07 DIAGNOSIS — M549 Dorsalgia, unspecified: Secondary | ICD-10-CM

## 2016-02-07 DIAGNOSIS — F17210 Nicotine dependence, cigarettes, uncomplicated: Secondary | ICD-10-CM | POA: Insufficient documentation

## 2016-02-07 DIAGNOSIS — M545 Low back pain: Secondary | ICD-10-CM | POA: Insufficient documentation

## 2016-02-07 HISTORY — DX: Dorsalgia, unspecified: M54.9

## 2016-02-07 HISTORY — DX: Sciatica, unspecified side: M54.30

## 2016-02-07 HISTORY — DX: Pain in left foot: M79.672

## 2016-02-07 HISTORY — DX: Other chronic pain: G89.29

## 2016-02-07 MED ORDER — METHOCARBAMOL 500 MG PO TABS: 1000.0000 mg | ORAL_TABLET | Freq: Four times a day (QID) | ORAL | Status: DC | PRN

## 2016-02-07 MED ORDER — KETOROLAC TROMETHAMINE 30 MG/ML IJ SOLN
30.0000 mg | Freq: Once | INTRAMUSCULAR | Status: AC
  Administered 2016-02-07: 30 mg via INTRAVENOUS
  Filled 2016-02-07: qty 1

## 2016-02-07 MED ORDER — TRAMADOL HCL 50 MG PO TABS: 50.0000 mg | ORAL_TABLET | Freq: Four times a day (QID) | ORAL | Status: DC | PRN

## 2016-02-07 MED ORDER — METHOCARBAMOL 500 MG PO TABS
1000.0000 mg | ORAL_TABLET | Freq: Once | ORAL | Status: AC
  Administered 2016-02-07: 1000 mg via ORAL
  Filled 2016-02-07: qty 2

## 2016-02-07 NOTE — ED Provider Notes (Signed)
CSN: [id number]     Arrival date & time 02/07/16  1946 History   First MD Initiated Contact with Patient 02/07/16 1954     Chief Complaint  Patient presents with  . Back Pain      HPI Pt was seen at 2005. Per pt, c/o gradual onset and persistence of constant acute flair of her chronic low back "pain" for the past several days.  Denies any change in her usual chronic pain pattern.  Pain worsens with palpation of the area and body position changes. Denies incont/retention of bowel or bladder, no saddle anesthesia, no focal motor weakness, no tingling/numbness in extremities, no fevers, no injury, no abd pain. The symptoms have been associated with no other complaints. The patient has a significant history of similar symptoms previously. Pt was given IV morphine by EMS en route; requesting "more pain medicine" on arrival to the ED.    Past Medical History  Diagnosis Date  . PUD (peptic ulcer disease)     ? ETOH and ASA use  . Hiatal hernia   . Hernia, hiatal   . Cleft foot L foot  . Chronic back pain   . Sciatica   . Chronic pain in left foot    Past Surgical History  Procedure Laterality Date  . Club foot release      left foot  . Esophagogastroduodenoscopy  10/06/2011    ulcers,multiple in antrum and duodenitis/hiatal hernia  . Esophagogastroduodenoscopy  12/15/2011    SLF: Moderate gastritis/ Hiatal hernia/PUD IMPROVED BUT NOT RESOLVED DUE TO ONGOING ASA USE  . Cholecystectomy  1993  . Tubal ligation  1999   Family History  Problem Relation Age of Onset  . Colon cancer Neg Hx   . Hypertension Mother   . Heart failure Father     CHF  . Alcohol abuse Father   . Cancer Father    Social History  Substance Use Topics  . Smoking status: Current Every Day Smoker -- 0.50 packs/day for 35 years    Types: Cigarettes  . Smokeless tobacco: Never Used  . Alcohol Use: 40.2 oz/week    67 Cans of beer per week     Comment: 40 oz every other day    Review of Systems ROS: Statement:  All systems negative except as marked or noted in the HPI; Constitutional: Negative for fever and chills. ; ; Eyes: Negative for eye pain, redness and discharge. ; ; ENMT: Negative for ear pain, hoarseness, nasal congestion, sinus pressure and sore throat. ; ; Cardiovascular: Negative for chest pain, palpitations, diaphoresis, dyspnea and peripheral edema. ; ; Respiratory: Negative for cough, wheezing and stridor. ; ; Gastrointestinal: Negative for nausea, vomiting, diarrhea, abdominal pain, blood in stool, hematemesis, jaundice and rectal bleeding. . ; ; Genitourinary: Negative for dysuria, flank pain and hematuria. ; ; Musculoskeletal: +LBP. Negative for neck pain. Negative for swelling and trauma.; ; Skin: Negative for pruritus, rash, abrasions, blisters, bruising and skin lesion.; ; Neuro: Negative for headache, lightheadedness and neck stiffness. Negative for weakness, altered level of consciousness, altered mental status, extremity weakness, paresthesias, involuntary movement, seizure and syncope.      Allergies  Penicillins  Home Medications   Prior to Admission medications   Not on File   BP 159/90 mmHg  Pulse 77  Temp(Src) 98.4 F (36.9 C) (Oral)  Resp 20  Ht 4\' 11"  (1.499 m)  Wt 135 lb (61.236 kg)  BMI 27.25 kg/m2  SpO2 97% Physical Exam  2010: Physical examination:  Nursing notes reviewed; Vital signs and O2 SAT reviewed;  Constitutional: Well developed, Well nourished, Well hydrated, In no acute distress; Head:  Normocephalic, atraumatic; Eyes: EOMI, PERRL, No scleral icterus; ENMT: Mouth and pharynx normal, Mucous membranes moist; Neck: Supple, Full range of motion, No lymphadenopathy; Cardiovascular: Regular rate and rhythm, No murmur, rub, or gallop; Respiratory: Breath sounds clear & equal bilaterally, No rales, rhonchi, wheezes.  Speaking full sentences with ease, Normal respiratory effort/excursion; Chest: Nontender, Movement normal; Abdomen: Soft, Nontender, Nondistended,  Normal bowel sounds; Genitourinary: No CVA tenderness; Spine:  No midline CS, TS, LS tenderness. +TTP right lumbar paraspinal muscles. No rash.;; Extremities: Pulses normal, No tenderness, No edema, No calf edema or asymmetry.; Neuro: AA&Ox3, Major CN grossly intact.  Speech clear. No gross focal motor or sensory deficits in extremities. Strength 5/5 equal bilat UE's and LE's, including great toe dorsiflexion.  DTR 2/4 equal bilat UE's and LE's.  No gross sensory deficits.  Neg straight leg raises bilat.; Skin: Color normal, Warm, Dry.   ED Course  Procedures (including critical care time) Labs Review Imaging Review  I have personally reviewed and evaluated these images and lab results as part of my medical decision-making.   EKG Interpretation None      MDM  MDM Reviewed: previous chart, nursing note and vitals Interpretation: x-ray     Dg Lumbar Spine Complete 02/07/2016  CLINICAL DATA:  Chronic low back pain no injury EXAM: LUMBAR SPINE - COMPLETE 4+ VIEW COMPARISON:  None. FINDINGS: Mild to moderate levoscoliosis in the lumbar spine. Normal alignment. Negative for fracture or mass. No pars defect. Disc space is well maintained. IMPRESSION: Scoliosis.  No significant degenerative change. Electronically Signed   By: Marlan Palauharles  Clark M.D.   On: 02/07/2016 21:08    2115:  Pt texting and talking on her phone most of her ED visit. Appears NAD. XR reassuring. Tx symptomatically, f/u PMD. Dx and testing d/w pt.  Questions answered.  Verb understanding, agreeable to d/c home with outpt f/u.   Samuel JesterKathleen Kito Cuffe, DO 02/09/16 1501

## 2016-02-07 NOTE — ED Notes (Signed)
Low back pain radiating down Rt leg.  Given total of 5mg  of morphine IV by ems prior to arrival

## 2016-02-07 NOTE — Discharge Instructions (Signed)
Take the prescriptions as directed.  Apply moist heat or ice to the area(s) of discomfort, for 15 minutes at a time, several times per day for the next few days.  Do not fall asleep on a heating or ice pack.  Call your regular medical doctor on Monday to schedule a follow up appointment next week.  Return to the Emergency Department immediately if worsening. ° °

## 2016-02-13 ENCOUNTER — Encounter: Payer: Self-pay | Admitting: Physician Assistant

## 2016-04-15 ENCOUNTER — Encounter: Payer: Self-pay | Admitting: Physician Assistant

## 2016-04-30 ENCOUNTER — Encounter: Payer: Self-pay | Admitting: Physician Assistant

## 2017-03-17 ENCOUNTER — Other Ambulatory Visit: Payer: Self-pay | Admitting: Physician Assistant

## 2017-03-17 ENCOUNTER — Encounter: Payer: Self-pay | Admitting: Physician Assistant

## 2017-03-17 ENCOUNTER — Ambulatory Visit: Payer: Self-pay | Admitting: Physician Assistant

## 2017-03-17 VITALS — BP 156/80 | HR 72 | Temp 97.7°F | Ht 58.5 in | Wt 156.5 lb

## 2017-03-17 DIAGNOSIS — I10 Essential (primary) hypertension: Secondary | ICD-10-CM

## 2017-03-17 DIAGNOSIS — E669 Obesity, unspecified: Secondary | ICD-10-CM

## 2017-03-17 DIAGNOSIS — E785 Hyperlipidemia, unspecified: Secondary | ICD-10-CM

## 2017-03-17 DIAGNOSIS — Z1211 Encounter for screening for malignant neoplasm of colon: Secondary | ICD-10-CM

## 2017-03-17 DIAGNOSIS — F17210 Nicotine dependence, cigarettes, uncomplicated: Secondary | ICD-10-CM

## 2017-03-17 DIAGNOSIS — M479 Spondylosis, unspecified: Secondary | ICD-10-CM

## 2017-03-17 DIAGNOSIS — F1010 Alcohol abuse, uncomplicated: Secondary | ICD-10-CM

## 2017-03-17 MED ORDER — HYDROCHLOROTHIAZIDE 12.5 MG PO CAPS: 12.5000 mg | ORAL_CAPSULE | Freq: Every day | ORAL | 1 refills | Status: DC

## 2017-03-17 NOTE — Progress Notes (Signed)
BP (!) 156/80 (BP Location: Left Arm, Patient Position: Sitting, Cuff Size: Normal)   Pulse 72   Temp 97.7 F (36.5 C)   Ht 4' 10.5" (1.486 m)   Wt 156 lb 8 oz (71 kg)   SpO2 98%   BMI 32.15 kg/m    Subjective:    Patient ID: Jasmine Davies, female    DOB: [birth year], 51 y.o.   MRN: [id number]  HPI: Jasmine Davies is a 51 y.o. female presenting on 03/17/2017 for New Patient (Initial Visit) (returning patient. pt was being seen at [facility] pt was last seen there last Monday. )   HPI   Chief Complaint  Patient presents with  . New Patient (Initial Visit)    returning patient. pt was being seen at [facility] pt was last seen there last Monday.     Pt was seen here one time in may 2017 and she never followed up.  She has been going to the [facility] regularly since that time.  Discussed with pt that she can't go both places and she says she wants to come here.  Pt says her bp was high- in the 150's, last week when she was at the health dept.    Pt has labs with her from [facility] from december 2017 when she was seen for osteoarthritis of the spine  In reviewed history, pt states 1-40 oz beer every other day (which is equivalent of 1/2 case/beer weekly).  When asked, pt says that yes, she considers herself an alcoholic.  She says she is trying to cut back on her own  Relevant past medical, surgical, family and social history reviewed and updated as indicated. Interim medical history since our last visit reviewed. Allergies and medications reviewed and updated.   Current Outpatient Prescriptions:  .  lisinopril (PRINIVIL,ZESTRIL) 20 MG tablet, Take 20 mg by mouth daily., Disp: , Rfl:  .  lovastatin (MEVACOR) 20 MG tablet, Take 10 mg by mouth at bedtime., Disp: , Rfl:  .  meloxicam (MOBIC) 7.5 MG tablet, Take 7.5 mg by mouth 2 (two) times daily as needed for pain. , Disp: , Rfl:    Review of Systems  Constitutional: Positive for diaphoresis and unexpected weight change. Negative for  appetite change, chills, fatigue and fever.  HENT: Negative for congestion, drooling, ear pain, facial swelling, hearing loss, mouth sores, sneezing, sore throat, trouble swallowing and voice change.   Eyes: Negative for pain, discharge, redness, itching and visual disturbance.  Respiratory: Positive for cough. Negative for choking, shortness of breath and wheezing.   Cardiovascular: Positive for leg swelling. Negative for chest pain and palpitations.  Gastrointestinal: Positive for abdominal pain. Negative for blood in stool, constipation, diarrhea and vomiting.  Endocrine: Negative for cold intolerance, heat intolerance and polydipsia.  Genitourinary: Negative for decreased urine volume, dysuria and hematuria.  Musculoskeletal: Positive for arthralgias, back pain and gait problem.  Skin: Negative for rash.  Allergic/Immunologic: Negative for environmental allergies.  Neurological: Positive for headaches. Negative for seizures, syncope and light-headedness.  Hematological: Negative for adenopathy.  Psychiatric/Behavioral: Negative for agitation, dysphoric mood and suicidal ideas. The patient is not nervous/anxious.     Per HPI unless specifically indicated above     Objective:    BP (!) 156/80 (BP Location: Left Arm, Patient Position: Sitting, Cuff Size: Normal)   Pulse 72   Temp 97.7 F (36.5 C)   Ht 4' 10.5" (1.486 m)   Wt 156 lb 8 oz (71 kg)  SpO2 98%   BMI 32.15 kg/m   Wt Readings from Last 3 Encounters:  03/17/17 156 lb 8 oz (71 kg)  02/07/16 135 lb (61.2 kg)  12/23/15 135 lb 1.6 oz (61.3 kg)    Physical Exam  Constitutional: She is oriented to person, place, and time. She appears well-developed and well-nourished.  HENT:  Head: Normocephalic and atraumatic.  Neck: Neck supple.  Cardiovascular: Normal rate and regular rhythm.   Pulmonary/Chest: Effort normal and breath sounds normal.  Abdominal: Soft. Bowel sounds are normal. She exhibits no mass. There is no  hepatosplenomegaly. There is no tenderness.  Musculoskeletal: She exhibits no edema.  Lymphadenopathy:    She has no cervical adenopathy.  Neurological: She is alert and oriented to person, place, and time.  Skin: Skin is warm and dry.  Psychiatric: She has a normal mood and affect. Her behavior is normal.  Vitals reviewed.   Results for orders placed or performed in visit on 12/23/15  COMPLETE METABOLIC PANEL WITH GFR  Result Value Ref Range   Sodium 139 135 - 146 mmol/L   Potassium 4.6 3.5 - 5.3 mmol/L   Chloride 101 98 - 110 mmol/L   CO2 28 20 - 31 mmol/L   Glucose, Bld 94 65 - 99 mg/dL   BUN 13 7 - 25 mg/dL   Creat 0.88 0.50 - 1.10 mg/dL   Total Bilirubin 0.7 0.2 - 1.2 mg/dL   Alkaline Phosphatase 107 33 - 115 U/L   AST 16 10 - 35 U/L   ALT 11 6 - 29 U/L   Total Protein 6.9 6.1 - 8.1 g/dL   Albumin 4.2 3.6 - 5.1 g/dL   Calcium 9.3 8.6 - 10.2 mg/dL   GFR, Est African American >89 >=60 mL/min   GFR, Est Non African American 78 >=60 mL/min  HgB A1c  Result Value Ref Range   Hgb A1c MFr Bld 5.5 <5.7 %   Mean Plasma Glucose 111 mg/dL  CBC w/Diff/Platelet  Result Value Ref Range   WBC 7.9 3.8 - 10.8 K/uL   RBC 4.46 3.80 - 5.10 MIL/uL   Hemoglobin 14.6 11.7 - 15.5 g/dL   HCT 43.6 35.0 - 45.0 %   MCV 97.8 80.0 - 100.0 fL   MCH 32.7 27.0 - 33.0 pg   MCHC 33.5 32.0 - 36.0 g/dL   RDW 14.3 11.0 - 15.0 %   Platelets 289 140 - 400 K/uL   MPV 9.9 7.5 - 12.5 fL   Neutro Abs 4,108 1,500 - 7,800 cells/uL   Lymphs Abs 2,765 850 - 3,900 cells/uL   Monocytes Absolute 869 200 - 950 cells/uL   Eosinophils Absolute 158 15 - 500 cells/uL   Basophils Absolute 0 0 - 200 cells/uL   Neutrophils Relative % 52 %   Lymphocytes Relative 35 %   Monocytes Relative 11 %   Eosinophils Relative 2 %   Basophils Relative 0 %   Smear Review Criteria for review not met   Lipid Profile  Result Value Ref Range   Cholesterol 188 125 - 200 mg/dL   Triglycerides 117 <150 mg/dL   HDL 54 >=46 mg/dL    Total CHOL/HDL Ratio 3.5 <=5.0 Ratio   VLDL 23 <30 mg/dL   LDL Cholesterol 111 <130 mg/dL  POCT Glucose (CBG)  Result Value Ref Range   POC Glucose 184 (A) 70 - 99 mg/dl      Assessment & Plan:   Encounter Diagnoses  Name Primary?  . Essential hypertension, benign  Yes  . Hyperlipidemia, unspecified hyperlipidemia type   . Cigarette nicotine dependence without complication   . Alcohol abuse   . Osteoarthritis of spine, unspecified spinal osteoarthritis complication status, unspecified spinal region   . Obesity, unspecified classification, unspecified obesity type, unspecified whether serious comorbidity present   . Screening for colon cancer       -pt is given a list of local AA and encouraged to get some help with her drinking.  Recommended she buy smaller cans if she is wanting the 'cut back"  -gave pt contact information for [facility] for anxiety and/or etoh -pt was given iFOBT for colon cancer screening -pt signed Record release to get most recent labs, PAP,. Mammogram from [facility] -will Add hctz for bp that is still too high.  Pt is to continue her current medications -pt to follow up in 1 month. RTO sooner prn

## 2017-03-18 LAB — IFOBT (OCCULT BLOOD): IFOBT: NEGATIVE

## 2017-04-19 ENCOUNTER — Encounter: Payer: Self-pay | Admitting: Physician Assistant

## 2017-04-19 ENCOUNTER — Ambulatory Visit: Payer: Self-pay | Admitting: Physician Assistant

## 2017-04-19 VITALS — BP 144/72 | HR 83 | Temp 98.1°F | Ht 58.25 in | Wt 156.5 lb

## 2017-04-19 DIAGNOSIS — F17210 Nicotine dependence, cigarettes, uncomplicated: Secondary | ICD-10-CM

## 2017-04-19 DIAGNOSIS — E669 Obesity, unspecified: Secondary | ICD-10-CM

## 2017-04-19 DIAGNOSIS — Z1239 Encounter for other screening for malignant neoplasm of breast: Secondary | ICD-10-CM

## 2017-04-19 DIAGNOSIS — E785 Hyperlipidemia, unspecified: Secondary | ICD-10-CM

## 2017-04-19 DIAGNOSIS — I10 Essential (primary) hypertension: Secondary | ICD-10-CM

## 2017-04-19 MED ORDER — LISINOPRIL-HYDROCHLOROTHIAZIDE 20-12.5 MG PO TABS: 1.0000 | ORAL_TABLET | Freq: Every day | ORAL | 3 refills | Status: DC

## 2017-04-19 NOTE — Progress Notes (Signed)
BP (!) 144/72 (BP Location: Left Arm, Patient Position: Sitting, Cuff Size: Normal)   Pulse 83   Temp 98.1 F (36.7 C)   Ht 4' 10.25" (1.48 m)   Wt 156 lb 8 oz (71 kg)   SpO2 98%   BMI 32.43 kg/m    Subjective:    Patient ID: Jasmine SonsJanet M Feuerstein, female    DOB: Feb 23, 1966, 51 y.o.   MRN: [id number]  HPI: Jasmine Davies is a 51 y.o. female presenting on 04/19/2017 for Hypertension   HPI   [facility]  Lipids done and lovastain started 03/10/17.  [facility]  Pt ran out of her hctz several days ago.  She says she has cut back on her etoh.  She is feeling good.    Relevant past medical, surgical, family and social history reviewed and updated as indicated. Interim medical history since our last visit reviewed. Allergies and medications reviewed and updated.   Current Outpatient Prescriptions:  .  cyclobenzaprine (FLEXERIL) 10 MG tablet, Take 10 mg by mouth 3 (three) times daily as needed for muscle spasms., Disp: , Rfl:  .  hydrochlorothiazide (MICROZIDE) 12.5 MG capsule, Take 1 capsule (12.5 mg total) by mouth daily., Disp: 30 capsule, Rfl: 1 .  lisinopril (PRINIVIL,ZESTRIL) 20 MG tablet, Take 20 mg by mouth daily., Disp: , Rfl:  .  lovastatin (MEVACOR) 20 MG tablet, Take 10 mg by mouth at bedtime., Disp: , Rfl:  .  meloxicam (MOBIC) 7.5 MG tablet, Take 7.5 mg by mouth 2 (two) times daily as needed for pain. , Disp: , Rfl:    Review of Systems  Constitutional: Positive for diaphoresis. Negative for appetite change, chills, fatigue, fever and unexpected weight change.  HENT: Negative for congestion, drooling, ear pain, facial swelling, hearing loss, mouth sores, sneezing, sore throat, trouble swallowing and voice change.   Eyes: Negative for pain, discharge, redness, itching and visual disturbance.  Respiratory: Negative for cough, choking, shortness of breath and wheezing.   Cardiovascular: Negative for chest pain, palpitations and leg swelling.   Gastrointestinal: Negative for abdominal pain, blood in stool, constipation, diarrhea and vomiting.  Endocrine: Negative for cold intolerance, heat intolerance and polydipsia.  Genitourinary: Negative for decreased urine volume, dysuria and hematuria.  Musculoskeletal: Positive for arthralgias, back pain and gait problem.  Skin: Negative for rash.  Allergic/Immunologic: Negative for environmental allergies.  Neurological: Negative for seizures, syncope, light-headedness and headaches.  Hematological: Negative for adenopathy.  Psychiatric/Behavioral: Negative for agitation, dysphoric mood and suicidal ideas. The patient is not nervous/anxious.     Per HPI unless specifically indicated above     Objective:    BP (!) 144/72 (BP Location: Left Arm, Patient Position: Sitting, Cuff Size: Normal)   Pulse 83   Temp 98.1 F (36.7 C)   Ht 4' 10.25" (1.48 m)   Wt 156 lb 8 oz (71 kg)   SpO2 98%   BMI 32.43 kg/m   Wt Readings from Last 3 Encounters:  04/19/17 156 lb 8 oz (71 kg)  03/17/17 156 lb 8 oz (71 kg)  02/07/16 135 lb (61.2 kg)    Physical Exam  Constitutional: She is oriented to person, place, and time. She appears well-developed and well-nourished.  HENT:  Head: Normocephalic and atraumatic.  Neck: Neck supple.  Cardiovascular: Normal rate and regular rhythm.   Pulmonary/Chest: Effort normal and breath sounds normal.  Abdominal: Soft. Bowel sounds are normal. She exhibits no mass. There is no hepatosplenomegaly. There  is no tenderness.  Musculoskeletal: She exhibits no edema.  Lymphadenopathy:    She has no cervical adenopathy.  Neurological: She is alert and oriented to person, place, and time.  Skin: Skin is warm and dry.  Psychiatric: She has a normal mood and affect. Her behavior is normal.  Vitals reviewed.   Results for orders placed or performed in visit on 03/17/17  IFOBT POC (occult bld, rslt in office)  Result Value Ref Range   IFOBT Negative        Assessment & Plan:    Encounter Diagnoses  Name Primary?  . Essential hypertension, benign Yes  . Hyperlipidemia, unspecified hyperlipidemia type   . Cigarette nicotine dependence without complication   . Screening for breast cancer   . Obesity, unspecified classification, unspecified obesity type, unspecified whether serious comorbidity present     -pt Will need lipid testing early November -will order screening mammogram -will plan for PAP at next OV.  -pt to follow up 2 months.  RTO sooner prn

## 2017-05-05 ENCOUNTER — Encounter: Payer: Self-pay | Admitting: Physician Assistant

## 2017-05-20 ENCOUNTER — Other Ambulatory Visit: Payer: Self-pay | Admitting: Physician Assistant

## 2017-05-20 DIAGNOSIS — Z1231 Encounter for screening mammogram for malignant neoplasm of breast: Secondary | ICD-10-CM

## 2017-05-21 ENCOUNTER — Other Ambulatory Visit: Payer: Self-pay | Admitting: Physician Assistant

## 2017-05-21 DIAGNOSIS — Z1231 Encounter for screening mammogram for malignant neoplasm of breast: Secondary | ICD-10-CM

## 2017-06-03 ENCOUNTER — Encounter: Payer: Self-pay | Admitting: Physician Assistant

## 2017-06-03 ENCOUNTER — Ambulatory Visit: Payer: Self-pay | Admitting: Physician Assistant

## 2017-06-03 VITALS — BP 170/90 | HR 70 | Temp 97.3°F | Ht 58.25 in | Wt 158.0 lb

## 2017-06-03 DIAGNOSIS — F1011 Alcohol abuse, in remission: Secondary | ICD-10-CM

## 2017-06-03 NOTE — Progress Notes (Signed)
BP (!) 170/90 (BP Location: Left Arm, Patient Position: Sitting, Cuff Size: Normal)   Pulse 70   Temp (!) 97.3 F (36.3 C)   Ht 4' 10.25" (1.48 m)   Wt 158 lb (71.7 kg)   SpO2 98%   BMI 32.74 kg/m    Subjective:    Patient ID: Jasmine Davies, female    DOB: 05/02/1966, 51 y.o.   MRN: [id number]  HPI: Jasmine Davies is a 51 y.o. female presenting on 06/03/2017 for Follow-up (from [facility] ER)   HPI   Pt went to [facility] on 05/28/17 and was diagnosed with pancreatitis secondary to alcohol abuse. Records unavailable right now  She is feeling some better but gets nausea when she lays down at night.  She says she has stopped drinking.  She had been drinking heavily prior to that.   Pt hasn't taken her bp medication yet today  She says her PO intake is good.    Relevant past medical, surgical, family and social history reviewed and updated as indicated. Interim medical history since our last visit reviewed. Allergies and medications reviewed and updated.   Current Outpatient Prescriptions:  .  cyclobenzaprine (FLEXERIL) 10 MG tablet, Take 10 mg by mouth 3 (three) times daily as needed for muscle spasms., Disp: , Rfl:  .  lisinopril-hydrochlorothiazide (ZESTORETIC) 20-12.5 MG tablet, Take 1 tablet by mouth daily., Disp: 30 tablet, Rfl: 3 .  lovastatin (MEVACOR) 20 MG tablet, Take 10 mg by mouth at bedtime., Disp: , Rfl:  .  meloxicam (MOBIC) 7.5 MG tablet, Take 7.5 mg by mouth 2 (two) times daily as needed for pain. , Disp: , Rfl:    Review of Systems  Constitutional: Positive for diaphoresis and fatigue. Negative for appetite change, chills, fever and unexpected weight change.  HENT: Negative for congestion, drooling, ear pain, facial swelling, hearing loss, mouth sores, sneezing, sore throat, trouble swallowing and voice change.   Eyes: Negative for pain, discharge, redness, itching and visual disturbance.  Respiratory: Negative for cough, choking, shortness of breath and  wheezing.   Cardiovascular: Negative for chest pain, palpitations and leg swelling.  Gastrointestinal: Positive for abdominal pain. Negative for blood in stool, constipation, diarrhea and vomiting.  Endocrine: Negative for cold intolerance, heat intolerance and polydipsia.  Genitourinary: Negative for decreased urine volume, dysuria and hematuria.  Musculoskeletal: Positive for back pain. Negative for arthralgias and gait problem.  Skin: Negative for rash.  Allergic/Immunologic: Negative for environmental allergies.  Neurological: Negative for seizures, syncope, light-headedness and headaches.  Hematological: Negative for adenopathy.  Psychiatric/Behavioral: Negative for agitation, dysphoric mood and suicidal ideas. The patient is not nervous/anxious.     Per HPI unless specifically indicated above     Objective:    BP (!) 170/90 (BP Location: Left Arm, Patient Position: Sitting, Cuff Size: Normal)   Pulse 70   Temp (!) 97.3 F (36.3 C)   Ht 4' 10.25" (1.48 m)   Wt 158 lb (71.7 kg)   SpO2 98%   BMI 32.74 kg/m   Wt Readings from Last 3 Encounters:  06/03/17 158 lb (71.7 kg)  04/19/17 156 lb 8 oz (71 kg)  03/17/17 156 lb 8 oz (71 kg)    Physical Exam  Constitutional: She is oriented to person, place, and time. She appears well-developed and well-nourished.  HENT:  Head: Normocephalic and atraumatic.  Neck: Neck supple.  Cardiovascular: Normal rate and regular rhythm.   Pulmonary/Chest: Effort normal and breath sounds normal.  Abdominal: Soft. Bowel  sounds are normal. She exhibits no distension, no ascites and no mass. There is no hepatosplenomegaly. There is tenderness (very mild) in the right upper quadrant. There is no rigidity, no rebound, no guarding and no CVA tenderness.  Musculoskeletal: She exhibits no edema.  Lymphadenopathy:    She has no cervical adenopathy.  Neurological: She is alert and oriented to person, place, and time.  Skin: Skin is warm and dry.   Psychiatric: She has a normal mood and affect. Her behavior is normal.  Vitals reviewed.       Assessment & Plan:   Encounter Diagnosis  Name Primary?  . Alcohol abuse, in remission Yes     -pt counseled to take her bp medication regularly -pt appears well today -congratulated pt on abstainence  And encouraged her to stay away from etoh.  Discussed attending AA meetings -will review hospital records when they become available -pt to follow up In 2 weeks as scheduled.  RTO sooner prn

## 2017-06-18 ENCOUNTER — Other Ambulatory Visit (HOSPITAL_COMMUNITY)
Admission: RE | Admit: 2017-06-18 | Discharge: 2017-06-18 | Disposition: A | Discharge status: Home / Self Care | Payer: Self-pay | Source: Ambulatory Visit | Attending: Physician Assistant | Admitting: Physician Assistant

## 2017-06-18 DIAGNOSIS — E785 Hyperlipidemia, unspecified: Secondary | ICD-10-CM

## 2017-06-18 DIAGNOSIS — I10 Essential (primary) hypertension: Secondary | ICD-10-CM

## 2017-06-18 LAB — LIPID PANEL
Cholesterol: 157 mg/dL (ref 0–200)
HDL: 40 mg/dL — ABNORMAL LOW (ref >40)
LDL CALC: 92 mg/dL (ref 0–99)
Total CHOL/HDL Ratio: 3.9 RATIO
Triglycerides: 126 mg/dL (ref <150)
VLDL: 25 mg/dL (ref 0–40)

## 2017-06-18 LAB — COMPREHENSIVE METABOLIC PANEL
ALT: 18 U/L (ref 14–54)
ANION GAP: 8 (ref 5–15)
AST: 20 U/L (ref 15–41)
Albumin: 3.9 g/dL (ref 3.5–5.0)
Alkaline Phosphatase: 96 U/L (ref 38–126)
BUN: 14 mg/dL (ref 6–20)
CO2: 27 mmol/L (ref 22–32)
CREATININE: 0.85 mg/dL (ref 0.44–1.00)
Calcium: 8.9 mg/dL (ref 8.9–10.3)
Chloride: 101 mmol/L (ref 101–111)
Glucose, Bld: 102 mg/dL — ABNORMAL HIGH (ref 65–99)
POTASSIUM: 3.8 mmol/L (ref 3.5–5.1)
SODIUM: 136 mmol/L (ref 135–145)
Total Bilirubin: 0.6 mg/dL (ref 0.3–1.2)
Total Protein: 7.2 g/dL (ref 6.5–8.1)

## 2017-06-21 ENCOUNTER — Other Ambulatory Visit (HOSPITAL_COMMUNITY)
Admission: RE | Admit: 2017-06-21 | Discharge: 2017-06-21 | Disposition: A | Discharge status: Home / Self Care | Payer: No Typology Code available for payment source | Source: Ambulatory Visit | Attending: Physician Assistant | Admitting: Physician Assistant

## 2017-06-21 ENCOUNTER — Encounter: Payer: Self-pay | Admitting: Physician Assistant

## 2017-06-21 ENCOUNTER — Ambulatory Visit: Payer: Self-pay | Admitting: Physician Assistant

## 2017-06-21 VITALS — BP 140/84

## 2017-06-21 DIAGNOSIS — E785 Hyperlipidemia, unspecified: Secondary | ICD-10-CM

## 2017-06-21 DIAGNOSIS — I10 Essential (primary) hypertension: Secondary | ICD-10-CM

## 2017-06-21 DIAGNOSIS — Z124 Encounter for screening for malignant neoplasm of cervix: Secondary | ICD-10-CM | POA: Insufficient documentation

## 2017-06-21 DIAGNOSIS — F1011 Alcohol abuse, in remission: Secondary | ICD-10-CM

## 2017-06-21 NOTE — Progress Notes (Signed)
BP 140/84    Subjective:    Patient ID: Jasmine Davies, female    DOB: [birth year], 51 y.o.   MRN: [id number]  HPI: Jasmine Davies is a 51 y.o. female presenting on 06/21/2017 for Hypertension; Hyperlipidemia; and Gynecologic Exam   HPI  It's been 3 weeks with no alcohol and she is feeling a lot better.  Denies shakes or other symptoms of withdrawal.    Pt has appt for mammogram later this month.   Relevant past medical, surgical, family and social history reviewed and updated as indicated. Interim medical history since our last visit reviewed. Allergies and medications reviewed and updated.   Current Outpatient Medications:  .  lisinopril-hydrochlorothiazide (ZESTORETIC) 20-12.5 MG tablet, Take 1 tablet by mouth daily., Disp: 30 tablet, Rfl: 3 .  lovastatin (MEVACOR) 20 MG tablet, Take 10 mg by mouth at bedtime., Disp: , Rfl:    Review of Systems  Constitutional: Positive for diaphoresis, fatigue and unexpected weight change. Negative for appetite change, chills and fever.  HENT: Negative for congestion, dental problem, drooling, ear pain, facial swelling, hearing loss, mouth sores, sneezing, sore throat, trouble swallowing and voice change.   Eyes: Negative for pain, discharge, redness, itching and visual disturbance.  Respiratory: Positive for cough. Negative for choking, shortness of breath and wheezing.   Cardiovascular: Negative for chest pain, palpitations and leg swelling.  Gastrointestinal: Negative for abdominal pain, blood in stool, constipation, diarrhea and vomiting.  Endocrine: Negative for cold intolerance, heat intolerance and polydipsia.  Genitourinary: Negative for decreased urine volume, dysuria and hematuria.  Musculoskeletal: Positive for arthralgias and back pain. Negative for gait problem.  Skin: Negative for rash.  Allergic/Immunologic: Negative for environmental allergies.  Neurological: Negative for seizures, syncope, light-headedness and headaches.   Hematological: Negative for adenopathy.  Psychiatric/Behavioral: Positive for dysphoric mood. Negative for agitation and suicidal ideas. The patient is nervous/anxious.     Per HPI unless specifically indicated above     Objective:    BP 140/84   Wt Readings from Last 3 Encounters:  06/03/17 158 lb (71.7 kg)  04/19/17 156 lb 8 oz (71 kg)  03/17/17 156 lb 8 oz (71 kg)    Physical Exam  Constitutional: She is oriented to person, place, and time. She appears well-developed and well-nourished.  HENT:  Head: Normocephalic and atraumatic.  Neck: Neck supple.  Cardiovascular: Normal rate and regular rhythm.  Pulmonary/Chest: Effort normal and breath sounds normal.  Breast exam normal  Abdominal: Soft. Bowel sounds are normal. She exhibits no mass. There is no hepatosplenomegaly. There is no tenderness. There is no rebound and no guarding.  Genitourinary: Vagina normal and uterus normal. No breast swelling, tenderness, discharge or bleeding. There is no rash, tenderness or lesion on the right labia. There is no rash, tenderness or lesion on the left labia. Cervix exhibits no motion tenderness, no discharge and no friability. Right adnexum displays no mass, no tenderness and no fullness. Left adnexum displays no mass, no tenderness and no fullness.  Genitourinary Comments: (nurse Berenice assisted)  Musculoskeletal: She exhibits no edema.  Lymphadenopathy:    She has no cervical adenopathy.  Neurological: She is alert and oriented to person, place, and time.  Skin: Skin is warm and dry.  Psychiatric: She has a normal mood and affect. Her behavior is normal.  Nursing note and vitals reviewed.   Results for orders placed or performed during the hospital encounter of 06/18/17  Lipid Profile  Result Value Ref Range   Cholesterol  157 0 - 200 mg/dL   Triglycerides 161126 <096<150 mg/dL   HDL 40 (L) >04>40 mg/dL   Total CHOL/HDL Ratio 3.9 RATIO   VLDL 25 0 - 40 mg/dL   LDL Cholesterol 92 0 - 99  mg/dL  Comprehensive Metabolic Panel (CMET)  Result Value Ref Range   Sodium 136 135 - 145 mmol/L   Potassium 3.8 3.5 - 5.1 mmol/L   Chloride 101 101 - 111 mmol/L   CO2 27 22 - 32 mmol/L   Glucose, Bld 102 (H) 65 - 99 mg/dL   BUN 14 6 - 20 mg/dL   Creatinine, Ser 5.400.85 0.44 - 1.00 mg/dL   Calcium 8.9 8.9 - 98.110.3 mg/dL   Total Protein 7.2 6.5 - 8.1 g/dL   Albumin 3.9 3.5 - 5.0 g/dL   AST 20 15 - 41 U/L   ALT 18 14 - 54 U/L   Alkaline Phosphatase 96 38 - 126 U/L   Total Bilirubin 0.6 0.3 - 1.2 mg/dL   GFR calc non Af Amer >60 >60 mL/min   GFR calc Af Amer >60 >60 mL/min   Anion gap 8 5 - 15      Assessment & Plan:    Encounter Diagnoses  Name Primary?  . Routine Papanicolaou smear Yes  . Essential hypertension, benign   . Hyperlipidemia, unspecified hyperlipidemia type   . Alcohol abuse, in remission     -Reviewed labs with pt -pt to continue current medications -Mammogram later this month as scheduled -pt to continue abstaining from alcohol -pt to follow up 1 month to recheck BP.  RTO sooner prn

## 2017-06-28 ENCOUNTER — Ambulatory Visit
Admission: RE | Admit: 2017-06-28 | Discharge: 2017-06-28 | Disposition: A | Discharge status: Home / Self Care | Payer: No Typology Code available for payment source | Source: Ambulatory Visit | Attending: Physician Assistant | Admitting: Physician Assistant

## 2017-06-28 DIAGNOSIS — Z1231 Encounter for screening mammogram for malignant neoplasm of breast: Secondary | ICD-10-CM

## 2017-07-14 ENCOUNTER — Encounter: Payer: Self-pay | Admitting: Physician Assistant

## 2017-07-19 ENCOUNTER — Ambulatory Visit: Payer: Self-pay | Admitting: Physician Assistant

## 2017-07-26 ENCOUNTER — Ambulatory Visit: Payer: Self-pay | Admitting: Physician Assistant

## 2017-08-05 ENCOUNTER — Encounter: Payer: Self-pay | Admitting: Physician Assistant

## 2017-08-17 ENCOUNTER — Ambulatory Visit: Payer: Self-pay | Admitting: Physician Assistant

## 2017-08-17 ENCOUNTER — Encounter: Payer: Self-pay | Admitting: Physician Assistant

## 2017-08-17 VITALS — BP 130/70 | HR 94 | Temp 97.9°F | Ht 58.5 in | Wt 152.0 lb

## 2017-08-17 DIAGNOSIS — I10 Essential (primary) hypertension: Secondary | ICD-10-CM

## 2017-08-17 DIAGNOSIS — E785 Hyperlipidemia, unspecified: Secondary | ICD-10-CM

## 2017-08-17 DIAGNOSIS — M545 Low back pain, unspecified: Secondary | ICD-10-CM

## 2017-08-17 DIAGNOSIS — F1011 Alcohol abuse, in remission: Secondary | ICD-10-CM

## 2017-08-17 DIAGNOSIS — E669 Obesity, unspecified: Secondary | ICD-10-CM

## 2017-08-17 DIAGNOSIS — F17210 Nicotine dependence, cigarettes, uncomplicated: Secondary | ICD-10-CM

## 2017-08-17 NOTE — Patient Instructions (Signed)

## 2017-08-17 NOTE — Progress Notes (Signed)
BP 130/70 (BP Location: Left Arm, Patient Position: Sitting, Cuff Size: Normal)   Pulse 94   Temp 97.9 F (36.6 C)   Ht 4' 10.5" (1.486 m)   Wt 152 lb (68.9 kg)   SpO2 96%   BMI 31.23 kg/m    Subjective:    Patient ID: Jasmine SonsJanet M Shearn, female    DOB: [birth year], 52 y.o.   MRN: [id number]  HPI: Jasmine Davies is a 52 y.o. female presenting on 08/17/2017 for Hypertension   HPI   Pt is doing great.  She is still off EtOH.  She says that  Jan 19 will be 4 months.   Pt complains of occassional pain R lower back.  No injury. No radiation.   Relevant past medical, surgical, family and social history reviewed and updated as indicated. Interim medical history since our last visit reviewed. Allergies and medications reviewed and updated.    Current Outpatient Medications:  .  lisinopril-hydrochlorothiazide (ZESTORETIC) 20-12.5 MG tablet, Take 1 tablet by mouth daily., Disp: 30 tablet, Rfl: 3 .  lovastatin (MEVACOR) 20 MG tablet, Take 10 mg by mouth at bedtime. Take 1/2 tab (10mg ) by mouth at bedtime, Disp: , Rfl:   Review of Systems  Constitutional: Negative for appetite change, chills, diaphoresis, fatigue, fever and unexpected weight change.  HENT: Negative for congestion, dental problem, drooling, ear pain, facial swelling, hearing loss, mouth sores, sneezing, sore throat, trouble swallowing and voice change.   Eyes: Negative for pain, discharge, redness, itching and visual disturbance.  Respiratory: Positive for cough and wheezing. Negative for choking and shortness of breath.   Cardiovascular: Negative for chest pain, palpitations and leg swelling.  Gastrointestinal: Negative for abdominal pain, blood in stool, constipation, diarrhea and vomiting.  Endocrine: Negative for cold intolerance, heat intolerance and polydipsia.  Genitourinary: Negative for decreased urine volume, dysuria and hematuria.  Musculoskeletal: Negative for arthralgias, back pain and gait problem.  Skin: Negative  for rash.  Allergic/Immunologic: Negative for environmental allergies.  Neurological: Negative for seizures, syncope, light-headedness and headaches.  Hematological: Negative for adenopathy.  Psychiatric/Behavioral: Negative for agitation, dysphoric mood and suicidal ideas. The patient is not nervous/anxious.     Per HPI unless specifically indicated above     Objective:    BP 130/70 (BP Location: Left Arm, Patient Position: Sitting, Cuff Size: Normal)   Pulse 94   Temp 97.9 F (36.6 C)   Ht 4' 10.5" (1.486 m)   Wt 152 lb (68.9 kg)   SpO2 96%   BMI 31.23 kg/m   Wt Readings from Last 3 Encounters:  08/17/17 152 lb (68.9 kg)  06/03/17 158 lb (71.7 kg)  04/19/17 156 lb 8 oz (71 kg)    Physical Exam  Constitutional: She is oriented to person, place, and time. She appears well-developed and well-nourished.  HENT:  Head: Normocephalic and atraumatic.  Neck: Neck supple.  Cardiovascular: Normal rate and regular rhythm.  Pulmonary/Chest: Effort normal and breath sounds normal.  Abdominal: Soft. Bowel sounds are normal. She exhibits no pulsatile midline mass and no mass. There is no hepatosplenomegaly. There is no tenderness. There is no CVA tenderness.  Musculoskeletal: She exhibits no edema.       Lumbar back: She exhibits pain. She exhibits normal range of motion, no tenderness, no bony tenderness, no swelling, no edema and no deformity.       Back:  Lymphadenopathy:    She has no cervical adenopathy.  Neurological: She is alert and oriented to person, place, and  time.  Skin: Skin is warm and dry.  Psychiatric: She has a normal mood and affect. Her behavior is normal.  Vitals reviewed.       Assessment & Plan:    Encounter Diagnoses  Name Primary?  . Essential hypertension, benign Yes  . Alcohol abuse, in remission   . Cigarette nicotine dependence without complication   . Hyperlipidemia, unspecified hyperlipidemia type   . Obesity, unspecified classification,  unspecified obesity type, unspecified whether serious comorbidity present   . Right-sided low back pain without sciatica, unspecified chronicity     -recommended Back exercises and heat prn -pt to continue current medication -congratulate pt on abstainance and encouraged her to continue to avoid eoth -pt to follow up 2 months.  RTO sooner prn

## 2017-10-18 ENCOUNTER — Ambulatory Visit: Payer: Self-pay | Admitting: Physician Assistant

## 2017-10-25 ENCOUNTER — Encounter: Payer: Self-pay | Admitting: Physician Assistant

## 2018-10-26 ENCOUNTER — Other Ambulatory Visit: Payer: Self-pay

## 2018-10-26 ENCOUNTER — Encounter (HOSPITAL_COMMUNITY): Payer: Self-pay | Admitting: Emergency Medicine

## 2018-10-26 ENCOUNTER — Emergency Department (HOSPITAL_COMMUNITY)
Admission: EM | Admit: 2018-10-26 | Discharge: 2018-10-26 | Disposition: A | Discharge status: Home / Self Care | Payer: No Typology Code available for payment source | Attending: Emergency Medicine | Admitting: Emergency Medicine

## 2018-10-26 DIAGNOSIS — S1086XA Insect bite of other specified part of neck, initial encounter: Secondary | ICD-10-CM | POA: Insufficient documentation

## 2018-10-26 DIAGNOSIS — I10 Essential (primary) hypertension: Secondary | ICD-10-CM | POA: Insufficient documentation

## 2018-10-26 DIAGNOSIS — W57XXXA Bitten or stung by nonvenomous insect and other nonvenomous arthropods, initial encounter: Secondary | ICD-10-CM | POA: Insufficient documentation

## 2018-10-26 DIAGNOSIS — Y999 Unspecified external cause status: Secondary | ICD-10-CM | POA: Insufficient documentation

## 2018-10-26 DIAGNOSIS — R202 Paresthesia of skin: Secondary | ICD-10-CM | POA: Insufficient documentation

## 2018-10-26 DIAGNOSIS — Y939 Activity, unspecified: Secondary | ICD-10-CM | POA: Insufficient documentation

## 2018-10-26 DIAGNOSIS — Y929 Unspecified place or not applicable: Secondary | ICD-10-CM | POA: Insufficient documentation

## 2018-10-26 DIAGNOSIS — R197 Diarrhea, unspecified: Secondary | ICD-10-CM | POA: Insufficient documentation

## 2018-10-26 MED ORDER — DOXYCYCLINE HYCLATE 100 MG PO CAPS: 100.0000 mg | ORAL_CAPSULE | Freq: Two times a day (BID) | ORAL | 0 refills | Status: DC

## 2018-10-26 NOTE — ED Triage Notes (Signed)
Tick bite on left side of the neck 2 days ago, now having left arm numbness

## 2018-10-26 NOTE — ED Provider Notes (Signed)
[facility] EMERGENCY DEPARTMENT Provider Note   CSN: [id number] Arrival date & time: 10/26/18  1316    History   Chief Complaint Chief Complaint  Patient presents with  . tick bite  . arm numbness    HPI Jasmine Davies is a 53 y.o. female.     HPI Patient presents with tick bite and left arm numbness.  States around 2 days ago she found a tick on her left neck.  Unsure how long it was there.  Since then has developed some numbness in her left arm.  States that the arm feels numb but she still has a strength.  Has not had problems with this arm before.  No fall.  Does have a history of disc disease in the neck.  No fall.  States she has had some chills.  States she is had some diarrhea and just feels bad.  No headache.  No confusion.  No rash. Past Medical History:  Diagnosis Date  . Chronic back pain   . Chronic pain in left foot   . Cleft foot L foot  . Degenerative joint disease   . Depression 2003  . Hernia, hiatal   . Hiatal hernia   . Hypertension   . PUD (peptic ulcer disease)    ? ETOH and ASA use  . Sciatica     Patient Active Problem List   Diagnosis Date Noted  . Left foot pain 12/23/2015  . Cigarette nicotine dependence without complication 12/23/2015  . Essential hypertension, benign 12/23/2015  . History of alcohol abuse 12/23/2015  . Abdominal pain, other specified site 09/20/2012  . Nausea with vomiting 09/20/2012  . Abdominal bloating 12/09/2011  . PUD (peptic ulcer disease) 12/09/2011  . Epigastric pain 10/01/2011  . Hematemesis 10/01/2011  . Abnormal CT scan, chest 10/01/2011  . Abnormal CT scan 10/01/2011    Past Surgical History:  Procedure Laterality Date  . CESAREAN SECTION     x2  . CHOLECYSTECTOMY  1993  . CLUB FOOT RELEASE     left foot  . ESOPHAGOGASTRODUODENOSCOPY  10/06/2011   ulcers,multiple in antrum and duodenitis/hiatal hernia  . ESOPHAGOGASTRODUODENOSCOPY  12/15/2011   SLF: Moderate gastritis/ Hiatal hernia/PUD IMPROVED BUT  NOT RESOLVED DUE TO ONGOING ASA USE  . TUBAL LIGATION  1999     OB History   No obstetric history on file.      Home Medications    Prior to Admission medications   Medication Sig Start Date End Date Taking? Authorizing Provider  doxycycline (VIBRAMYCIN) 100 MG capsule Take 1 capsule (100 mg total) by mouth 2 (two) times daily. 10/26/18   Benjiman Core, MD  lisinopril-hydrochlorothiazide (ZESTORETIC) 20-12.5 MG tablet Take 1 tablet by mouth daily. 04/19/17   Jacquelin Hawking, PA-C  lovastatin (MEVACOR) 20 MG tablet Take 10 mg by mouth at bedtime. Take 1/2 tab (10mg ) by mouth at bedtime    [provider]    Family History Family History  Problem Relation Age of Onset  . Hypertension Mother   . Heart failure Father        CHF  . Alcohol abuse Father   . Cancer Father        Stomach Cancer  . Colon cancer Neg Hx     Social History Social History   Tobacco Use  . Smoking status: Current Every Day Smoker    Packs/day: 0.25    Years: 36.00    Pack years: 9.00    Types: Cigarettes  .  Smokeless tobacco: Never Used  Substance Use Topics  . Alcohol use: Yes    Alcohol/week: 67.0 standard drinks    Types: 67 Cans of beer per week    Comment: 40 oz beer every other day  . Drug use: No    Types: Marijuana    Comment: last marijuana use 2017     Allergies   Penicillins   Review of Systems Review of Systems  Constitutional: Positive for appetite change and chills.  HENT: Negative for congestion.   Respiratory: Negative for shortness of breath.   Cardiovascular: Negative for chest pain.  Gastrointestinal: Positive for diarrhea.  Genitourinary: Negative for flank pain.  Musculoskeletal: Positive for neck pain. Negative for back pain.  Skin: Negative for rash.  Neurological: Positive for numbness. Negative for headaches.  Psychiatric/Behavioral: Negative for confusion.     Physical Exam Updated Vital Signs BP (!) 153/85 (BP Location: Right Arm)    Pulse 89   Temp 98.1 F (36.7 C) (Temporal)   Resp 16   Ht 4\' 11"  (1.499 m)   Wt 61.2 kg   SpO2 99%   BMI 27.27 kg/m   Physical Exam Vitals signs and nursing note reviewed.  HENT:     Head: Normocephalic and atraumatic.     Mouth/Throat:     Mouth: Mucous membranes are moist.  Eyes:     Extraocular Movements: Extraocular movements intact.     Pupils: Pupils are equal, round, and reactive to light.  Neck:     Musculoskeletal: Neck supple.     Comments: Small erythematous area on left lateral posterior neck that was likely the site of the tick.  No surrounding rash. Cardiovascular:     Rate and Rhythm: Regular rhythm.  Pulmonary:     Effort: Pulmonary effort is normal.  Abdominal:     Tenderness: There is no abdominal tenderness.  Musculoskeletal:     Right lower leg: No edema.     Left lower leg: No edema.  Skin:    General: Skin is warm.     Capillary Refill: Capillary refill takes less than 2 seconds.  Neurological:     Mental Status: She is alert.     Comments: Patient states the left upper extremity is completely numb, however she is differentiating between left and right soft touch on physical examination.  Good strength bilaterally.  Good grip strength.  Good flexion extension of the upper extremity.  Psychiatric:        Mood and Affect: Mood normal.      ED Treatments / Results  Labs (all labs ordered are listed, but only abnormal results are displayed) Labs Reviewed - No data to display  EKG None  Radiology No results found.  Procedures Procedures (including critical care time)  Medications Ordered in ED Medications - No data to display   Initial Impression / Assessment and Plan / ED Course  I have reviewed the triage vital signs and the nursing notes.  Pertinent labs & imaging results that were available during my care of the patient were reviewed by me and considered in my medical decision making (see chart for details).        Patient  presents with likely tick bite.  Has had some diarrhea.  And chills.  With a recent tick bite will empirically treat for infection.  Doxycycline to be given.  Does have the paresthesias to left upper extremity.  Appears to be just sensation whole hand on the upper extremity.  Has not  had this before.  Doubt this is ischemic.  However will treat the potential other infection and will need outpatient follow-up as needed.  MS tumor and other cause considered but felt less likely.  Final Clinical Impressions(s) / ED Diagnoses   Final diagnoses:  Tick bite, initial encounter  Diarrhea, unspecified type  Paresthesias    ED Discharge Orders         Ordered    doxycycline (VIBRAMYCIN) 100 MG capsule  2 times daily     10/26/18 1348           Benjiman Core, MD 10/26/18 1409

## 2019-07-02 ENCOUNTER — Emergency Department (HOSPITAL_COMMUNITY): Payer: No Typology Code available for payment source

## 2019-07-02 ENCOUNTER — Inpatient Hospital Stay (HOSPITAL_COMMUNITY)
Admission: EM | Admit: 2019-07-02 | Discharge: 2019-07-04 | DRG: 305 | Disposition: A | Discharge status: Home / Self Care | Payer: Self-pay | Attending: Internal Medicine | Admitting: Internal Medicine

## 2019-07-02 ENCOUNTER — Encounter (HOSPITAL_COMMUNITY): Payer: Self-pay

## 2019-07-02 ENCOUNTER — Other Ambulatory Visit: Payer: Self-pay

## 2019-07-02 DIAGNOSIS — R05 Cough: Secondary | ICD-10-CM

## 2019-07-02 DIAGNOSIS — Z9049 Acquired absence of other specified parts of digestive tract: Secondary | ICD-10-CM

## 2019-07-02 DIAGNOSIS — K449 Diaphragmatic hernia without obstruction or gangrene: Secondary | ICD-10-CM | POA: Diagnosis present

## 2019-07-02 DIAGNOSIS — Z8711 Personal history of peptic ulcer disease: Secondary | ICD-10-CM

## 2019-07-02 DIAGNOSIS — F17210 Nicotine dependence, cigarettes, uncomplicated: Secondary | ICD-10-CM | POA: Diagnosis present

## 2019-07-02 DIAGNOSIS — Z9114 Patient's other noncompliance with medication regimen: Secondary | ICD-10-CM

## 2019-07-02 DIAGNOSIS — Z8249 Family history of ischemic heart disease and other diseases of the circulatory system: Secondary | ICD-10-CM

## 2019-07-02 DIAGNOSIS — F329 Major depressive disorder, single episode, unspecified: Secondary | ICD-10-CM | POA: Diagnosis present

## 2019-07-02 DIAGNOSIS — M79672 Pain in left foot: Secondary | ICD-10-CM | POA: Diagnosis present

## 2019-07-02 DIAGNOSIS — Z880 Allergy status to penicillin: Secondary | ICD-10-CM

## 2019-07-02 DIAGNOSIS — F1010 Alcohol abuse, uncomplicated: Secondary | ICD-10-CM | POA: Diagnosis present

## 2019-07-02 DIAGNOSIS — M5430 Sciatica, unspecified side: Secondary | ICD-10-CM | POA: Diagnosis present

## 2019-07-02 DIAGNOSIS — Z7982 Long term (current) use of aspirin: Secondary | ICD-10-CM

## 2019-07-02 DIAGNOSIS — R059 Cough, unspecified: Secondary | ICD-10-CM

## 2019-07-02 DIAGNOSIS — Z811 Family history of alcohol abuse and dependence: Secondary | ICD-10-CM

## 2019-07-02 DIAGNOSIS — G459 Transient cerebral ischemic attack, unspecified: Secondary | ICD-10-CM | POA: Diagnosis present

## 2019-07-02 DIAGNOSIS — M1990 Unspecified osteoarthritis, unspecified site: Secondary | ICD-10-CM | POA: Diagnosis present

## 2019-07-02 DIAGNOSIS — E871 Hypo-osmolality and hyponatremia: Secondary | ICD-10-CM | POA: Diagnosis present

## 2019-07-02 DIAGNOSIS — I161 Hypertensive emergency: Principal | ICD-10-CM | POA: Diagnosis present

## 2019-07-02 DIAGNOSIS — R200 Anesthesia of skin: Secondary | ICD-10-CM

## 2019-07-02 DIAGNOSIS — Z20828 Contact with and (suspected) exposure to other viral communicable diseases: Secondary | ICD-10-CM | POA: Diagnosis present

## 2019-07-02 DIAGNOSIS — I169 Hypertensive crisis, unspecified: Secondary | ICD-10-CM | POA: Diagnosis present

## 2019-07-02 DIAGNOSIS — Z800 Family history of malignant neoplasm of digestive organs: Secondary | ICD-10-CM

## 2019-07-02 DIAGNOSIS — G8929 Other chronic pain: Secondary | ICD-10-CM | POA: Diagnosis present

## 2019-07-02 DIAGNOSIS — I10 Essential (primary) hypertension: Secondary | ICD-10-CM | POA: Diagnosis present

## 2019-07-02 LAB — COMPREHENSIVE METABOLIC PANEL
ALT: 15 U/L (ref 0–44)
AST: 21 U/L (ref 15–41)
Albumin: 3.9 g/dL (ref 3.5–5.0)
Alkaline Phosphatase: 70 U/L (ref 38–126)
Anion gap: 10 (ref 5–15)
BUN: 9 mg/dL (ref 6–20)
CO2: 21 mmol/L — ABNORMAL LOW (ref 22–32)
Calcium: 8.1 mg/dL — ABNORMAL LOW (ref 8.9–10.3)
Chloride: 102 mmol/L (ref 98–111)
Creatinine, Ser: 0.75 mg/dL (ref 0.44–1.00)
GFR calc Af Amer: >60 mL/min (ref >60)
GFR calc non Af Amer: >60 mL/min (ref >60)
Glucose, Bld: 110 mg/dL — ABNORMAL HIGH (ref 70–99)
Potassium: 3.6 mmol/L (ref 3.5–5.1)
Sodium: 133 mmol/L — ABNORMAL LOW (ref 135–145)
Total Bilirubin: 1 mg/dL (ref 0.3–1.2)
Total Protein: 7 g/dL (ref 6.5–8.1)

## 2019-07-02 LAB — CBC
HCT: 44.3 % (ref 36.0–46.0)
Hemoglobin: 15.1 g/dL — ABNORMAL HIGH (ref 12.0–15.0)
MCH: 34.2 pg — ABNORMAL HIGH (ref 26.0–34.0)
MCHC: 34.1 g/dL (ref 30.0–36.0)
MCV: 100.5 fL — ABNORMAL HIGH (ref 80.0–100.0)
Platelets: 241 10*3/uL (ref 150–400)
RBC: 4.41 MIL/uL (ref 3.87–5.11)
RDW: 12 % (ref 11.5–15.5)
WBC: 10.5 10*3/uL (ref 4.0–10.5)
nRBC: 0 % (ref 0.0–0.2)

## 2019-07-02 LAB — TROPONIN I (HIGH SENSITIVITY)
Troponin I (High Sensitivity): 8 ng/L (ref <18)
Troponin I (High Sensitivity): 9 ng/L (ref <18)

## 2019-07-02 MED ORDER — SENNOSIDES-DOCUSATE SODIUM 8.6-50 MG PO TABS
1.0000 | ORAL_TABLET | Freq: Every evening | ORAL | Status: DC | PRN
  Filled 2019-07-02: qty 1

## 2019-07-02 MED ORDER — SODIUM CHLORIDE 0.9 % IV SOLN
INTRAVENOUS | Status: DC
  Administered 2019-07-02: via INTRAVENOUS

## 2019-07-02 MED ORDER — IPRATROPIUM-ALBUTEROL 0.5-2.5 (3) MG/3ML IN SOLN: 3.0000 mL | Freq: Four times a day (QID) | RESPIRATORY_TRACT | Status: DC

## 2019-07-02 MED ORDER — LISINOPRIL 10 MG PO TABS
10.0000 mg | ORAL_TABLET | Freq: Every day | ORAL | Status: DC
  Administered 2019-07-03: 10 mg via ORAL
  Filled 2019-07-02: qty 1

## 2019-07-02 MED ORDER — ACETAMINOPHEN 650 MG RE SUPP: 650.0000 mg | RECTAL | Status: DC | PRN

## 2019-07-02 MED ORDER — ACETAMINOPHEN 325 MG PO TABS
650.0000 mg | ORAL_TABLET | ORAL | Status: DC | PRN
  Administered 2019-07-03 – 2019-07-04 (×4): 650 mg via ORAL
  Filled 2019-07-02 (×4): qty 2

## 2019-07-02 MED ORDER — NICOTINE 14 MG/24HR TD PT24
14.0000 mg | MEDICATED_PATCH | Freq: Every day | TRANSDERMAL | Status: DC
  Administered 2019-07-03 – 2019-07-04 (×2): 14 mg via TRANSDERMAL
  Filled 2019-07-02 (×2): qty 1

## 2019-07-02 MED ORDER — HEPARIN SODIUM (PORCINE) 5000 UNIT/ML IJ SOLN
5000.0000 [IU] | Freq: Three times a day (TID) | INTRAMUSCULAR | Status: DC
  Administered 2019-07-03 – 2019-07-04 (×3): 5000 [IU] via SUBCUTANEOUS
  Filled 2019-07-02 (×3): qty 1

## 2019-07-02 MED ORDER — STROKE: EARLY STAGES OF RECOVERY BOOK
Freq: Once | Status: AC
  Administered 2019-07-03: 07:00:00
  Filled 2019-07-02: qty 1

## 2019-07-02 MED ORDER — ACETAMINOPHEN 160 MG/5ML PO SOLN: 650.0000 mg | ORAL | Status: DC | PRN

## 2019-07-02 MED ORDER — IOHEXOL 350 MG/ML SOLN
100.0000 mL | Freq: Once | INTRAVENOUS | Status: AC | PRN
  Administered 2019-07-02: 100 mL via INTRAVENOUS

## 2019-07-02 MED ORDER — ASPIRIN 81 MG PO CHEW
324.0000 mg | CHEWABLE_TABLET | Freq: Once | ORAL | Status: AC
  Administered 2019-07-02: 17:00:00 324 mg via ORAL
  Filled 2019-07-02: qty 4

## 2019-07-02 NOTE — ED Triage Notes (Signed)
Pt reports intermittent left sided chest pain x 2 weeks.  Reports woke up at 0830 this morning with numbness in left arm.  Denies any cough, fever, sob.  Reports fatigue.

## 2019-07-02 NOTE — ED Provider Notes (Signed)
[facility] EMERGENCY DEPARTMENT Provider Note   CSN: [id number] Arrival date & time: 07/02/19  1636     History   Chief Complaint Chief Complaint  Patient presents with   Chest Pain    HPI Jasmine Davies is a 53 y.o. female.  HPI: A 54 year old patient with a history of hypertension presents for evaluation of chest pain. Initial onset of pain was less than one hour ago. The patient's chest pain is not worse with exertion. The patient's chest pain is middle- or left-sided, is not well-localized, is not described as heaviness/pressure/tightness, is not sharp and does radiate to the arms/jaw/neck. The patient does not complain of nausea and denies diaphoresis. The patient has smoked in the past 90 days. The patient has no history of stroke, has no history of peripheral artery disease, denies any history of treated diabetes, has no relevant family history of coronary artery disease (first degree relative at less than age 71), has no history of hypercholesterolemia and does not have an elevated BMI (>=30).   The history is provided by the patient.  Chest Pain Pain location:  L chest Pain quality: aching and pressure   Pain radiates to:  L arm Pain severity:  Moderate Onset quality:  Gradual Duration:  2 weeks Timing:  Constant Progression:  Unchanged Chronicity:  New Context: not breathing, not drug use, not eating, not intercourse, not lifting, not movement, not raising an arm, not at rest, not stress and not trauma   Relieved by:  Nothing Worsened by:  Nothing Ineffective treatments:  None tried Associated symptoms: numbness (left arm- onset today at 8:30 am)   Associated symptoms: no abdominal pain, no AICD problem, no altered mental status, no anorexia, no anxiety, no back pain, no claudication, no cough, no diaphoresis, no dizziness, no dysphagia, no fatigue, no fever, no headache, no heartburn, no lower extremity edema, no nausea, no near-syncope, no orthopnea, no palpitations,  no PND, no shortness of breath, no syncope, no vomiting and no weakness   Risk factors: hypertension and smoking     Past Medical History:  Diagnosis Date   Chronic back pain    Chronic pain in left foot    Cleft foot L foot   Degenerative joint disease    Depression 2003   Hernia, hiatal    Hiatal hernia    Hypertension    PUD (peptic ulcer disease)    ? ETOH and ASA use   Sciatica     Patient Active Problem List   Diagnosis Date Noted   Left foot pain 12/23/2015   Cigarette nicotine dependence without complication 12/23/2015   Essential hypertension, benign 12/23/2015   History of alcohol abuse 12/23/2015   Abdominal pain, other specified site 09/20/2012   Nausea with vomiting 09/20/2012   Abdominal bloating 12/09/2011   PUD (peptic ulcer disease) 12/09/2011   Epigastric pain 10/01/2011   Hematemesis 10/01/2011   Abnormal CT scan, chest 10/01/2011   Abnormal CT scan 10/01/2011    Past Surgical History:  Procedure Laterality Date   CESAREAN SECTION     x2   CHOLECYSTECTOMY  1993   CLUB FOOT RELEASE     left foot   ESOPHAGOGASTRODUODENOSCOPY  10/06/2011   ulcers,multiple in antrum and duodenitis/hiatal hernia   ESOPHAGOGASTRODUODENOSCOPY  12/15/2011   SLF: Moderate gastritis/ Hiatal hernia/PUD IMPROVED BUT NOT RESOLVED DUE TO ONGOING ASA USE   TUBAL LIGATION  1999     OB History   No obstetric history on file.  Home Medications    Prior to Admission medications   Medication Sig Start Date End Date Taking? Authorizing Provider  Aspirin-Acetaminophen-Caffeine (GOODY HEADACHE PO) Take 1 packet by mouth 2 (two) times daily as needed (for headache).   Yes [provider]    Family History Family History  Problem Relation Age of Onset   Hypertension Mother    Heart failure Father        CHF   Alcohol abuse Father    Cancer Father        Stomach Cancer   Colon cancer Neg Hx     Social History Social History    Tobacco Use   Smoking status: Current Every Day Smoker    Packs/day: 0.25    Years: 36.00    Pack years: 9.00    Types: Cigarettes   Smokeless tobacco: Never Used  Substance Use Topics   Alcohol use: Yes    Alcohol/week: 67.0 standard drinks    Types: 67 Cans of beer per week    Comment: 40 oz beer every other day   Drug use: Not Currently    Types: Marijuana     Allergies   Penicillins   Review of Systems Review of Systems  Constitutional: Negative for diaphoresis, fatigue and fever.  HENT: Negative for trouble swallowing.   Respiratory: Positive for wheezing. Negative for cough and shortness of breath.   Cardiovascular: Positive for chest pain. Negative for palpitations, orthopnea, claudication, syncope, PND and near-syncope.  Gastrointestinal: Negative for abdominal pain, anorexia, heartburn, nausea and vomiting.  Musculoskeletal: Negative for back pain.  Neurological: Positive for numbness (left arm- onset today at 8:30 am). Negative for dizziness, weakness and headaches.     Physical Exam Updated Vital Signs BP (!) 167/85    Pulse 71    Temp (!) 97.5 F (36.4 C) (Oral)    Resp (!) 28    Ht  (1.499 m)    Wt 61.2 kg    SpO2 98%    BMI 27.27 kg/m   Physical Exam Vitals signs and nursing note reviewed.  Constitutional:      General: She is not in acute distress.    Appearance: She is well-developed. She is not diaphoretic.     Comments: Appears older than stated age.  HENT:     Head: Normocephalic and atraumatic.  Eyes:     General: No scleral icterus.    Conjunctiva/sclera: Conjunctivae normal.  Neck:     Musculoskeletal: Normal range of motion.  Cardiovascular:     Rate and Rhythm: Normal rate and regular rhythm.     Heart sounds: Normal heart sounds. No murmur. No friction rub. No gallop.   Pulmonary:     Effort: Pulmonary effort is normal. No respiratory distress.     Breath sounds: Wheezing (diffuse expiratory wheezes) present.  Abdominal:      General: Bowel sounds are normal. There is no distension.     Palpations: Abdomen is soft. There is no mass.     Tenderness: There is no abdominal tenderness. There is no guarding.  Skin:    General: Skin is warm and dry.  Neurological:     Mental Status: She is alert and oriented to person, place, and time.  Psychiatric:        Behavior: Behavior normal.      ED Treatments / Results  Labs (all labs ordered are listed, but only abnormal results are displayed) Labs Reviewed  CBC - Abnormal; Notable for the following  components:      Result Value   Hemoglobin 15.1 (*)    MCV 100.5 (*)    MCH 34.2 (*)    All other components within normal limits  COMPREHENSIVE METABOLIC PANEL - Abnormal; Notable for the following components:   Sodium 133 (*)    CO2 21 (*)    Glucose, Bld 110 (*)    Calcium 8.1 (*)    All other components within normal limits  TROPONIN I (HIGH SENSITIVITY)  TROPONIN I (HIGH SENSITIVITY)    EKG EKG Interpretation  Date/Time:  Sunday July 02 2019 17:04:34 EST Ventricular Rate:  67 PR Interval:    QRS Duration: 78 QT Interval:  418 QTC Calculation: 442 R Axis:   26 Text Interpretation: Sinus rhythm Borderline short PR interval Confirmed by Kennis Carina [phone]) on 07/02/2019 5:08:53 PM   Radiology Ct Head Wo Contrast  Result Date: 07/02/2019 CLINICAL DATA:  Left-sided chest pain and left arm numbness EXAM: CT HEAD WITHOUT CONTRAST TECHNIQUE: Contiguous axial images were obtained from the base of the skull through the vertex without intravenous contrast. COMPARISON:  None. FINDINGS: Brain: No evidence of acute infarction, hemorrhage, hydrocephalus, extra-axial collection or mass lesion/mass effect. A few scattered small lacunar infarcts are noted within the deep white matter bilaterally. These appear chronic. Vascular: No hyperdense vessel or unexpected calcification. Skull: Normal. Negative for fracture or focal lesion. Sinuses/Orbits: No acute  finding. Other: None. IMPRESSION: Mild chronic ischemic changes without acute abnormality. Electronically Signed   By: Alcide Clever M.D.   On: 07/02/2019 19:14   Dg Chest Port 1 View  Result Date: 07/02/2019 CLINICAL DATA:  Chest pain EXAM: PORTABLE CHEST 1 VIEW COMPARISON:  03/04/2015 chest radiograph. FINDINGS: Stable cardiomediastinal silhouette with normal heart size. No pneumothorax. No pleural effusion. Lungs appear clear, with no acute consolidative airspace disease and no pulmonary edema. Cholecystectomy clips are seen in the right upper quadrant of the abdomen. IMPRESSION: No active disease. Electronically Signed   By: Delbert Phenix M.D.   On: 07/02/2019 17:26   Ct Angio Chest/abd/pel For Dissection W And/or Wo Contrast  Result Date: 07/02/2019 CLINICAL DATA:  Left-sided chest pain x2 weeks now with numbness and tingling. EXAM: CT ANGIOGRAPHY CHEST, ABDOMEN AND PELVIS TECHNIQUE: Multidetector CT imaging through the chest, abdomen and pelvis was performed using the standard protocol during bolus administration of intravenous contrast. Multiplanar reconstructed images and MIPs were obtained and reviewed to evaluate the vascular anatomy. CONTRAST:  OMNIPAQUE IOHEXOL 350 MG/ML SOLN COMPARISON:  CT abdomen pelvis dated 09/29/2012. CT chest dated September 08, 2012. FINDINGS: CTA CHEST FINDINGS Cardiovascular: There is no evidence for an aortic dissection. There is no thoracic aortic aneurysm. The main pulmonary artery is not significantly dilated. There is no large centrally located pulmonary embolism. The heart size is normal. There is no significant pericardial effusion. Mediastinum/Nodes: --No mediastinal or hilar lymphadenopathy. --No axillary lymphadenopathy. --No supraclavicular lymphadenopathy. --Normal thyroid gland. --The esophagus is unremarkable Lungs/Pleura: No pulmonary nodules or masses. There is a small opacity measuring approximately 7 mm in the medial left lower lobe favored to  represent atelectasis. No pleural effusion or pneumothorax. No focal airspace consolidation. No focal pleural abnormality. Musculoskeletal: No chest wall abnormality. No acute or significant osseous findings. Review of the MIP images confirms the above findings. CTA ABDOMEN AND PELVIS FINDINGS VASCULAR Aorta: Normal caliber aorta without aneurysm, dissection, vasculitis or significant stenosis. Celiac: Patent without evidence of aneurysm, dissection, vasculitis or significant stenosis. SMA: Patent without evidence of aneurysm, dissection,  vasculitis or significant stenosis. Renals: Both renal arteries are patent without evidence of aneurysm, dissection, vasculitis, fibromuscular dysplasia or significant stenosis. IMA: Patent without evidence of aneurysm, dissection, vasculitis or significant stenosis. Inflow: Patent without evidence of aneurysm, dissection, vasculitis or significant stenosis. Veins: No obvious venous abnormality within the limitations of this arterial phase study. Review of the MIP images confirms the above findings. NON-VASCULAR Hepatobiliary: The liver is normal. Status post cholecystectomy.There is no biliary ductal dilation. Pancreas: Normal contours without ductal dilatation. No peripancreatic fluid collection. Spleen: No splenic laceration or hematoma. Adrenals/Urinary Tract: --Adrenal glands: No adrenal hemorrhage. --Right kidney/ureter: No hydronephrosis or perinephric hematoma. --Left kidney/ureter: No hydronephrosis or perinephric hematoma. --Urinary bladder: Unremarkable. Stomach/Bowel: --Stomach/Duodenum: No hiatal hernia or other gastric abnormality. Normal duodenal course and caliber. --Small bowel: No dilatation or inflammation. --Colon: There are air-fluid levels in the colon consistent with liquid stool. --Appendix: Normal. Lymphatic: --No retroperitoneal lymphadenopathy. --No mesenteric lymphadenopathy. --No pelvic or inguinal lymphadenopathy. Reproductive: Unremarkable Other: No  ascites or free air. The abdominal wall is normal. Musculoskeletal. No acute displaced fractures. Review of the MIP images confirms the above findings. IMPRESSION: 1. No acute thoracic, abdominal or pelvic pathology. Specifically, no evidence for aortic dissection. 2. No large centrally located pulmonary embolism. 3. There are air-fluid levels in the colon consistent with liquid stool. Electronically Signed   By: Constance Holster M.D.   On: 07/02/2019 19:12    Procedures Procedures (including critical care time)  Medications Ordered in ED Medications  aspirin chewable tablet 324 mg (324 mg Oral Given 07/02/19 1709)  iohexol (OMNIPAQUE) 350 MG/ML injection 100 mL (100 mLs Intravenous Contrast Given 07/02/19 1848)     Initial Impression / Assessment and Plan / ED Course  I have reviewed the triage vital signs and the nursing notes.  Pertinent labs & imaging results that were available during my care of the patient were reviewed by me and considered in my medical decision making (see chart for details).  Clinical Course as of Jul 01 2036  Sun Jul 02, 2019  1901 Hemoglobin(!): 15.1 [AH]  1901 Sodium(!): 133 [AH]    Clinical Course User Index [AH] Margarita Mail, PA-C    HEAR Score: 3  CC: Chest pain and left arm numbness VS:  Vitals:   07/02/19 2015 07/02/19 2030 07/02/19 2100 07/02/19 2115  BP:  (!) 167/85 (!) 159/87   Pulse: 73 71 65 71  Resp: 19 (!) 28 18 (!) 24  Temp:      TempSrc:      SpO2: 98% 98% 97% 97%  Weight:      Height:        PP:JKDTOIZ is gathered by patient, EMR. DDX:The emergent differential diagnosis for Oretta Berkland Capili includes: Acute coronary syndrome, pericarditis, aortic dissection, pulmonary embolism, tension pneumothorax, pneumonia, and esophageal rupture and stroke Labs: I reviewed the labs which show patient CBC shows polycythemia likely secondary to her tobacco abuse.  Macrocytosis without anemia.  2 - high-sensitivity troponins over 2 hours.  CBC  shows some mild hyponatremia, elevated blood glucose of insignificant value. Imaging: I personally reviewed the images (CT head, CT angio chest abdomen and pelvis and 1 view chest x-ray) which show(s) chest x-ray and CT angio gram of the chest abdomen pelvis showed no acute abnormalities.  CT head shows chronic microvascular ischemic changes without stroke on my interpretation. EKG: Normal sinus rhythm at a rate of 67 without ischemic changes noted MDM: Patient here with chest pain, left arm numbness.  Differential  diagnosis includes acute coronary syndrome, aortic dissection, stroke.  Patient's heart score is 3 with 2 - troponins and have low suspicion for ACS as a cause of her symptoms today.  She is notably hypertensive however this has down trended over time without intervention.  Do not feel that the patient is having hypertensive emergency symptoms.  [facility] the [facility] who feels this may represent a thalamic stroke.  Patient will be admitted to the hospitalist who admit for TIA work-up, stroke rule out with MRI tomorrow when it is available.  Her symptoms have improved somewhat throughout her visit here. Patient disposition: Admit Patient condition: Good. The patient appears reasonably stabilized for admission considering the current resources, flow, and capabilities available in the ED at this time, and I doubt any other [facility] further screening and/or treatment in the ED prior to admission.   Final Clinical Impressions(s) / ED Diagnoses   Final diagnoses:  TIA (transient ischemic attack)  Left arm numbness    ED Discharge Orders    None       Arthor CaptainHarris, Corneluis Allston, PA-C 07/02/19 2205    Sabas SousBero, Michael M, MD 07/03/19 [phone]

## 2019-07-02 NOTE — H&P (Signed)
TRH H&P    Patient Demographics:    Mical Brun, is a 53 y.o. female  MRN: [id number]  DOB - 10/27/1965  Admit Date - 07/02/2019  Referring MD/NP/PA: PA Abigail  Outpatient Primary MD for the patient is Jacquelin Hawking, PA-C  Patient coming from: Home  Chief complaint- Chest pain   HPI:    Maelyn Berrey  is a 53 y.o. female, with hx of HTN, hiatal hernia, and depression, presents to the hospital with chest pain. Patient reports that she has had intermittent, non-exertional chest pain for the last two weeks. Today she was sitting down watching TV the pain got much worse.  She reports that the pain was left-sided went into her axilla, up over her trapezius, and her left arm became numb.  Describes the pain as sharp, and 8 out of 10.  The pain in that location is gone but she still has 8 out of 10 pain at rib 5 the right sternal border.  The numbness is completely resolved. patient reports that in the 2 weeks she was having intermittent chest pain she did not see a physician.  She reports that she does not have health insurance, and this is why she is not been to a primary care physician.  She also reports that she is supposed to be on medication for high blood pressure and has not been on it.  She does feel that the left arm numbness and the chest pain are directly related.  She reports associated nausea, diaphoresis, and shortness of breath.  She denies palpitations, and dizziness.  Patient does report that she has had a horrible headache today, and for the last 2 months.  The headache is a pressure-like pain that wraps all the way around her head.  Patient reports that she has never had chest pain like this before.  Patient has never had a stroke before.  Patient has no heart history and does not see a cardiologist.  Neither of patient's parents nor her children have had heart attacks.  Her father did have CHF.  Patient  smokes a pack of cigarettes per day.  After discussion, she is willing to try to quit.  In the ED CT head was done that shows mild chronic ischemic changes without acute abnormality.  CTA was done to rule out dissection it shows no acute thoracic abdominal or pelvic pathology.  No PE.  Air-fluid levels present: Likely loose stool.  Chest x-ray is done that showed no active disease.  EKG was done that shows normal sinus rhythm with a rate of 67 a QTC of 442 and no ST elevation.  Lab work was done that shows a tiny leukocytosis at 10.5.  Troponins 8 and 9.  Normal renal function with a creatinine of 0.75.  Slight hyponatremia at 133.  Admission requested for TIA work-up and chest pain rule out.  Initial blood pressure in the ED was 193/85, improved to 159/87 without intervention.    Review of systems:    In addition to the HPI above,  No Fever-chills,  Positive for tension headache, No changes with Vision or hearing, No problems swallowing food or Liquids, Positive for chest pain, Cough and shortness of Breath, No Abdominal pain, endorses nausea, bowel movements are regular, No Blood in stool or Urine, No dysuria, No new skin rashes or bruises, No new joints pains-aches,  No new weakness, tingling No recent weight gain or loss, No polyuria, polydypsia or polyphagia, Significant financial mental stressors  All other systems reviewed and are negative.    Past History of the following :    Past Medical History:  Diagnosis Date   Chronic back pain    Chronic pain in left foot    Cleft foot L foot   Degenerative joint disease    Depression 2003   Hernia, hiatal    Hiatal hernia    Hypertension    PUD (peptic ulcer disease)    ? ETOH and ASA use   Sciatica       Past Surgical History:  Procedure Laterality Date   CESAREAN SECTION     x2   CHOLECYSTECTOMY  1993   CLUB FOOT RELEASE     left foot   ESOPHAGOGASTRODUODENOSCOPY  10/06/2011   ulcers,multiple in antrum  and duodenitis/hiatal hernia   ESOPHAGOGASTRODUODENOSCOPY  12/15/2011   SLF: Moderate gastritis/ Hiatal hernia/PUD IMPROVED BUT NOT RESOLVED DUE TO ONGOING ASA USE   TUBAL LIGATION  1999      Social History:      Social History   Tobacco Use   Smoking status: Current Every Day Smoker    Packs/day: 0.25    Years: 36.00    Pack years: 9.00    Types: Cigarettes   Smokeless tobacco: Never Used  Substance Use Topics   Alcohol use: Yes    Alcohol/week: 67.0 standard drinks    Types: 67 Cans of beer per week    Comment: 40 oz beer every other day       Family History :     Family History  Problem Relation Age of Onset   Hypertension Mother    Heart failure Father        CHF   Alcohol abuse Father    Cancer Father        Stomach Cancer   Colon cancer Neg Hx    Family history reviewed   Home Medications:   Prior to Admission medications   Medication Sig Start Date End Date Taking? Authorizing Provider  Aspirin-Acetaminophen-Caffeine (GOODY HEADACHE PO) Take 1 packet by mouth 2 (two) times daily as needed (for headache).   Yes [provider]     Allergies:     Allergies  Allergen Reactions   Penicillins Rash     Physical Exam:   Vitals  Blood pressure (!) 173/85, pulse 81, temperature (!) 97.5 F (36.4 C), temperature source Oral, resp. rate (!) 22, height  (1.499 m), weight 61.2 kg, SpO2 96 %.  1.  General: Lying supine in bed in no acute distress  2. Psychiatric: Mood and affect appropriate for situation  3. Neurologic: Cranial nerves II through XII are intact, heel-to-shin is intact, reflexes are normal, sensation is equal between left and right upper extremities, 5 out of 5 strength in all 4 extremities.  4. HEENMT:  Head is atraumatic normocephalic pupils are reactive to light  5. Respiratory : Wheezing present in the lower lung fields, no rhonchi or rales  6. Cardiovascular : Heart rate and rhythm are regular no  murmur, current right-sided chest pain is  reproducible with palpation to rib 5  7. Gastrointestinal:  Abdomen is soft nondistended nontender to palpation  8. Skin:  No acute lesions on limited skin exam  9.Musculoskeletal:  Rib 5 is tender to palpation at side    Data Review:    CBC Recent Labs  Lab 07/02/19 1705  WBC 10.5  HGB 15.1*  HCT 44.3  PLT 241  MCV 100.5*  MCH 34.2*  MCHC 34.1  RDW 12.0   ------------------------------------------------------------------------------------------------------------------  Results for orders placed or performed during the hospital encounter of 07/02/19 (from the past 48 hour(s))  CBC     Status: Abnormal   Collection Time: 07/02/19  5:05 PM  Result Value Ref Range   WBC 10.5 4.0 - 10.5 K/uL   RBC 4.41 3.87 - 5.11 MIL/uL   Hemoglobin 15.1 (H) 12.0 - 15.0 g/dL   HCT 44.3 36.0 - 46.0 %   MCV 100.5 (H) 80.0 - 100.0 fL   MCH 34.2 (H) 26.0 - 34.0 pg   MCHC 34.1 30.0 - 36.0 g/dL   RDW 12.0 11.5 - 15.5 %   Platelets 241 150 - 400 K/uL   nRBC 0.0 0.0 - 0.2 %    Comment: Performed at [facility], [address], [place], [place] [address]  Troponin I (High Sensitivity)     Status: None   Collection Time: 07/02/19  5:05 PM  Result Value Ref Range   Troponin I (High Sensitivity) 8 <18 ng/L    Comment: (NOTE) Elevated high sensitivity troponin I (hsTnI) values and significant  changes across serial measurements may suggest ACS but many other  chronic and acute conditions are known to elevate hsTnI results.  Refer to the "Links" section for chest pain algorithms and additional  guidance. Performed at [facility], [address] [place], [place] [address]   Comprehensive metabolic panel     Status: Abnormal   Collection Time: 07/02/19  5:05 PM  Result Value Ref Range   Sodium 133 (L) 135 - 145 mmol/L   Potassium 3.6 3.5 - 5.1 mmol/L   Chloride 102 98 - 111 mmol/L   CO2 21 (L) 22 - 32 mmol/L   Glucose, Bld 110 (H) 70 - 99 mg/dL    BUN 9 6 - 20 mg/dL   Creatinine, Ser 0.75 0.44 - 1.00 mg/dL   Calcium 8.1 (L) 8.9 - 10.3 mg/dL   Total Protein 7.0 6.5 - 8.1 g/dL   Albumin 3.9 3.5 - 5.0 g/dL   AST 21 15 - 41 U/L   ALT 15 0 - 44 U/L   Alkaline Phosphatase 70 38 - 126 U/L   Total Bilirubin 1.0 0.3 - 1.2 mg/dL   GFR calc non Af Amer >60 >60 mL/min   GFR calc Af Amer >60 >60 mL/min   Anion gap 10 5 - 15    Comment: Performed at [facility], [address] [place], [place] [address]  Troponin I (High Sensitivity)     Status: None   Collection Time: 07/02/19  7:44 PM  Result Value Ref Range   Troponin I (High Sensitivity) 9 <18 ng/L    Comment: (NOTE) Elevated high sensitivity troponin I (hsTnI) values and significant  changes across serial measurements may suggest ACS but many other  chronic and acute conditions are known to elevate hsTnI results.  Refer to the "Links" section for chest pain algorithms and additional  guidance. Performed at [facility], [address], [place], [place] [address]     [place]  Recent Labs  Lab 07/02/19 1705  NA 133*  K 3.6  CL 102  CO2 21*  GLUCOSE 110*  BUN 9  CREATININE 0.75  CALCIUM 8.1*  AST 21  ALT 15  ALKPHOS 70  BILITOT 1.0   ------------------------------------------------------------------------------------------------------------------  ------------------------------------------------------------------------------------------------------------------ GFR: Estimated Creatinine Clearance: 66.2 mL/min (by C-G formula based on SCr of 0.75 mg/dL). Liver Function Tests: Recent Labs  Lab 07/02/19 1705  AST 21  ALT 15  ALKPHOS 70  BILITOT 1.0  PROT 7.0  ALBUMIN 3.9   No results for input(s): LIPASE, AMYLASE in the last 168 hours. No results for input(s): AMMONIA in the last 168 hours. Coagulation Profile: No results for input(s): INR, PROTIME in the last 168 hours. Cardiac Enzymes: No results for input(s): CKTOTAL, CKMB, CKMBINDEX, TROPONINI in the  last 168 hours. BNP (last 3 results) No results for input(s): PROBNP in the last 8760 hours. HbA1C: No results for input(s): HGBA1C in the last 72 hours. CBG: No results for input(s): GLUCAP in the last 168 hours. Lipid Profile: No results for input(s): CHOL, HDL, LDLCALC, TRIG, CHOLHDL, LDLDIRECT in the last 72 hours. Thyroid Function Tests: No results for input(s): TSH, T4TOTAL, FREET4, T3FREE, THYROIDAB in the last 72 hours. Anemia Panel: No results for input(s): VITAMINB12, FOLATE, FERRITIN, TIBC, IRON, RETICCTPCT in the last 72 hours.  --------------------------------------------------------------------------------------------------------------- Urine analysis:    Component Value Date/Time   COLORURINE YELLOW 09/20/2012 1526   APPEARANCEUR CLEAR 09/20/2012 1526   LABSPEC 1.026 09/20/2012 1526   PHURINE 5.5 09/20/2012 1526   GLUCOSEU NEG 09/20/2012 1526   HGBUR NEG 09/20/2012 1526   BILIRUBINUR NEG 09/20/2012 1526   KETONESUR NEG 09/20/2012 1526   PROTEINUR NEG 09/20/2012 1526   UROBILINOGEN 0.2 09/20/2012 1526   NITRITE NEG 09/20/2012 1526   LEUKOCYTESUR NEG 09/20/2012 1526      Imaging Results:    Ct Head Wo Contrast  Result Date: 07/02/2019 CLINICAL DATA:  Left-sided chest pain and left arm numbness EXAM: CT HEAD WITHOUT CONTRAST TECHNIQUE: Contiguous axial images were obtained from the base of the skull through the vertex without intravenous contrast. COMPARISON:  None. FINDINGS: Brain: No evidence of acute infarction, hemorrhage, hydrocephalus, extra-axial collection or mass lesion/mass effect. A few scattered small lacunar infarcts are noted within the deep white matter bilaterally. These appear chronic. Vascular: No hyperdense vessel or unexpected calcification. Skull: Normal. Negative for fracture or focal lesion. Sinuses/Orbits: No acute finding. Other: None. IMPRESSION: Mild chronic ischemic changes without acute abnormality. Electronically Signed   By: Alcide Clever M.D.   On: 07/02/2019 19:14   Dg Chest Port 1 View  Result Date: 07/02/2019 CLINICAL DATA:  Chest pain EXAM: PORTABLE CHEST 1 VIEW COMPARISON:  03/04/2015 chest radiograph. FINDINGS: Stable cardiomediastinal silhouette with normal heart size. No pneumothorax. No pleural effusion. Lungs appear clear, with no acute consolidative airspace disease and no pulmonary edema. Cholecystectomy clips are seen in the right upper quadrant of the abdomen. IMPRESSION: No active disease. Electronically Signed   By: Delbert Phenix M.D.   On: 07/02/2019 17:26   Ct Angio Chest/abd/pel For Dissection W And/or Wo Contrast  Result Date: 07/02/2019 CLINICAL DATA:  Left-sided chest pain x2 weeks now with numbness and tingling. EXAM: CT ANGIOGRAPHY CHEST, ABDOMEN AND PELVIS TECHNIQUE: Multidetector CT imaging through the chest, abdomen and pelvis was performed using the standard protocol during bolus administration of intravenous contrast. Multiplanar reconstructed images and MIPs were obtained and reviewed to evaluate the vascular anatomy. CONTRAST:  OMNIPAQUE IOHEXOL 350 MG/ML  SOLN COMPARISON:  CT abdomen pelvis dated 09/29/2012. CT chest dated September 08, 2012. FINDINGS: CTA CHEST FINDINGS Cardiovascular: There is no evidence for an aortic dissection. There is no thoracic aortic aneurysm. The main pulmonary artery is not significantly dilated. There is no large centrally located pulmonary embolism. The heart size is normal. There is no significant pericardial effusion. Mediastinum/Nodes: --No mediastinal or hilar lymphadenopathy. --No axillary lymphadenopathy. --No supraclavicular lymphadenopathy. --Normal thyroid gland. --The esophagus is unremarkable Lungs/Pleura: No pulmonary nodules or masses. There is a small opacity measuring approximately 7 mm in the medial left lower lobe favored to represent atelectasis. No pleural effusion or pneumothorax. No focal airspace consolidation. No focal pleural abnormality.  Musculoskeletal: No chest wall abnormality. No acute or significant osseous findings. Review of the MIP images confirms the above findings. CTA ABDOMEN AND PELVIS FINDINGS VASCULAR Aorta: Normal caliber aorta without aneurysm, dissection, vasculitis or significant stenosis. Celiac: Patent without evidence of aneurysm, dissection, vasculitis or significant stenosis. SMA: Patent without evidence of aneurysm, dissection, vasculitis or significant stenosis. Renals: Both renal arteries are patent without evidence of aneurysm, dissection, vasculitis, fibromuscular dysplasia or significant stenosis. IMA: Patent without evidence of aneurysm, dissection, vasculitis or significant stenosis. Inflow: Patent without evidence of aneurysm, dissection, vasculitis or significant stenosis. Veins: No obvious venous abnormality within the limitations of this arterial phase study. Review of the MIP images confirms the above findings. NON-VASCULAR Hepatobiliary: The liver is normal. Status post cholecystectomy.There is no biliary ductal dilation. Pancreas: Normal contours without ductal dilatation. No peripancreatic fluid collection. Spleen: No splenic laceration or hematoma. Adrenals/Urinary Tract: --Adrenal glands: No adrenal hemorrhage. --Right kidney/ureter: No hydronephrosis or perinephric hematoma. --Left kidney/ureter: No hydronephrosis or perinephric hematoma. --Urinary bladder: Unremarkable. Stomach/Bowel: --Stomach/Duodenum: No hiatal hernia or other gastric abnormality. Normal duodenal course and caliber. --Small bowel: No dilatation or inflammation. --Colon: There are air-fluid levels in the colon consistent with liquid stool. --Appendix: Normal. Lymphatic: --No retroperitoneal lymphadenopathy. --No mesenteric lymphadenopathy. --No pelvic or inguinal lymphadenopathy. Reproductive: Unremarkable Other: No ascites or free air. The abdominal wall is normal. Musculoskeletal. No acute displaced fractures. Review of the MIP images  confirms the above findings. IMPRESSION: 1. No acute thoracic, abdominal or pelvic pathology. Specifically, no evidence for aortic dissection. 2. No large centrally located pulmonary embolism. 3. There are air-fluid levels in the colon consistent with liquid stool. Electronically Signed   By: Katherine Mantle M.D.   On: 07/02/2019 19:12       Assessment & Plan:    Active Problems:   TIA (transient ischemic attack)   1. Hypertensive emergency 1. With chest pain, headache, numbness-TIA 2. Resolved without intervention 3. Start lisinopril 10 mg tonight 4. Low-sodium diet 5. Monitor vitals per protocol 6. Trend troponin, initial 8-second was 9 trend 1 more time 7. CT head did not reveal a bleed 8. CTA does not reveal dissection 9. Continue to monitor 2. TIA 1. Monitor on telemetry overnight 2. Aspirin given in ED,  3. Dr. Jerrell Belfast consulted by ED provider -possibly thalamic stroke? 4. MRI brain in the a.m. 5. Echo in the a.m. 6. Ultrasound carotids in the a.m. 7. Symptoms resolved, every 4 neurochecks to assess for recurrence 8. Continue to monitor 3. Hyponatremia 1. Mild hyponatremia that is asymptomatic 2. Continue NaCl infusion 3. Recheck morning 4. Tobacco abuse disorder 1. Patient interested in quitting 2. NicoDerm while in the hospital 5. Likely COPD 1. Wheezing on exam 2. Is not been to PCP to diagnose COPD but this is likely the case 3. Patient  also has smoker's cough 4. Start DuoNeb every 6 hours    DVT Prophylaxis-   Heparin- SCDs   AM Labs Ordered, also please review Full Orders    Code Status:  Full  Admission status: Observation: Based on patients clinical presentation and evaluation of above clinical data, I have made determination that patient meets observation criteria at this time.  Time spent in minutes : 65   Lilyan GilfordAsia B Zierle-Ghosh M.D on 07/02/2019 at 11:38 PM

## 2019-07-03 ENCOUNTER — Observation Stay (HOSPITAL_COMMUNITY): Payer: No Typology Code available for payment source

## 2019-07-03 ENCOUNTER — Observation Stay (HOSPITAL_BASED_OUTPATIENT_CLINIC_OR_DEPARTMENT_OTHER): Payer: No Typology Code available for payment source

## 2019-07-03 DIAGNOSIS — G459 Transient cerebral ischemic attack, unspecified: Secondary | ICD-10-CM

## 2019-07-03 DIAGNOSIS — I169 Hypertensive crisis, unspecified: Secondary | ICD-10-CM | POA: Diagnosis present

## 2019-07-03 LAB — LIPID PANEL
Cholesterol: 150 mg/dL (ref 0–200)
HDL: 55 mg/dL (ref >40)
LDL Cholesterol: 75 mg/dL (ref 0–99)
Total CHOL/HDL Ratio: 2.7 RATIO
Triglycerides: 100 mg/dL (ref <150)
VLDL: 20 mg/dL (ref 0–40)

## 2019-07-03 LAB — ECHOCARDIOGRAM COMPLETE
Height: 59 in
Weight: 2141.11 oz

## 2019-07-03 LAB — HEMOGLOBIN A1C
Hgb A1c MFr Bld: 5.4 % (ref 4.8–5.6)
Mean Plasma Glucose: 108.28 mg/dL

## 2019-07-03 LAB — TROPONIN I (HIGH SENSITIVITY)
Troponin I (High Sensitivity): 9 ng/L (ref <18)
Troponin I (High Sensitivity): 9 ng/L (ref <18)

## 2019-07-03 LAB — SARS CORONAVIRUS 2 (TAT 6-24 HRS): SARS Coronavirus 2: NEGATIVE

## 2019-07-03 LAB — HIV ANTIBODY (ROUTINE TESTING W REFLEX): HIV Screen 4th Generation wRfx: NONREACTIVE

## 2019-07-03 LAB — TSH: TSH: 2.095 u[IU]/mL (ref 0.350–4.500)

## 2019-07-03 MED ORDER — HYDROCHLOROTHIAZIDE 12.5 MG PO CAPS
12.5000 mg | ORAL_CAPSULE | Freq: Every day | ORAL | Status: DC
  Administered 2019-07-03 – 2019-07-04 (×2): 12.5 mg via ORAL
  Filled 2019-07-03 (×2): qty 1

## 2019-07-03 MED ORDER — LABETALOL HCL 5 MG/ML IV SOLN
10.0000 mg | INTRAVENOUS | Status: DC | PRN
  Administered 2019-07-04: 02:00:00 10 mg via INTRAVENOUS
  Filled 2019-07-03: qty 4

## 2019-07-03 MED ORDER — LISINOPRIL 10 MG PO TABS
20.0000 mg | ORAL_TABLET | Freq: Every day | ORAL | Status: DC
  Administered 2019-07-03 – 2019-07-04 (×2): 20 mg via ORAL
  Filled 2019-07-03 (×2): qty 2

## 2019-07-03 NOTE — Progress Notes (Signed)
PROGRESS NOTE    Jasmine SonsJanet M Davies  ZOX:[id number] DOB: 09/15/1965 DOA: 07/02/2019 PCP: Jacquelin HawkingMcElroy, Shannon, PA-C   Brief Narrative:  Per HPI: Jasmine ElliotJanet Davies  is a 53 y.o. female, with hx of HTN, hiatal hernia, and depression, presents to the hospital with chest pain. Patient reports that she has had intermittent, non-exertional chest pain for the last two weeks. Today she was sitting down watching TV the pain got much worse.  She reports that the pain was left-sided went into her axilla, up over her trapezius, and her left arm became numb.  Describes the pain as sharp, and 8 out of 10.  The pain in that location is gone but she still has 8 out of 10 pain at rib 5 the right sternal border.  The numbness is completely resolved. patient reports that in the 2 weeks she was having intermittent chest pain she did not see a physician.  She reports that she does not have health insurance, and this is why she is not been to a primary care physician.  She also reports that she is supposed to be on medication for high blood pressure and has not been on it.  She does feel that the left arm numbness and the chest pain are directly related.  She reports associated nausea, diaphoresis, and shortness of breath.  She denies palpitations, and dizziness.  Patient does report that she has had a horrible headache today, and for the last 2 months.  The headache is a pressure-like pain that wraps all the way around her head.  Patient reports that she has never had chest pain like this before.  Patient has never had a stroke before.  Patient has no heart history and does not see a cardiologist.  Neither of patient's parents nor her children have had heart attacks.  Her father did have CHF.  Patient smokes a pack of cigarettes per day.  After discussion, she is willing to try to quit.  11/23: Patient was admitted with hypertensive crisis and was started on lisinopril 10 mg and continues to have blood pressure elevation.  No acute findings  noted on imaging studies to include brain MRI with no CVA.  2D echocardiogram with LVEF 60-65% with no hypertrophy or diastolic dysfunction noted.  She used to be on lisinopril HCTZ approximately 2 years ago and had stopped taking this medication as she could not afford it.  She just started going back to the free clinic recently.  She continues to have some ongoing chest pain intermittently.  We will plan to restart her prior dose of lisinopril/HCTZ and monitor blood pressures with anticipated discharge by tomorrow if improved.  No issues with physical therapy noted.  Assessment & Plan:   Active Problems:   TIA (transient ischemic attack)   Hypertensive crisis -Secondary to medication noncompliance -Symptomatic chest pain noted, but now this is intermittent and improving -No significant findings on 2D echocardiogram with LVEF 60-65% with no hypertrophy or diastolic dysfunction or valvular disorder -Brain MRI with no acute findings -Plan to resume prior home dose of lisinopril 20 mg as well as HCTZ 12.5 mg and reassess blood pressure control  Hyponatremia-mild -Continue monitoring in a.m. especially with restart of HCTZ -We will need close follow-up outpatient  Tobacco abuse disorder -Counseled on cessation  Alcohol abuse -Consult on cessation -No signs of DTs currently noted  Probable COPD -No acute exacerbation noted -DuoNebs every 6 hours as needed   DVT prophylaxis: Heparin Code Status: Full Family Communication: None  at bedside Disposition Plan: Plan for better blood pressure control to help resolve symptoms and anticipate discharge in a.m. if improved.   Consultants:   None  Procedures:   None  Antimicrobials:   None   Subjective: Patient seen and evaluated today with complaints of some intermittent chest pain without radiation to her arm noted.  She states that she is feeling somewhat better, however her blood pressures remain elevated.  Objective: Vitals:    07/03/19 0428 07/03/19 0523 07/03/19 0609 07/03/19 1000  BP: (!) 162/91  (!) 190/88 (!) 190/80  Pulse: 60  60 61  Resp: 15     Temp:   98.3 F (36.8 C) 98.1 F (36.7 C)  TempSrc:   Oral Oral  SpO2: 96%  95% 100%  Weight:  60.7 kg    Height:  4\' 11"  (1.499 m)      Intake/Output Summary (Last 24 hours) at 07/03/2019 1322 Last data filed at 07/03/2019 0859 Gross per 24 hour  Intake 794.17 ml  Output --  Net 794.17 ml   Filed Weights   07/02/19 1652 07/03/19 0523  Weight: 61.2 kg 60.7 kg    Examination:  General exam: Appears calm and comfortable  Respiratory system: Clear to auscultation. Respiratory effort normal. Cardiovascular system: S1 & S2 heard, RRR. No JVD, murmurs, rubs, gallops or clicks. No pedal edema. Gastrointestinal system: Abdomen is nondistended, soft and nontender. No organomegaly or masses felt. Normal bowel sounds heard. Central nervous system: Alert and oriented. No focal neurological deficits. Extremities: Symmetric 5 x 5 power. Skin: No rashes, lesions or ulcers Psychiatry: Judgement and insight appear normal. Mood & affect appropriate.     Data Reviewed: I have personally reviewed following labs and imaging studies  CBC: Recent Labs  Lab 07/02/19 1705  WBC 10.5  HGB 15.1*  HCT 44.3  MCV 100.5*  PLT 241   Basic Metabolic Panel: Recent Labs  Lab 07/02/19 1705  NA 133*  K 3.6  CL 102  CO2 21*  GLUCOSE 110*  BUN 9  CREATININE 0.75  CALCIUM 8.1*   GFR: Estimated Creatinine Clearance: 65.9 mL/min (by C-G formula based on SCr of 0.75 mg/dL). Liver Function Tests: Recent Labs  Lab 07/02/19 1705  AST 21  ALT 15  ALKPHOS 70  BILITOT 1.0  PROT 7.0  ALBUMIN 3.9   No results for input(s): LIPASE, AMYLASE in the last 168 hours. No results for input(s): AMMONIA in the last 168 hours. Coagulation Profile: No results for input(s): INR, PROTIME in the last 168 hours. Cardiac Enzymes: No results for input(s): CKTOTAL, CKMB,  CKMBINDEX, TROPONINI in the last 168 hours. BNP (last 3 results) No results for input(s): PROBNP in the last 8760 hours. HbA1C: Recent Labs    07/03/19 0506  HGBA1C 5.4   CBG: No results for input(s): GLUCAP in the last 168 hours. Lipid Profile: Recent Labs    07/03/19 0506  CHOL 150  HDL 55  LDLCALC 75  TRIG 100  CHOLHDL 2.7   Thyroid Function Tests: Recent Labs    07/03/19 0506  TSH 2.095   Anemia Panel: No results for input(s): VITAMINB12, FOLATE, FERRITIN, TIBC, IRON, RETICCTPCT in the last 72 hours. Sepsis Labs: No results for input(s): PROCALCITON, LATICACIDVEN in the last 168 hours.  No results found for this or any previous visit (from the past 240 hour(s)).       Radiology Studies: Ct Head Wo Contrast  Result Date: 07/02/2019 CLINICAL DATA:  Left-sided chest pain and left arm  numbness EXAM: CT HEAD WITHOUT CONTRAST TECHNIQUE: Contiguous axial images were obtained from the base of the skull through the vertex without intravenous contrast. COMPARISON:  None. FINDINGS: Brain: No evidence of acute infarction, hemorrhage, hydrocephalus, extra-axial collection or mass lesion/mass effect. A few scattered small lacunar infarcts are noted within the deep white matter bilaterally. These appear chronic. Vascular: No hyperdense vessel or unexpected calcification. Skull: Normal. Negative for fracture or focal lesion. Sinuses/Orbits: No acute finding. Other: None. IMPRESSION: Mild chronic ischemic changes without acute abnormality. Electronically Signed   By: Inez Catalina M.D.   On: 07/02/2019 19:14   Mr Brain Wo Contrast  Result Date: 07/03/2019 CLINICAL DATA:  Left arm numbness EXAM: MRI HEAD WITHOUT CONTRAST TECHNIQUE: Multiplanar, multiecho pulse sequences of the brain and surrounding structures were obtained without intravenous contrast. COMPARISON:  None. FINDINGS: Motion artifact is present. Brain: There is no acute infarction or intracranial hemorrhage. There is no  intracranial mass, mass effect, or edema. There is no hydrocephalus or extra-axial fluid collection. Vascular: Major vessel flow voids at the skull base are preserved. Skull and upper cervical spine: Normal marrow signal is preserved. Sinuses/Orbits: Paranasal sinus mucosal thickening is present. Orbits are unremarkable. Other: Sella is partially empty.  Mastoid air cells are clear. IMPRESSION: Motion degraded study demonstrates no evidence of recent infarction, hemorrhage, or mass. Electronically Signed   By: Macy Mis M.D.   On: 07/03/2019 10:09   US Carotid Bilateral (at [facility] Only)  Result Date: 07/03/2019 CLINICAL DATA:  TIA EXAM: BILATERAL CAROTID DUPLEX ULTRASOUND TECHNIQUE: Pearline Cables scale imaging, color Doppler and duplex ultrasound were performed of bilateral carotid and vertebral arteries in the neck. COMPARISON:  None. FINDINGS: Criteria: Quantification of carotid stenosis is based on velocity parameters that correlate the residual internal carotid diameter with NASCET-based stenosis levels, using the diameter of the distal internal carotid lumen as the denominator for stenosis measurement. The following velocity measurements were obtained: RIGHT ICA: 113/35 cm/sec CCA: 48/18 cm/sec SYSTOLIC ICA/CCA RATIO:  1.6 ECA: 149 cm/sec LEFT ICA: 89/29 cm/sec CCA: 56/31 cm/sec SYSTOLIC ICA/CCA RATIO:  1.0 ECA: 72 cm/sec RIGHT CAROTID ARTERY: Smooth plaque in common carotid artery and bulb. No high-grade stenosis. Normal waveforms and color Doppler signal. RIGHT VERTEBRAL ARTERY: Normal flow direction and waveform, limited visualization. LEFT CAROTID ARTERY: Hypoechoic plaque at the carotid bifurcation resulting in mild stenosis at the ICA origin. Normal waveforms and color Doppler signal throughout. LEFT VERTEBRAL ARTERY:  Normal flow direction and waveform. IMPRESSION: 1. Bilateral carotid bifurcation plaque resulting in less than 50% diameter ICA stenosis. 2. Antegrade bilateral vertebral arterial  flow. Electronically Signed   By: Lucrezia Europe M.D.   On: 07/03/2019 12:35   Dg Chest Port 1 View  Result Date: 07/02/2019 CLINICAL DATA:  Chest pain EXAM: PORTABLE CHEST 1 VIEW COMPARISON:  03/04/2015 chest radiograph. FINDINGS: Stable cardiomediastinal silhouette with normal heart size. No pneumothorax. No pleural effusion. Lungs appear clear, with no acute consolidative airspace disease and no pulmonary edema. Cholecystectomy clips are seen in the right upper quadrant of the abdomen. IMPRESSION: No active disease. Electronically Signed   By: Ilona Sorrel M.D.   On: 07/02/2019 17:26   Ct Angio Chest/abd/pel For Dissection W And/or Wo Contrast  Result Date: 07/02/2019 CLINICAL DATA:  Left-sided chest pain x2 weeks now with numbness and tingling. EXAM: CT ANGIOGRAPHY CHEST, ABDOMEN AND PELVIS TECHNIQUE: Multidetector CT imaging through the chest, abdomen and pelvis was performed using the standard protocol during bolus administration of intravenous contrast. Multiplanar  reconstructed images and MIPs were obtained and reviewed to evaluate the vascular anatomy. CONTRAST:  OMNIPAQUE IOHEXOL 350 MG/ML SOLN COMPARISON:  CT abdomen pelvis dated 09/29/2012. CT chest dated September 08, 2012. FINDINGS: CTA CHEST FINDINGS Cardiovascular: There is no evidence for an aortic dissection. There is no thoracic aortic aneurysm. The main pulmonary artery is not significantly dilated. There is no large centrally located pulmonary embolism. The heart size is normal. There is no significant pericardial effusion. Mediastinum/Nodes: --No mediastinal or hilar lymphadenopathy. --No axillary lymphadenopathy. --No supraclavicular lymphadenopathy. --Normal thyroid gland. --The esophagus is unremarkable Lungs/Pleura: No pulmonary nodules or masses. There is a small opacity measuring approximately 7 mm in the medial left lower lobe favored to represent atelectasis. No pleural effusion or pneumothorax. No focal airspace  consolidation. No focal pleural abnormality. Musculoskeletal: No chest wall abnormality. No acute or significant osseous findings. Review of the MIP images confirms the above findings. CTA ABDOMEN AND PELVIS FINDINGS VASCULAR Aorta: Normal caliber aorta without aneurysm, dissection, vasculitis or significant stenosis. Celiac: Patent without evidence of aneurysm, dissection, vasculitis or significant stenosis. SMA: Patent without evidence of aneurysm, dissection, vasculitis or significant stenosis. Renals: Both renal arteries are patent without evidence of aneurysm, dissection, vasculitis, fibromuscular dysplasia or significant stenosis. IMA: Patent without evidence of aneurysm, dissection, vasculitis or significant stenosis. Inflow: Patent without evidence of aneurysm, dissection, vasculitis or significant stenosis. Veins: No obvious venous abnormality within the limitations of this arterial phase study. Review of the MIP images confirms the above findings. NON-VASCULAR Hepatobiliary: The liver is normal. Status post cholecystectomy.There is no biliary ductal dilation. Pancreas: Normal contours without ductal dilatation. No peripancreatic fluid collection. Spleen: No splenic laceration or hematoma. Adrenals/Urinary Tract: --Adrenal glands: No adrenal hemorrhage. --Right kidney/ureter: No hydronephrosis or perinephric hematoma. --Left kidney/ureter: No hydronephrosis or perinephric hematoma. --Urinary bladder: Unremarkable. Stomach/Bowel: --Stomach/Duodenum: No hiatal hernia or other gastric abnormality. Normal duodenal course and caliber. --Small bowel: No dilatation or inflammation. --Colon: There are air-fluid levels in the colon consistent with liquid stool. --Appendix: Normal. Lymphatic: --No retroperitoneal lymphadenopathy. --No mesenteric lymphadenopathy. --No pelvic or inguinal lymphadenopathy. Reproductive: Unremarkable Other: No ascites or free air. The abdominal wall is normal. Musculoskeletal. No acute  displaced fractures. Review of the MIP images confirms the above findings. IMPRESSION: 1. No acute thoracic, abdominal or pelvic pathology. Specifically, no evidence for aortic dissection. 2. No large centrally located pulmonary embolism. 3. There are air-fluid levels in the colon consistent with liquid stool. Electronically Signed   By: Katherine Mantle M.D.   On: 07/02/2019 19:12        Scheduled Meds:  heparin  5,000 Units Subcutaneous Q8H   hydrochlorothiazide  12.5 mg Oral Daily   lisinopril  20 mg Oral Daily   nicotine  14 mg Transdermal Daily   Continuous Infusions:   LOS: 0 days    Time spent: 30 minutes    Zalen Sequeira Hoover Brunette, DO [facility] Pager [phone]  If 7PM-7AM, please contact night-coverage [email or web address] Password [email or web address] 07/03/2019, 1:22 PM

## 2019-07-03 NOTE — Progress Notes (Signed)
Has denied any numbness in left arm today and denied any other signs , symptoms of stroke.

## 2019-07-03 NOTE — Evaluation (Signed)
Physical Therapy Evaluation Patient Details Name: Jasmine Davies MRN: [id number] DOB: [birth year] Today's Date: 07/03/2019   History of Present Illness  Jasmine Davies  is a 53 y.o. female, with hx of HTN, hiatal hernia, and depression, presents to the hospital with chest pain. Patient reports that she has had intermittent, non-exertional chest pain for the last two weeks. Today she was sitting down watching TV the pain got much worse.  She reports that the pain was left-sided went into her axilla, up over her trapezius, and her left arm became numb.  Describes the pain as sharp, and 8 out of 10.  The pain in that location is gone but she still has 8 out of 10 pain at rib 5 the right sternal border.  The numbness is completely resolved. patient reports that in the 2 weeks she was having intermittent chest pain she did not see a physician.  She reports that she does not have health insurance, and this is why she is not been to a primary care physician.  She also reports that she is supposed to be on medication for high blood pressure and has not been on it.  She does feel that the left arm numbness and the chest pain are directly related.  She reports associated nausea, diaphoresis, and shortness of breath.  She denies palpitations, and dizziness.  Patient does report that she has had a horrible headache today, and for the last 2 months.  The headache is a pressure-like pain that wraps all the way around her head.  Patient reports that she has never had chest pain like this before.  Patient has never had a stroke before.  Patient has no heart history and does not see a cardiologist.  Neither of patient's parents nor her children have had heart attacks.  Her father did have CHF.  Patient smokes a pack of cigarettes per day.  After discussion, she is willing to try to quit  Clinical Impression  PT able to get in and out of bed, don shoes and ambulate 160 ft + I.  Pt is not in need of skilled PT at this time.      Follow Up Recommendations No PT follow up    Equipment Recommendations    none    Recommendations for Other Services   none    Precautions / Restrictions Precautions Precautions: None Restrictions Weight Bearing Restrictions: No      Mobility  Bed Mobility Overal bed mobility: Independent                Transfers Overall transfer level: Independent Equipment used: None                Ambulation/Gait Ambulation/Gait assistance: Independent Gait Distance (Feet): 160 Feet Assistive device: None Gait Pattern/deviations: WFL(Within Functional Limits)   Gait velocity interpretation: >4.37 ft/sec, indicative of normal walking speed            Pertinent Vitals/Pain Pain Assessment: No/denies pain    Home Living Family/patient expects to be discharged to:: Private residence Living Arrangements: Spouse/significant other Available Help at Discharge: Family Type of Home: House Home Access: Stairs to enter Entrance Stairs-Rails: Right Entrance Stairs-Number of Steps: 3 Home Layout: One level        Prior Function     I                Extremity/Trunk Assessment        Lower Extremity Assessment Lower Extremity Assessment: Overall WFL for  tasks assessed       Communication    I  Cognition Arousal/Alertness: Awake/alert Behavior During Therapy: WFL for tasks assessed/performed Overall Cognitive Status: Within Functional Limits for tasks assessed                                               Assessment/Plan    PT Assessment Patent does not need any further PT services         PT Goals (Current goals can be found in the Care Plan section)   no goals needed        Barriers to discharge    none       AM-PAC PT "6 Clicks" Mobility  Outcome Measure Help needed turning from your back to your side while in a flat bed without using bedrails?: None Help needed moving from lying on your back to sitting on the  side of a flat bed without using bedrails?: None Help needed moving to and from a bed to a chair (including a wheelchair)?: None Help needed standing up from a chair using your arms (e.g., wheelchair or bedside chair)?: None Help needed to walk in hospital room?: None Help needed climbing 3-5 steps with a railing? : None 6 Click Score: 24    End of Session Equipment Utilized During Treatment: Gait belt Activity Tolerance: Patient tolerated treatment well Patient left: in chair Nurse Communication: Mobility status      Time: 4097-3532 PT Time Calculation (min) (ACUTE ONLY): 19 min   Charges:  eval              Rayetta Humphrey, PT CLT [phone] 07/03/2019, 11:54 AM

## 2019-07-03 NOTE — TOC Initial Note (Addendum)
Transition of Care [facility]) - Initial/Assessment Note    Patient Details  Name: Jasmine Davies MRN: [id number] Date of Birth: [birth year]  [facility] CM/SW Contact:    Dylann Gallier, Chauncey Reading, RN Phone Number: 07/03/2019, 11:27 AM  Clinical Narrative:       CM consulted for potential homeless issues. Patient admitted for CP/TIA workup. She has been living with friends for little over a year. She reports she was living with her dad, he died in 2008/12/20, and her brother took the house away from her. She has two grown boys, but she does not have a relationship with them. She does not have any insurance, reports she applied for medicaid about 4 years ago but was only eligible for family planning medicaid. She  has worked as a Educational psychologist for about 2 years, worked at [employer] in 2008-12-20 for about 8 months and worked for [employer] service in 12-21-15 for about 6 months.    She reports she has last been to a PCP about 2 years ago at the [facility]. She reports she will be able to get transportation to MD appointments.    She is aware that only option CM can provide is a Presenter, broadcasting. She is agreeable to list. She plans to most likely go back to her friends house. Friend is here visiting her now.   Referral made to [facility] for ongoing housing resources.   PT/OT/SLP evals are pending. Patient would be charity care for services if they were needed but those would not be an option at a homeless shelter. She reports she does not use any DME prior to admission.     ADDENDUM: Patient has been cleared by PT, no needs. Will make f/u appt at [facility].   ADDENDUM: F/u appt scheduled with [facility].  Expected Discharge Plan: Home/Self Care Barriers to Discharge: Continued Medical Work up     Expected Discharge Plan and Services Expected Discharge Plan: Home/Self Care   Discharge Planning Services: CM Consult   Living arrangements for the past 2 months: Single Family Home                          Prior Living Arrangements/Services Living arrangements for the past 2 months: Single Family Home Lives with:: Friends                   Activities of Daily Living Home Assistive Devices/Equipment: None ADL Screening (condition at time of admission) Patient's cognitive ability adequate to safely complete daily activities?: Yes Is the patient deaf or have difficulty hearing?: No Does the patient have difficulty seeing, even when wearing glasses/contacts?: No Does the patient have difficulty concentrating, remembering, or making decisions?: No Patient able to express need for assistance with ADLs?: Yes Does the patient have difficulty dressing or bathing?: No Independently performs ADLs?: Yes (appropriate for developmental age) Does the patient have difficulty walking or climbing stairs?: Yes Weakness of Legs: None Weakness of Arms/Hands: Left   Emotional Assessment     Affect (typically observed): Appropriate Orientation: : Oriented to Self, Oriented to Place, Oriented to  Time      Admission diagnosis:  TIA (transient ischemic attack) [G45.9] Left arm numbness [R20.0] Patient Active Problem List   Diagnosis Date Noted  . TIA (transient ischemic attack) 07/02/2019  . Left foot pain 12/23/2015  . Cigarette nicotine dependence without complication 99/37/1696  . Essential hypertension, benign 12/23/2015  . History of  alcohol abuse 12/23/2015  . Abdominal pain, other specified site 09/20/2012  . Nausea with vomiting 09/20/2012  . Abdominal bloating 12/09/2011  . PUD (peptic ulcer disease) 12/09/2011  . Epigastric pain 10/01/2011  . Hematemesis 10/01/2011  . Abnormal CT scan, chest 10/01/2011  . Abnormal CT scan 10/01/2011   PCP:  Jacquelin Hawking, PA-C Pharmacy:   [facility] - [place], [place] - [address] [place] [address] Phone: [phone] Fax: [phone]     Social Determinants of Health (SDOH)  Interventions    Readmission Risk Interventions No flowsheet data found.

## 2019-07-03 NOTE — Progress Notes (Signed)
OT Cancellation Note  Patient Details Name: Jasmine Davies MRN: [id number] DOB: 23-Mar-1966   Cancelled Treatment:    Reason Eval/Treat Not Completed: Patient at procedure or test/ unavailable.  Will re-attempt OT evaluation when able.    Ailene Ravel, OTR/L,CBIS  [phone]  07/03/2019, 9:04 AM

## 2019-07-03 NOTE — Progress Notes (Signed)
  Echocardiogram 2D Echocardiogram has been performed.  Jasmine Davies 07/03/2019, 9:34 AM

## 2019-07-03 NOTE — Progress Notes (Signed)
SLP Cancellation Note  Patient Details Name: [name] MRN: [id number] DOB: [birth year]   Cancelled treatment:       Reason Eval/Treat Not Completed: SLP screened, no needs identified, will sign off. SLP screened Pt in room. Pt denies any changes in swallowing, speech, language, or cognition. MRI negative for acute changes. SLE will be deferred at this time. Reconsult if indicated. SLP will sign off.  Thank you,  Genene Churn, [place]    [place] 07/03/2019, 3:51 PM

## 2019-07-04 ENCOUNTER — Inpatient Hospital Stay (HOSPITAL_COMMUNITY): Payer: No Typology Code available for payment source

## 2019-07-04 DIAGNOSIS — I169 Hypertensive crisis, unspecified: Secondary | ICD-10-CM

## 2019-07-04 LAB — BASIC METABOLIC PANEL
Anion gap: 9 (ref 5–15)
BUN: 12 mg/dL (ref 6–20)
CO2: 28 mmol/L (ref 22–32)
Calcium: 8.7 mg/dL — ABNORMAL LOW (ref 8.9–10.3)
Chloride: 105 mmol/L (ref 98–111)
Creatinine, Ser: 0.84 mg/dL (ref 0.44–1.00)
GFR calc Af Amer: >60 mL/min (ref >60)
GFR calc non Af Amer: >60 mL/min (ref >60)
Glucose, Bld: 104 mg/dL — ABNORMAL HIGH (ref 70–99)
Potassium: 4.4 mmol/L (ref 3.5–5.1)
Sodium: 142 mmol/L (ref 135–145)

## 2019-07-04 LAB — CBC
HCT: 42 % (ref 36.0–46.0)
Hemoglobin: 13.8 g/dL (ref 12.0–15.0)
MCH: 34.3 pg — ABNORMAL HIGH (ref 26.0–34.0)
MCHC: 32.9 g/dL (ref 30.0–36.0)
MCV: 104.5 fL — ABNORMAL HIGH (ref 80.0–100.0)
Platelets: 206 10*3/uL (ref 150–400)
RBC: 4.02 MIL/uL (ref 3.87–5.11)
RDW: 12.4 % (ref 11.5–15.5)
WBC: 7.5 10*3/uL (ref 4.0–10.5)
nRBC: 0 % (ref 0.0–0.2)

## 2019-07-04 LAB — PROCALCITONIN: Procalcitonin: <0.1 ng/mL

## 2019-07-04 MED ORDER — LISINOPRIL 20 MG PO TABS: 20.0000 mg | ORAL_TABLET | Freq: Every day | ORAL | 3 refills | Status: DC

## 2019-07-04 MED ORDER — GUAIFENESIN ER 600 MG PO TB12: 600.0000 mg | ORAL_TABLET | Freq: Two times a day (BID) | ORAL | 0 refills | Status: AC

## 2019-07-04 MED ORDER — HYDROCHLOROTHIAZIDE 12.5 MG PO CAPS: 12.5000 mg | ORAL_CAPSULE | Freq: Every day | ORAL | 3 refills | Status: DC

## 2019-07-04 NOTE — Progress Notes (Signed)
Stroke education discussed with patient and handout given. Continues to deny left arm numbness or any other deficits.  IV removed and discharge instructions reviewed with scripts sent to pharmacy.  Significant  other to drive home

## 2019-07-04 NOTE — Progress Notes (Signed)
OT Cancellation Note  Patient Details Name: Jasmine Davies MRN: [id number] DOB: [birth year]   Cancelled Treatment:    Reason Eval/Treat Not Completed: OT screened, no needs identified, will sign off. Pt screened for OT needs. Pt is completing ADLs independently, MRI negative for acute infarct. No further OT needs at this time.    Guadelupe Sabin, OTR/L  [phone] 07/04/2019, 7:23 AM

## 2019-07-04 NOTE — Discharge Summary (Signed)
Physician Discharge Summary  KAYLISE BLAKELEY FUX:[id number] DOB: 10/09/1965 DOA: 07/02/2019  PCP: No primary care provider on file.  Admit date: 07/02/2019  Discharge date: 07/04/2019  Admitted From:Home  Disposition:  Home  Recommendations for Outpatient Follow-up:  1. Follow up with PCP in 1-2 weeks 2. Follow-up blood pressures in free clinic 3. Lisinopril and HCTZ prescribed for blood pressure management.  Encouraged strict compliance. 4. Continue to take over-the-counter Tylenol as needed for headaches 5. Mucinex for cough and congestion with no signs of pneumonia or active bacterial infection noted  Home Health: None  Equipment/Devices: None  Discharge Condition: Stable  CODE STATUS: Full  Diet recommendation: Heart Healthy  Brief/Interim Summary: Per HPI: Jasmine Davies a53 y.o.female,with hx of HTN, hiatal hernia, and depression, presents to the hospital with chest pain. Patient reports that she has had intermittent, non-exertional chest pain for the last two weeks. Todayshe was sitting down watching TV the pain got much worse. She reports that the pain was left-sided went into her axilla, up over her trapezius, and her left arm became numb. Describes the pain as sharp, and 8 out of 10. The pain in that location is gone but she still has 8 out of 10 pain at rib 5 the right sternal border.The numbness is completely resolved.patient reports that in the 2 weeks she was having intermittent chest pain she did not see a physician. She reports that she does not have health insurance, and this is why she is not been to a primary care physician. She also reports that she is supposed to be on medication for high blood pressure and has not been on it. She does feel that the left arm numbness and the chest pain are directly related. She reports associated nausea, diaphoresis, and shortness of breath. She denies palpitations, and dizziness. Patient does report that she has had a  horrible headache today, and for the last 2 months. The headache is a pressure-like pain that wraps all the way around her head. Patient reports that she has never had chest pain like this before. Patient has never had a stroke before. Patient has no heart history and does not see a cardiologist. Neither of patient's parents nor her children have had heart attacks. Her father did have CHF. Patient smokes a pack of cigarettes per day. After discussion, she is willing to try to quit.  11/23: Patient was admitted with hypertensive crisis and was started on lisinopril 10 mg and continues to have blood pressure elevation.  No acute findings noted on imaging studies to include brain MRI with no CVA.  2D echocardiogram with LVEF 60-65% with no hypertrophy or diastolic dysfunction noted.  She used to be on lisinopril HCTZ approximately 2 years ago and had stopped taking this medication as she could not afford it.  She just started going back to the free clinic recently.  She continues to have some ongoing chest pain intermittently.  We will plan to restart her prior dose of lisinopril/HCTZ and monitor blood pressures with anticipated discharge by tomorrow if improved.  No issues with physical therapy noted.  11/24: Patient noted to be stable for discharge today with significant improvement in blood pressure readings on lisinopril and HCTZ.  These are the same medications and dosages which were prescribed to her 2 years ago, however patient has not been taking due to cost issues.  She states that she will remain compliant on these medications and has no further chest pain or arm numbness noted.  She  does have a mild headache this morning and has been given some Tylenol.  She was also noted to have some cough and congestion and chest x-ray as well as CBC and procalcitonin were ordered with no acute findings.  I have given her some Mucinex as needed.  She is stable for discharge today.  Counseled on cessation of  tobacco and alcohol use which would contribute to elevated blood pressure readings.  Discharge Diagnoses:  Active Problems:   TIA (transient ischemic attack)   Hypertensive crisis  Principal discharge diagnosis: Hypertensive crisis secondary to medication noncompliance.  Discharge Instructions  Discharge Instructions    Diet - low sodium heart healthy   Complete by: As directed    Increase activity slowly   Complete by: As directed      Allergies as of 07/04/2019      Reactions   Penicillins Rash      Medication List    STOP taking these medications   GOODY HEADACHE PO     TAKE these medications   guaiFENesin 600 MG 12 hr tablet Commonly known as: Mucinex Take 1 tablet (600 mg total) by mouth 2 (two) times daily for 10 days.   hydrochlorothiazide 12.5 MG capsule Commonly known as: MICROZIDE Take 1 capsule (12.5 mg total) by mouth daily. Start taking on: July 05, 2019   lisinopril 20 MG tablet Commonly known as: ZESTRIL Take 1 tablet (20 mg total) by mouth daily. Start taking on: July 05, 2019      Follow-up Information    [facility] Follow up on 07/11/2019.   Why: Please follow up with [facility] on Tuesday, December 1st at 2:00pm.  Contact information: [address] [place], [place] [address] Phone: [phone] Fax: [phone]         Allergies  Allergen Reactions  . Penicillins Rash    Consultations:  None   Procedures/Studies: Ct Head Wo Contrast  Result Date: 07/02/2019 CLINICAL DATA:  Left-sided chest pain and left arm numbness EXAM: CT HEAD WITHOUT CONTRAST TECHNIQUE: Contiguous axial images were obtained from the base of the skull through the vertex without intravenous contrast. COMPARISON:  None. FINDINGS: Brain: No evidence of acute infarction, hemorrhage, hydrocephalus, extra-axial collection or mass lesion/mass effect. A few scattered small lacunar infarcts are noted within the deep white matter  bilaterally. These appear chronic. Vascular: No hyperdense vessel or unexpected calcification. Skull: Normal. Negative for fracture or focal lesion. Sinuses/Orbits: No acute finding. Other: None. IMPRESSION: Mild chronic ischemic changes without acute abnormality. Electronically Signed   By: Alcide Clever M.D.   On: 07/02/2019 19:14   Mr Brain Wo Contrast  Result Date: 07/03/2019 CLINICAL DATA:  Left arm numbness EXAM: MRI HEAD WITHOUT CONTRAST TECHNIQUE: Multiplanar, multiecho pulse sequences of the brain and surrounding structures were obtained without intravenous contrast. COMPARISON:  None. FINDINGS: Motion artifact is present. Brain: There is no acute infarction or intracranial hemorrhage. There is no intracranial mass, mass effect, or edema. There is no hydrocephalus or extra-axial fluid collection. Vascular: Major vessel flow voids at the skull base are preserved. Skull and upper cervical spine: Normal marrow signal is preserved. Sinuses/Orbits: Paranasal sinus mucosal thickening is present. Orbits are unremarkable. Other: Sella is partially empty.  Mastoid air cells are clear. IMPRESSION: Motion degraded study demonstrates no evidence of recent infarction, hemorrhage, or mass. Electronically Signed   By: Guadlupe Spanish M.D.   On: 07/03/2019 10:09   US Carotid Bilateral (at [facility] Only)  Result Date: 07/03/2019 CLINICAL  DATA:  TIA EXAM: BILATERAL CAROTID DUPLEX ULTRASOUND TECHNIQUE: Wallace CullensGray scale imaging, color Doppler and duplex ultrasound were performed of bilateral carotid and vertebral arteries in the neck. COMPARISON:  None. FINDINGS: Criteria: Quantification of carotid stenosis is based on velocity parameters that correlate the residual internal carotid diameter with NASCET-based stenosis levels, using the diameter of the distal internal carotid lumen as the denominator for stenosis measurement. The following velocity measurements were obtained: RIGHT ICA: 113/35 cm/sec CCA: 73/14 cm/sec  SYSTOLIC ICA/CCA RATIO:  1.6 ECA: 149 cm/sec LEFT ICA: 89/29 cm/sec CCA: 85/18 cm/sec SYSTOLIC ICA/CCA RATIO:  1.0 ECA: 72 cm/sec RIGHT CAROTID ARTERY: Smooth plaque in common carotid artery and bulb. No high-grade stenosis. Normal waveforms and color Doppler signal. RIGHT VERTEBRAL ARTERY: Normal flow direction and waveform, limited visualization. LEFT CAROTID ARTERY: Hypoechoic plaque at the carotid bifurcation resulting in mild stenosis at the ICA origin. Normal waveforms and color Doppler signal throughout. LEFT VERTEBRAL ARTERY:  Normal flow direction and waveform. IMPRESSION: 1. Bilateral carotid bifurcation plaque resulting in less than 50% diameter ICA stenosis. 2. Antegrade bilateral vertebral arterial flow. Electronically Signed   By: Corlis Leak  Hassell M.D.   On: 07/03/2019 12:35   Dg Chest Port 1 View  Result Date: 07/04/2019 CLINICAL DATA:  Shortness of breath, cough EXAM: PORTABLE CHEST 1 VIEW COMPARISON:  07/02/2019 FINDINGS: The heart size and mediastinal contours are within normal limits. Both lungs are clear. The visualized skeletal structures are unremarkable. IMPRESSION: No acute abnormality of the lungs in AP portable projection. Electronically Signed   By: Lauralyn PrimesAlex  Bibbey M.D.   On: 07/04/2019 09:03   Dg Chest Port 1 View  Result Date: 07/02/2019 CLINICAL DATA:  Chest pain EXAM: PORTABLE CHEST 1 VIEW COMPARISON:  03/04/2015 chest radiograph. FINDINGS: Stable cardiomediastinal silhouette with normal heart size. No pneumothorax. No pleural effusion. Lungs appear clear, with no acute consolidative airspace disease and no pulmonary edema. Cholecystectomy clips are seen in the right upper quadrant of the abdomen. IMPRESSION: No active disease. Electronically Signed   By: Delbert PhenixJason A Poff M.D.   On: 07/02/2019 17:26   Ct Angio Chest/abd/pel For Dissection W And/or Wo Contrast  Result Date: 07/02/2019 CLINICAL DATA:  Left-sided chest pain x2 weeks now with numbness and tingling. EXAM: CT ANGIOGRAPHY  CHEST, ABDOMEN AND PELVIS TECHNIQUE: Multidetector CT imaging through the chest, abdomen and pelvis was performed using the standard protocol during bolus administration of intravenous contrast. Multiplanar reconstructed images and MIPs were obtained and reviewed to evaluate the vascular anatomy. CONTRAST:  100mL OMNIPAQUE IOHEXOL 350 MG/ML SOLN COMPARISON:  CT abdomen pelvis dated 09/29/2012. CT chest dated September 08, 2012. FINDINGS: CTA CHEST FINDINGS Cardiovascular: There is no evidence for an aortic dissection. There is no thoracic aortic aneurysm. The main pulmonary artery is not significantly dilated. There is no large centrally located pulmonary embolism. The heart size is normal. There is no significant pericardial effusion. Mediastinum/Nodes: --No mediastinal or hilar lymphadenopathy. --No axillary lymphadenopathy. --No supraclavicular lymphadenopathy. --Normal thyroid gland. --The esophagus is unremarkable Lungs/Pleura: No pulmonary nodules or masses. There is a small opacity measuring approximately 7 mm in the medial left lower lobe favored to represent atelectasis. No pleural effusion or pneumothorax. No focal airspace consolidation. No focal pleural abnormality. Musculoskeletal: No chest wall abnormality. No acute or significant osseous findings. Review of the MIP images confirms the above findings. CTA ABDOMEN AND PELVIS FINDINGS VASCULAR Aorta: Normal caliber aorta without aneurysm, dissection, vasculitis or significant stenosis. Celiac: Patent without evidence of aneurysm, dissection, vasculitis or significant stenosis.  SMA: Patent without evidence of aneurysm, dissection, vasculitis or significant stenosis. Renals: Both renal arteries are patent without evidence of aneurysm, dissection, vasculitis, fibromuscular dysplasia or significant stenosis. IMA: Patent without evidence of aneurysm, dissection, vasculitis or significant stenosis. Inflow: Patent without evidence of aneurysm, dissection,  vasculitis or significant stenosis. Veins: No obvious venous abnormality within the limitations of this arterial phase study. Review of the MIP images confirms the above findings. NON-VASCULAR Hepatobiliary: The liver is normal. Status post cholecystectomy.There is no biliary ductal dilation. Pancreas: Normal contours without ductal dilatation. No peripancreatic fluid collection. Spleen: No splenic laceration or hematoma. Adrenals/Urinary Tract: --Adrenal glands: No adrenal hemorrhage. --Right kidney/ureter: No hydronephrosis or perinephric hematoma. --Left kidney/ureter: No hydronephrosis or perinephric hematoma. --Urinary bladder: Unremarkable. Stomach/Bowel: --Stomach/Duodenum: No hiatal hernia or other gastric abnormality. Normal duodenal course and caliber. --Small bowel: No dilatation or inflammation. --Colon: There are air-fluid levels in the colon consistent with liquid stool. --Appendix: Normal. Lymphatic: --No retroperitoneal lymphadenopathy. --No mesenteric lymphadenopathy. --No pelvic or inguinal lymphadenopathy. Reproductive: Unremarkable Other: No ascites or free air. The abdominal wall is normal. Musculoskeletal. No acute displaced fractures. Review of the MIP images confirms the above findings. IMPRESSION: 1. No acute thoracic, abdominal or pelvic pathology. Specifically, no evidence for aortic dissection. 2. No large centrally located pulmonary embolism. 3. There are air-fluid levels in the colon consistent with liquid stool. Electronically Signed   By: Katherine Mantle M.D.   On: 07/02/2019 19:12     Discharge Exam: Vitals:   07/04/19 0600 07/04/19 1034  BP: (!) 152/74 136/77  Pulse: 66 74  Resp: 18 16  Temp: 98.3 F (36.8 C) 98.1 F (36.7 C)  SpO2: 99% 98%   Vitals:   07/03/19 2206 07/04/19 0200 07/04/19 0600 07/04/19 1034  BP: (!) 165/84 (!) 192/97 (!) 152/74 136/77  Pulse: (!) 57 64 66 74  Resp: Temp: 98.3 F (36.8 C) 98.5 F (36.9 C) 98.3 F (36.8 C) 98.1  F (36.7 C)  TempSrc: Oral Oral Oral Oral  SpO2: 97% 96% 99% 98%  Weight:      Height:        General: Pt is alert, awake, not in acute distress Cardiovascular: RRR, S1/S2 +, no rubs, no gallops Respiratory: CTA bilaterally, no wheezing, no rhonchi Abdominal: Soft, NT, ND, bowel sounds + Extremities: no edema, no cyanosis    The results of significant diagnostics from this hospitalization (including imaging, microbiology, ancillary and laboratory) are listed below for reference.     Microbiology: Recent Results (from the past 240 hour(s))  SARS CORONAVIRUS 2 (TAT 6-24 HRS) Nasopharyngeal Nasopharyngeal Swab     Status: None   Collection Time: 07/02/19 11:12 PM   Specimen: Nasopharyngeal Swab  Result Value Ref Range Status   SARS Coronavirus 2 NEGATIVE NEGATIVE Final    Comment: (NOTE) SARS-CoV-2 target nucleic acids are NOT DETECTED. The SARS-CoV-2 RNA is generally detectable in upper and lower respiratory specimens during the acute phase of infection. Negative results do not preclude SARS-CoV-2 infection, do not rule out co-infections with other pathogens, and should not be used as the sole basis for treatment or other patient management decisions. Negative results must be combined with clinical observations, patient history, and epidemiological information. The expected result is Negative. Fact Sheet for Patients: HairSlick.no Fact Sheet for Healthcare Providers: quierodirigir.com This test is not yet approved or cleared by the [place] FDA and  has been authorized for detection and/or diagnosis of SARS-CoV-2 by FDA under an Emergency  Use Authorization (EUA). This EUA will remain  in effect (meaning this test can be used) for the duration of the COVID-19 declaration under Section 56 4(b)(1) of the Act, 21 U.S.C. section 360bbb-3(b)(1), unless the authorization is terminated or revoked sooner. Performed at  [facility], [address] [place], [place] [address]      Labs: BNP (last 3 results) No results for input(s): BNP in the last 8760 hours. Basic Metabolic Panel: Recent Labs  Lab 07/02/19 1705 07/04/19 0505  NA 133* 142  K 3.6 4.4  CL 102 105  CO2 21* 28  GLUCOSE 110* 104*  BUN 9 12  CREATININE 0.75 0.84  CALCIUM 8.1* 8.7*   Liver Function Tests: Recent Labs  Lab 07/02/19 1705  AST 21  ALT 15  ALKPHOS 70  BILITOT 1.0  PROT 7.0  ALBUMIN 3.9   No results for input(s): LIPASE, AMYLASE in the last 168 hours. No results for input(s): AMMONIA in the last 168 hours. CBC: Recent Labs  Lab 07/02/19 1705 07/04/19 0505  WBC 10.5 7.5  HGB 15.1* 13.8  HCT 44.3 42.0  MCV 100.5* 104.5*  PLT 241 206   Cardiac Enzymes: No results for input(s): CKTOTAL, CKMB, CKMBINDEX, TROPONINI in the last 168 hours. BNP: Invalid input(s): POCBNP CBG: No results for input(s): GLUCAP in the last 168 hours. D-Dimer No results for input(s): DDIMER in the last 72 hours. Hgb A1c Recent Labs    07/03/19 0506  HGBA1C 5.4   Lipid Profile Recent Labs    07/03/19 0506  CHOL 150  HDL 55  LDLCALC 75  TRIG 100  CHOLHDL 2.7   Thyroid function studies Recent Labs    07/03/19 0506  TSH 2.095   Anemia work up No results for input(s): VITAMINB12, FOLATE, FERRITIN, TIBC, IRON, RETICCTPCT in the last 72 hours. Urinalysis    Component Value Date/Time   COLORURINE YELLOW 09/20/2012 1526   APPEARANCEUR CLEAR 09/20/2012 1526   LABSPEC 1.026 09/20/2012 1526   PHURINE 5.5 09/20/2012 1526   GLUCOSEU NEG 09/20/2012 1526   HGBUR NEG 09/20/2012 1526   BILIRUBINUR NEG 09/20/2012 1526   KETONESUR NEG 09/20/2012 1526   PROTEINUR NEG 09/20/2012 1526   UROBILINOGEN 0.2 09/20/2012 1526   NITRITE NEG 09/20/2012 1526   LEUKOCYTESUR NEG 09/20/2012 1526   Sepsis Labs Invalid input(s): PROCALCITONIN,  WBC,  LACTICIDVEN Microbiology Recent Results (from the past 240 hour(s))  SARS  CORONAVIRUS 2 (TAT 6-24 HRS) Nasopharyngeal Nasopharyngeal Swab     Status: None   Collection Time: 07/02/19 11:12 PM   Specimen: Nasopharyngeal Swab  Result Value Ref Range Status   SARS Coronavirus 2 NEGATIVE NEGATIVE Final    Comment: (NOTE) SARS-CoV-2 target nucleic acids are NOT DETECTED. The SARS-CoV-2 RNA is generally detectable in upper and lower respiratory specimens during the acute phase of infection. Negative results do not preclude SARS-CoV-2 infection, do not rule out co-infections with other pathogens, and should not be used as the sole basis for treatment or other patient management decisions. Negative results must be combined with clinical observations, patient history, and epidemiological information. The expected result is Negative. Fact Sheet for Patients: HairSlick.no Fact Sheet for Healthcare Providers: quierodirigir.com This test is not yet approved or cleared by the [place] FDA and  has been authorized for detection and/or diagnosis of SARS-CoV-2 by FDA under an Emergency Use Authorization (EUA). This EUA will remain  in effect (meaning this test can be used) for the duration of the COVID-19 declaration under  Section 56 4(b)(1) of the Act, 21 U.S.C. section 360bbb-3(b)(1), unless the authorization is terminated or revoked sooner. Performed at [facility], [address], [place], [place] [address]      Time coordinating discharge: 35 minutes  SIGNED:   Erick Blinks, DO [facility] 07/04/2019, 11:23 AM  If 7PM-7AM, please contact night-coverage [email or web address]

## 2019-07-25 ENCOUNTER — Other Ambulatory Visit (HOSPITAL_COMMUNITY): Payer: Self-pay | Admitting: Physician Assistant

## 2019-07-25 ENCOUNTER — Encounter: Payer: Self-pay | Admitting: Physician Assistant

## 2019-07-25 ENCOUNTER — Ambulatory Visit: Payer: No Typology Code available for payment source | Admitting: Physician Assistant

## 2019-07-25 ENCOUNTER — Other Ambulatory Visit: Payer: Self-pay

## 2019-07-25 DIAGNOSIS — R059 Cough, unspecified: Secondary | ICD-10-CM

## 2019-07-25 DIAGNOSIS — N63 Unspecified lump in unspecified breast: Secondary | ICD-10-CM

## 2019-07-25 DIAGNOSIS — F1011 Alcohol abuse, in remission: Secondary | ICD-10-CM

## 2019-07-25 DIAGNOSIS — R05 Cough: Secondary | ICD-10-CM

## 2019-07-25 DIAGNOSIS — Z7689 Persons encountering health services in other specified circumstances: Secondary | ICD-10-CM

## 2019-07-25 DIAGNOSIS — N6310 Unspecified lump in the right breast, unspecified quadrant: Secondary | ICD-10-CM

## 2019-07-25 DIAGNOSIS — J3489 Other specified disorders of nose and nasal sinuses: Secondary | ICD-10-CM

## 2019-07-25 DIAGNOSIS — Z1211 Encounter for screening for malignant neoplasm of colon: Secondary | ICD-10-CM

## 2019-07-25 DIAGNOSIS — I10 Essential (primary) hypertension: Secondary | ICD-10-CM

## 2019-07-25 MED ORDER — HYDROCHLOROTHIAZIDE 12.5 MG PO CAPS: 12.5000 mg | ORAL_CAPSULE | Freq: Every day | ORAL | 3 refills | Status: DC

## 2019-07-25 MED ORDER — LISINOPRIL 20 MG PO TABS: 20.0000 mg | ORAL_TABLET | Freq: Every day | ORAL | 3 refills | Status: DC

## 2019-07-25 NOTE — Progress Notes (Signed)
There were no vitals taken for this visit.   Subjective:    Patient ID: Jasmine Davies, female    DOB: 09-15-1965, 53 y.o.   MRN: [id number]  HPI: Jasmine Davies is a 53 y.o. female presenting on 07/25/2019 for No chief complaint on file.   HPI  Patient appointment today to establish care as a returning patient.  Patient was scheduled for an in the office appointment today but due to complaints of runny nose her appointment was changed to a virtual visit.  Patient appointment is through [employer] due to coronavirus pandemic.  I connected with  Jasmine Davies on 07/25/19 by a video enabled telemedicine application and verified that I am speaking with the correct person using two identifiers.   I discussed the limitations of evaluation and management by telemedicine. The patient expressed understanding and agreed to proceed.  Patient is in her parked car in the parking lot.  Provider is in office.  Patient is a 53 year old female who was previously [facility] by the [facility] clinic and was last seen by our facility in January 2019.Marland Kitchen Patient was admitted to the hospital in November with hypertension and TIA.  Patient with history of alcoholism but she says she has not drank anything in 3 weeks since leaving the hospital.  Patient complains of having a runny nose which started yesterday.  She does not particularly think that it is related to allergies.  She says she does not know why she has a runny nose.  Patient denies cough but during visit it was noted that patient does have coughing episodes.  Patient denies fever.  Patient has no known exposures to COVID-19.  She does not have a bp machine at home.  Patient says she found R breast lump during BSE- states "a couple of weeks ago"   Patient says that overall she is feeling pretty well.    Relevant past medical, surgical, family and social history reviewed and updated as indicated. Interim medical history since our last visit reviewed. Allergies and  medications reviewed and updated.   Current Outpatient Medications:  .  guaiFENesin (MUCINEX) 600 MG 12 hr tablet, Take by mouth 2 (two) times daily., Disp: , Rfl:  .  hydrochlorothiazide (MICROZIDE) 12.5 MG capsule, Take 1 capsule (12.5 mg total) by mouth daily., Disp: 30 capsule, Rfl: 3 .  lisinopril (ZESTRIL) 20 MG tablet, Take 1 tablet (20 mg total) by mouth daily., Disp: 30 tablet, Rfl: 3   Review of Systems  Per HPI unless specifically indicated above     Objective:    There were no vitals taken for this visit.  Wt Readings from Last 3 Encounters:  07/03/19 133 lb 13.1 oz (60.7 kg)  10/26/18 135 lb (61.2 kg)  08/17/17 152 lb (68.9 kg)    Physical Exam Constitutional:      General: She is not in acute distress.    Appearance: She is not toxic-appearing.  HENT:     Head: Normocephalic and atraumatic.  Pulmonary:     Effort: Pulmonary effort is normal. No respiratory distress.  Neurological:     Mental Status: She is alert and oriented to person, place, and time.  Psychiatric:        Attention and Perception: Attention normal.        Speech: Speech normal.        Behavior: Behavior is cooperative.           Assessment & Plan:     1.  Hypertension Patient is to continue her lisinopril and hydrochlorothiazide.  Patient is given an automatic blood pressure machine that she can use to monitor her blood pressure while at home.  Patient will have follow-up telemedicine appointment in 1 month and discussed that she will need to provide [place] with her blood pressure readings at that time.  She states understanding  2.  Right breast lump Will refer for diagnostic mammogram.    3.  Suspected COVID-19 infection/rhinorrhea/cough Patient is encouraged to get tested for COVID-19.  She has given information on how to schedule a test for [facility].  She is to contact office if she has any problems with getting scheduled.  Patient is counseled on self isolation should she gets her  test results.  4.  Tobacco use disorder Encouraged smoking cessation.  Patient was given support information including contact for 1 800 quit now.  5. HCM Patient is given iFOBT for colon cancer screening No labs needed at this time  6. history heavy alcohol use Patient is congratulated on not drinking.  Encouraged her to continue to avoid alcohol.  F/u EV 1 mo as above.  Patient is to contact office sooner for any problems.

## 2019-07-25 NOTE — Patient Instructions (Addendum)
GET COVID 19 TEST:  Scheduling can be done online at [email or web address] or by texting "COVID" to [phone].

## 2019-07-26 ENCOUNTER — Other Ambulatory Visit (HOSPITAL_COMMUNITY): Payer: Self-pay | Admitting: Physician Assistant

## 2019-07-26 DIAGNOSIS — N63 Unspecified lump in unspecified breast: Secondary | ICD-10-CM

## 2019-07-27 ENCOUNTER — Telehealth: Payer: Self-pay | Admitting: Student

## 2019-07-27 NOTE — Telephone Encounter (Signed)
Pt has not gone for COVID testing as she was asked to do at her appt two days ago on Tuesday, 07-25-19.  LPN called and spoke with patient to urge her to get tested as instructed by PA. Pt was given information on how to schedule appt for testing and pt states she will get scheduled today, 07-27-19.

## 2019-07-31 ENCOUNTER — Ambulatory Visit: Payer: HRSA Program | Attending: Internal Medicine

## 2019-07-31 ENCOUNTER — Other Ambulatory Visit: Payer: Self-pay

## 2019-07-31 DIAGNOSIS — Z20828 Contact with and (suspected) exposure to other viral communicable diseases: Secondary | ICD-10-CM | POA: Diagnosis present

## 2019-07-31 DIAGNOSIS — Z20822 Contact with and (suspected) exposure to covid-19: Secondary | ICD-10-CM

## 2019-08-01 LAB — NOVEL CORONAVIRUS, NAA: SARS-CoV-2, NAA: NOT DETECTED

## 2019-08-07 ENCOUNTER — Other Ambulatory Visit: Payer: Self-pay | Admitting: Physician Assistant

## 2019-08-07 DIAGNOSIS — Z1211 Encounter for screening for malignant neoplasm of colon: Secondary | ICD-10-CM

## 2019-08-07 LAB — IFOBT (OCCULT BLOOD): IFOBT: NEGATIVE

## 2019-08-09 ENCOUNTER — Encounter: Payer: Self-pay | Admitting: Physician Assistant

## 2019-08-10 ENCOUNTER — Encounter: Payer: Self-pay | Admitting: Physician Assistant

## 2019-08-15 ENCOUNTER — Ambulatory Visit (HOSPITAL_COMMUNITY)
Admission: RE | Admit: 2019-08-15 | Discharge: 2019-08-15 | Disposition: A | Discharge status: Home / Self Care | Payer: Self-pay | Source: Ambulatory Visit | Attending: Physician Assistant | Admitting: Physician Assistant

## 2019-08-15 ENCOUNTER — Other Ambulatory Visit: Payer: Self-pay

## 2019-08-15 ENCOUNTER — Ambulatory Visit (HOSPITAL_COMMUNITY): Admission: RE | Admit: 2019-08-15 | Payer: Self-pay | Source: Ambulatory Visit

## 2019-08-15 DIAGNOSIS — N63 Unspecified lump in unspecified breast: Secondary | ICD-10-CM

## 2019-08-16 ENCOUNTER — Ambulatory Visit: Payer: No Typology Code available for payment source | Admitting: Physician Assistant

## 2019-08-16 ENCOUNTER — Encounter: Payer: Self-pay | Admitting: Physician Assistant

## 2019-08-16 VITALS — BP 119/87

## 2019-08-16 DIAGNOSIS — F17200 Nicotine dependence, unspecified, uncomplicated: Secondary | ICD-10-CM

## 2019-08-16 DIAGNOSIS — F419 Anxiety disorder, unspecified: Secondary | ICD-10-CM

## 2019-08-16 DIAGNOSIS — I10 Essential (primary) hypertension: Secondary | ICD-10-CM

## 2019-08-16 MED ORDER — CITALOPRAM HYDROBROMIDE 20 MG PO TABS: 20.0000 mg | ORAL_TABLET | Freq: Every day | ORAL | 0 refills | Status: DC

## 2019-08-16 NOTE — Progress Notes (Signed)
BP 119/87    Subjective:    Patient ID: Jasmine Davies, female    DOB: 1965/12/16, 54 y.o.   MRN: [id number]  HPI: Jasmine Davies is a 54 y.o. female presenting on 08/16/2019 for Hypertension (pt is at a [employer] to use wifi. pt did not take her bp monitor with her. pt states she last checked her bp last night it was 119/87. pt c/o of bp being low on 08/11/19 at 7:15pm being 83/56. )   HPI    This is a telemedicine appointment due to coronavirus pandemic.  It is via telephone as patient was unable to get connected through [employer].  I connected with  Jasmine Davies on 08/16/19 by a video enabled telemedicine application and verified that I am speaking with the correct person using two identifiers.   I discussed the limitations of evaluation and management by telemedicine. The patient expressed understanding and agreed to proceed.  Patient is at [facility] (where she was using the Wi-Fi).  Provider is at office.    Patient is a 54 year old female with appointment today to follow-up on blood pressure.  Patient was seen last month to reestablish care and was given prescriptions to resume her blood pressure medications at that time.  Since that time patient sent email through my chart stating that her blood pressure was low.  Patient was instructed to hold her hydrochlorothiazide.  Since that time her blood pressure has improved.  Patient is feeling well today.  Patient had Breast US yesterday which was normal and she was recommended to continue with annual screening mammograms in 1 year.  Pt requests something for agitation.  She says it staryed about a month.  She doesn't know why it started.  She says she just can't do anything right.   She sometimes feels some depression.  No SI, HI.  No instigating event.   Patient says she was on some Lexapro about 12 years ago for having similar symptoms at that time she said      Relevant past medical, surgical, family and social history reviewed and  updated as indicated. Interim medical history since our last visit reviewed. Allergies and medications reviewed and updated.    Current Outpatient Medications:  .  lisinopril (ZESTRIL) 20 MG tablet, Take 1 tablet (20 mg total) by mouth daily., Disp: 30 tablet, Rfl: 3 .  guaiFENesin (MUCINEX) 600 MG 12 hr tablet, Take by mouth 2 (two) times daily., Disp: , Rfl:  .  hydrochlorothiazide (MICROZIDE) 12.5 MG capsule, Take 1 capsule (12.5 mg total) by mouth daily. (Patient not taking: Reported on 08/16/2019), Disp: 30 capsule, Rfl: 3   Review of Systems  Per HPI unless specifically indicated above     Objective:    BP 119/87   Wt Readings from Last 3 Encounters:  07/03/19 133 lb 13.1 oz (60.7 kg)  10/26/18 135 lb (61.2 kg)  08/17/17 152 lb (68.9 kg)    Physical Exam Pulmonary:     Effort: Pulmonary effort is normal. No respiratory distress.  Neurological:     Mental Status: She is alert and oriented to person, place, and time.  Psychiatric:        Attention and Perception: Attention normal.        Mood and Affect: Mood is not depressed.        Speech: Speech normal.        Behavior: Behavior is cooperative.     Results for orders placed or performed in visit  on 08/07/19  IFOBT POC (occult bld, rslt in office)  Result Value Ref Range   IFOBT Negative       Assessment & Plan:    1.  Hypertension Patient will continue her lisinopril.  Patient will not restart her HCTZ as blood pressure is well controlled without it and it went too low while she was taking it.  Patient is to continue to monitor her blood pressure several times a week and notify office for changes  2.  Anxiety/depression rx citalopram.  F/u 3 weeks to reheck mood.  Patient is encouraged to contact office sooner for any worsening or new symptoms.  Patient is in agreement with plan-   3.  Healthcare maintenance Reviewed results of colon cancer screening test with patient

## 2019-09-05 ENCOUNTER — Encounter: Payer: Self-pay | Admitting: Physician Assistant

## 2019-09-05 ENCOUNTER — Ambulatory Visit: Payer: No Typology Code available for payment source | Admitting: Physician Assistant

## 2019-09-05 VITALS — BP 121/81

## 2019-09-05 DIAGNOSIS — F1011 Alcohol abuse, in remission: Secondary | ICD-10-CM

## 2019-09-05 DIAGNOSIS — I10 Essential (primary) hypertension: Secondary | ICD-10-CM

## 2019-09-05 DIAGNOSIS — F419 Anxiety disorder, unspecified: Secondary | ICD-10-CM

## 2019-09-05 DIAGNOSIS — R55 Syncope and collapse: Secondary | ICD-10-CM

## 2019-09-05 DIAGNOSIS — F17200 Nicotine dependence, unspecified, uncomplicated: Secondary | ICD-10-CM

## 2019-09-05 NOTE — Progress Notes (Signed)
   BP 121/81    Subjective:    Patient ID: Jasmine Davies, female    DOB: 1965/08/17, 54 y.o.   MRN: [id number]  HPI: Jasmine Davies is a 54 y.o. female presenting on 09/05/2019 for No chief complaint on file.   HPI  This is a telemedicine appointment due to coronavirus pandemic.  It is via telephone as pt does not have ability to connect with video  I connected with  Jasmine Davies on 09/05/19 by a video enabled telemedicine application and verified that I am speaking with the correct person using two identifiers.   I discussed the limitations of evaluation and management by telemedicine. The patient expressed understanding and agreed to proceed.  Pt is at home.  Provider is at office.    pt is 53yoF with follow up appointment today to recheck depression.  Pt states mood a lot better on celexa.  She is sleeping.  No depression. Anxiety improving.  Pt reports 1 episode of near syncope on 1/21- while watching TV.  She says it lasted 2 or 3 seconds- "everything went black".  She didn't feel anything  coming- it just happened.    She says this is similar to episode she had in November but not as bad.  In November, she was admitted to hospital with TIA.   Pt had ekg echo and carotid US in November during work up for TIA.   All of these were normal.    She continues to smoke but says no etoh- she says she is 2 months sober.   Pt was given automatic BP machine and her BP has been good.      Relevant past medical, surgical, family and social history reviewed and updated as indicated. Interim medical history since our last visit reviewed. Allergies and medications reviewed and updated.   Current Outpatient Medications:  .  citalopram (CELEXA) 20 MG tablet, Take 1 tablet (20 mg total) by mouth daily., Disp: 30 tablet, Rfl: 0 .  lisinopril (ZESTRIL) 20 MG tablet, Take 1 tablet (20 mg total) by mouth daily., Disp: 30 tablet, Rfl: 3    Review of Systems  Per HPI unless specifically indicated  above     Objective:    BP 121/81   Wt Readings from Last 3 Encounters:  07/03/19 133 lb 13.1 oz (60.7 kg)  10/26/18 135 lb (61.2 kg)  08/17/17 152 lb (68.9 kg)    Physical Exam Pulmonary:     Effort: No respiratory distress.  Neurological:     Mental Status: She is alert and oriented to person, place, and time.  Psychiatric:        Attention and Perception: Attention normal.        Speech: Speech normal.        Behavior: Behavior is cooperative.         Assessment & Plan:    Encounter Diagnoses  Name Primary?  Marland Kitchen Anxiety Yes  . Essential hypertension, benign   . Tobacco use disorder   . Alcohol abuse, in remission   . Near syncope      -recommended pt Add low dose aspirin  -encouraged regular Exercise   -congratulated on avoiding etoh  -encouraged smoking cessation  -discussed with pt that she should go to ER if she has syncopal episode for evaluation  -pt to Follow up 6 weeks to recheck mood.  She is to contact office sooner prn.

## 2019-09-14 ENCOUNTER — Other Ambulatory Visit: Payer: Self-pay | Admitting: Physician Assistant

## 2019-10-11 ENCOUNTER — Other Ambulatory Visit: Payer: Self-pay | Admitting: Physician Assistant

## 2019-10-16 ENCOUNTER — Ambulatory Visit: Payer: No Typology Code available for payment source | Admitting: Physician Assistant

## 2019-10-17 ENCOUNTER — Encounter: Payer: Self-pay | Admitting: Physician Assistant

## 2019-10-17 ENCOUNTER — Ambulatory Visit: Payer: No Typology Code available for payment source | Admitting: Physician Assistant

## 2019-10-17 VITALS — BP 144/76

## 2019-10-17 DIAGNOSIS — F1011 Alcohol abuse, in remission: Secondary | ICD-10-CM

## 2019-10-17 DIAGNOSIS — M79672 Pain in left foot: Secondary | ICD-10-CM

## 2019-10-17 DIAGNOSIS — F419 Anxiety disorder, unspecified: Secondary | ICD-10-CM

## 2019-10-17 DIAGNOSIS — I10 Essential (primary) hypertension: Secondary | ICD-10-CM

## 2019-10-17 DIAGNOSIS — F17200 Nicotine dependence, unspecified, uncomplicated: Secondary | ICD-10-CM

## 2019-10-17 NOTE — Progress Notes (Signed)
BP (!) 144/76    Subjective:    Patient ID: Jasmine Davies, female    DOB: 1966/03/22, 54 y.o.   MRN: [id number]  HPI: Jasmine Davies is a 54 y.o. female presenting on 10/17/2019 for No chief complaint on file.   HPI   This is a telemedicine appointment due to coronavirus pandemic.  It is via telephone as pt does not have video enabled device.  I connected with  Jasmine Davies on 10/17/19 by a video enabled telemedicine application and verified that I am speaking with the correct person using two identifiers.   I discussed the limitations of evaluation and management by telemedicine. The patient expressed understanding and agreed to proceed.  Pt is at home.  Provider is at office   Pt is 53yoF with HTN, anxiety and history alcohol abuse.  Pt says mood is doing a lot better on the celexa.  Anxiety is better  No SI, HI.   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   She Says sometimes it swelled.   It is not not red.  Not hot.  Appears bruised.  Denies injury.  This has been a couple of weeks.  She has been using ice and elevation.   Pt doesn't know if she has financial assistance.  She was in hospital in November but doesn't seem to know if she got a bill for that.   She is monitoring her Blood pressure at home.     Relevant past medical, surgical, family and social history reviewed and updated as indicated. Interim medical history since our last visit reviewed. Allergies and medications reviewed and updated.   Current Outpatient Medications:  .  aspirin EC 81 MG tablet, Take 81 mg by mouth daily., Disp: , Rfl:  .  citalopram (CELEXA) 20 MG tablet, TAKE ONE (1) TABLET EACH DAY, Disp: 30 tablet, Rfl: 1 .  lisinopril (ZESTRIL) 20 MG tablet, Take 1 tablet (20 mg total) by mouth daily., Disp: 30 tablet, Rfl: 3     Review of Systems  Per HPI unless specifically indicated  above     Objective:    BP (!) 144/76   Wt Readings from Last 3 Encounters:  07/03/19 133 lb 13.1 oz (60.7 kg)  10/26/18 135 lb (61.2 kg)  08/17/17 152 lb (68.9 kg)    Physical Exam Pulmonary:     Effort: No respiratory distress.  Neurological:     Mental Status: She is alert and oriented to person, place, and time.  Psychiatric:        Attention and Perception: Attention normal.        Speech: Speech normal.        Behavior: Behavior is cooperative.          Assessment & Plan:     Encounter Diagnoses  Name Primary?  . Left foot pain Yes  . Essential hypertension, benign   . Anxiety   . Alcohol abuse, in remission   . Tobacco use disorder       -will get Xray left foot -pt to Continue celexa for mood -pt to Continue lisinopril fob htn -pt needs CAFA/financial assistance- will refer to [facility] as pt seems confused by this subject -pt to follow up in 3 months for bp, mood.  She is to contact office sooner prn worsening or new  symptoms

## 2019-10-19 ENCOUNTER — Ambulatory Visit (HOSPITAL_COMMUNITY)
Admission: RE | Admit: 2019-10-19 | Discharge: 2019-10-19 | Disposition: A | Discharge status: Home / Self Care | Payer: Self-pay | Source: Ambulatory Visit | Attending: Physician Assistant | Admitting: Physician Assistant

## 2019-10-19 ENCOUNTER — Other Ambulatory Visit: Payer: Self-pay

## 2019-10-19 DIAGNOSIS — M79672 Pain in left foot: Secondary | ICD-10-CM | POA: Insufficient documentation

## 2019-11-02 ENCOUNTER — Encounter: Payer: Self-pay | Admitting: Physician Assistant

## 2019-11-02 ENCOUNTER — Ambulatory Visit: Payer: No Typology Code available for payment source | Admitting: Physician Assistant

## 2019-11-02 VITALS — BP 134/70 | HR 69 | Temp 97.9°F | Wt 143.8 lb

## 2019-11-02 DIAGNOSIS — M79672 Pain in left foot: Secondary | ICD-10-CM

## 2019-11-02 DIAGNOSIS — Z9889 Other specified postprocedural states: Secondary | ICD-10-CM

## 2019-11-02 DIAGNOSIS — Q742 Other congenital malformations of lower limb(s), including pelvic girdle: Secondary | ICD-10-CM

## 2019-11-02 MED ORDER — DICLOFENAC SODIUM 75 MG PO TBEC: 75.0000 mg | DELAYED_RELEASE_TABLET | Freq: Two times a day (BID) | ORAL | 1 refills | Status: DC | PRN

## 2019-11-02 NOTE — Progress Notes (Signed)
BP 134/70   Pulse 69   Temp 97.9 F (36.6 C)   Wt 143 lb 12.8 oz (65.2 kg)   SpO2 99%   BMI 29.04 kg/m    Subjective:    Patient ID: Jasmine Davies, female    DOB: [birth year], 54 y.o.   MRN: [id number]  HPI: Jasmine Davies is a 54 y.o. female presenting on 11/02/2019 for Foot Pain   HPI  Pt had negative covid 31 screening [place]   Pt is very pleasant 37yoF who is happy today because she just got to  Months of sobriety.    Her appointment today is because she is still having pain left foot.  She was seen for this (with virtual appointment ) on 10/17/19.   She had xray done which shows arthritis at TMT with no acute findings.  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  She says she has never had problems with this in the past but says it just won't stop hurting for the past 2-3 weeks.  She denies recent injury.  She denies redness.    Relevant past medical, surgical, family and social history reviewed and updated as indicated. Interim medical history since our last visit reviewed. Allergies and medications reviewed and updated.   Current Outpatient Medications:  .  aspirin EC 81 MG tablet, Take 81 mg by mouth daily., Disp: , Rfl:  .  citalopram (CELEXA) 20 MG tablet, TAKE ONE (1) TABLET EACH DAY, Disp: 30 tablet, Rfl: 1 .  lisinopril (ZESTRIL) 20 MG tablet, Take 1 tablet (20 mg total) by mouth daily., Disp: 30 tablet, Rfl: 3    Review of Systems  Per HPI unless specifically indicated above     Objective:    BP 134/70   Pulse 69   Temp 97.9 F (36.6 C)   Wt 143 lb 12.8 oz (65.2 kg)   SpO2 99%   BMI 29.04 kg/m   Wt Readings from Last 3 Encounters:  11/02/19 143 lb 12.8 oz (65.2 kg)  07/03/19 133 lb 13.1 oz (60.7 kg)  10/26/18 135 lb (61.2 kg)    Physical Exam Vitals reviewed.  Constitutional:      General: She is not in acute distress.    Appearance: Normal appearance. She is not ill-appearing.  HENT:     Head: Normocephalic  and atraumatic.  Cardiovascular:     Pulses:          Dorsalis pedis pulses are 2+ on the left side.  Pulmonary:     Effort: Pulmonary effort is normal. No respiratory distress.  Musculoskeletal:     Right lower leg: No edema.     Left lower leg: No edema.     Left foot: Decreased range of motion. Deformity present.       Feet:  Feet:     Left foot:     Skin integrity: Skin integrity normal.     Comments: Tender (see drawing).   Localized mild swelled versus normal variant lateral foot inferior to lateral malleolus.   no bruising.  L foot with some deformity and well healed surgical scars.  Neurological:     Mental Status: She is alert and oriented to person, place, and time.     Gait: Gait abnormal.  Psychiatric:        Attention and Perception: Attention normal.        Mood and Affect: Mood normal.        Speech:  Speech normal.        Behavior: Behavior is cooperative.            Assessment & Plan:      Encounter Diagnoses  Name Primary?  . Left foot pain Yes  . Congenital foot abnormality   . S/P foot surgery, left       -Refer to orthopedics for her foot -She has submitted application for [employer] financial assistance. -Pt to follow up in June as scheduled  She is to contact office sooner prn

## 2019-11-14 ENCOUNTER — Ambulatory Visit (INDEPENDENT_AMBULATORY_CARE_PROVIDER_SITE_OTHER): Payer: Self-pay | Admitting: Orthopaedic Surgery

## 2019-11-14 ENCOUNTER — Other Ambulatory Visit: Payer: Self-pay

## 2019-11-14 ENCOUNTER — Encounter: Payer: Self-pay | Admitting: Orthopaedic Surgery

## 2019-11-14 VITALS — Ht 59.0 in | Wt 143.0 lb

## 2019-11-14 DIAGNOSIS — M79672 Pain in left foot: Secondary | ICD-10-CM

## 2019-11-14 NOTE — Progress Notes (Signed)
Office Visit Note   Patient: Jasmine Davies           Date of Birth: [birth year]           MRN: [id number] Visit Date: 11/14/2019              Requested by: Soyla Dryer, PA-C [address] [place],  [place] [id number] PCP: Soyla Dryer, PA-C   Assessment & Plan: Visit Diagnoses:  1. 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 Register over 40 years ago. She did well until about 2 or 3 weeks ago when she developed insidious onset of left foot pain. No injury or trauma. The pain is localized behind the malleolar I. She could have either a small tear of the posterior tibial tendon or one of the peroneus tendons. She has swelling behind the medial malleolus but not behind the lateral malleolus. I reviewed her x-rays and did not see any acute abnormalities. I am going to place her in an equalizer boot for 4 weeks and monitor response. Consider MRI scan if no improvement. She is not working and has applied for disability  Follow-Up Instructions: Return in about 1 month (around 12/14/2019).   Orders:  No orders of the defined types were placed in this encounter.  No orders of the defined types were placed in this encounter.     Procedures: No procedures performed   Clinical Data: No additional findings.   Subjective: Chief Complaint  Patient presents with  . Left Foot - Pain  Patient presents today for her left foot. She states that she was born with club foot on that side and had surgical correction when she was 54years old. Her foot has been bothering her for two weeks with no injury. She said that it feels like it will give out with walking. Her pain is located laterally and around her ankle. She is taking Diclofenac for pain. She states that her foot will swell after weightbearing. She had x-rays taken on 10/19/2019 and they are located within the PACS system.  Films are reviewed on the PACS system. There are obvious  abnormalities related to her clubfoot. There is flattening of the talus at the ankle joint. There also is dorsal subluxation of the midfoot compared to the hindfoot.. Some degenerative changes within the midfoot. No acute changes  HPI  Review of Systems  Constitutional: Negative for fatigue.  HENT: Negative for ear pain.   Eyes: Negative for pain.  Respiratory: Negative for shortness of breath.   Cardiovascular: Negative for leg swelling.  Gastrointestinal: Negative for constipation and diarrhea.  Endocrine: Negative for cold intolerance and heat intolerance.  Genitourinary: Negative for difficulty urinating.  Musculoskeletal: Positive for joint swelling.  Skin: Negative for rash.  Allergic/Immunologic: Negative for food allergies.  Neurological: Negative for weakness.  Hematological: Does not bruise/bleed easily.  Psychiatric/Behavioral: Positive for sleep disturbance.     Objective: Vital Signs: Ht 4\' 11"  (1.499 m)   Wt 143 lb (64.9 kg)   BMI 28.88 kg/m   Physical Exam Constitutional:      Appearance: She is well-developed.  Eyes:     Pupils: Pupils are equal, round, and reactive to light.  Pulmonary:     Effort: Pulmonary effort is normal.  Skin:    General: Skin is warm and dry.  Neurological:     Mental Status: She is alert and oriented to person, place, and  time.  Psychiatric:        Behavior: Behavior normal.     Ortho Exam awake alert and oriented x3. Comfortable sitting. Had clubfoot surgery on the left at age 57. All of the incisions are healed nicely. She has good sensibility of the foot. She is able to dorsiflex her great toe but not the lesser toes. She is able to flex all of her toes. No swelling or ecchymosis about the foot or ankle. Left foot is obviously smaller than the right consistent with her clubfoot. Achilles tendon is tight but intact. There is limited subtalar motion. There is about 10 to 15 degrees of dorsiflexion and volar flexion of the ankle. No  pain about the malleolar lie but there is some tenderness over the posterior tibial tendon associated with some mild swelling and pain along the peroneus brevis and longus tendons. There is prominence at the midportion of the midfoot consistent with her x-rays with dorsal subluxation. No plantar pain. No pain about the Achilles good capillary refill to the toes  Specialty Comments:  No specialty comments available.  Imaging: No results found.   PMFS History: Patient Active Problem List   Diagnosis Date Noted  . Hypertensive crisis 07/03/2019  . TIA (transient ischemic attack) 07/02/2019  . Pain in left foot 12/23/2015  . Cigarette nicotine dependence without complication 12/23/2015  . Essential hypertension, benign 12/23/2015  . History of alcohol abuse 12/23/2015  . Abdominal pain, other specified site 09/20/2012  . Nausea with vomiting 09/20/2012  . Abdominal bloating 12/09/2011  . PUD (peptic ulcer disease) 12/09/2011  . Epigastric pain 10/01/2011  . Hematemesis 10/01/2011  . Abnormal CT scan, chest 10/01/2011  . Abnormal CT scan 10/01/2011   Past Medical History:  Diagnosis Date  . Chronic back pain   . Chronic pain in left foot   . Cleft foot L foot  . Degenerative joint disease   . Depression 2003  . Hernia, hiatal   . Hiatal hernia   . Hypertension   . PUD (peptic ulcer disease)    ? ETOH and ASA use  . Sciatica     Family History  Problem Relation Age of Onset  . Hypertension Mother   . Heart failure Father        CHF  . Alcohol abuse Father   . Cancer Father        Stomach Cancer  . Colon cancer Neg Hx     Past Surgical History:  Procedure Laterality Date  . CESAREAN SECTION     x2  . CHOLECYSTECTOMY  1993  . CLUB FOOT RELEASE     left foot  . ESOPHAGOGASTRODUODENOSCOPY  10/06/2011   ulcers,multiple in antrum and duodenitis/hiatal hernia  . ESOPHAGOGASTRODUODENOSCOPY  12/15/2011   SLF: Moderate gastritis/ Hiatal hernia/PUD IMPROVED BUT NOT RESOLVED  DUE TO ONGOING ASA USE  . TUBAL LIGATION  1999   Social History   Occupational History  . Not on file  Tobacco Use  . Smoking status: Current Every Day Smoker    Packs/day: 0.50    Years: 36.00    Pack years: 18.00    Types: Cigarettes  . Smokeless tobacco: Never Used  Substance and Sexual Activity  . Alcohol use: Not Currently    Alcohol/week: 67.0 standard drinks    Types: 67 Cans of beer per week    Comment: 40 oz beer every other day.  none since 07/10/19  . Drug use: Not Currently    Types: Marijuana  .  Sexual activity: Yes    Birth control/protection: None, Surgical

## 2019-11-23 ENCOUNTER — Other Ambulatory Visit: Payer: Self-pay | Admitting: Physician Assistant

## 2019-12-13 ENCOUNTER — Other Ambulatory Visit: Payer: Self-pay | Admitting: Physician Assistant

## 2019-12-14 ENCOUNTER — Ambulatory Visit (INDEPENDENT_AMBULATORY_CARE_PROVIDER_SITE_OTHER): Payer: Self-pay | Admitting: Orthopaedic Surgery

## 2019-12-14 ENCOUNTER — Other Ambulatory Visit: Payer: Self-pay

## 2019-12-14 ENCOUNTER — Encounter: Payer: Self-pay | Admitting: Orthopaedic Surgery

## 2019-12-14 VITALS — Ht 59.0 in | Wt 143.0 lb

## 2019-12-14 DIAGNOSIS — Z87768 Personal history of other specified (corrected) congenital malformations of integument, limbs and musculoskeletal system: Secondary | ICD-10-CM

## 2019-12-14 DIAGNOSIS — M79672 Pain in left foot: Secondary | ICD-10-CM

## 2019-12-14 DIAGNOSIS — Z8776 Personal history of (corrected) congenital malformations of integument, limbs and musculoskeletal system: Secondary | ICD-10-CM

## 2019-12-14 NOTE — Progress Notes (Signed)
Office Visit Note   Patient: Jasmine Davies           Date of Birth: August 21, 1965           MRN: [id number] Visit Date: 12/14/2019              Requested by: Jacquelin Hawking, PA-C [address] [place],  [place] [address] PCP: Jacquelin Hawking, PA-C   Assessment & Plan: Visit Diagnoses:  1. Pain in left foot   2. 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 #2: Follow back up after MRI scan.   Follow-Up Instructions: No follow-ups on file.   Orders:  No orders of the defined types were placed in this encounter.  No orders of the defined types were placed in this encounter.     Procedures: No procedures performed   Clinical Data: No additional findings.   Subjective: Chief Complaint  Patient presents with  . Left Foot - Follow-up   HPI Patient presents today for follow up on her left foot. She said that the pain is still there. No improvement at all. She said that for the last three weeks she also has numbness in all her toes. She takes Voltaren orally and topically and states that it does not help.    Review of Systems  Constitutional: Negative for fatigue.  HENT: Negative for ear discharge.   Eyes: Negative for pain.  Respiratory: Negative for shortness of breath.   Cardiovascular: Negative for leg swelling.  Gastrointestinal: Negative for constipation and diarrhea.  Endocrine: Negative for cold intolerance and heat intolerance.  Genitourinary: Negative for difficulty urinating.  Musculoskeletal: Negative for joint swelling.  Skin: Negative for rash.  Allergic/Immunologic: Negative for food allergies.  Neurological: Negative for weakness.  Hematological: Does not bruise/bleed easily.  Psychiatric/Behavioral: Positive for sleep disturbance.     Objective: Vital Signs: Ht 4\' 11"  (1.499 m)   Wt 143 lb (64.9 kg)   BMI 28.88 kg/m   Physical  Exam Constitutional:      Appearance: Normal appearance. She is well-developed and normal weight.  HENT:     Head: Normocephalic.  Eyes:     Pupils: Pupils are equal, round, and reactive to light.  Pulmonary:     Effort: Pulmonary effort is normal.  Skin:    General: Skin is warm and dry.  Neurological:     Mental Status: She is alert and oriented to person, place, and time.  Psychiatric:        Behavior: Behavior normal.     Ortho Exam  Tenderness posteriorly of foot and ankle to palpation.  Numbness across dorsum of her foot into toes.  This has been since in boot.    Specialty Comments:  No specialty comments available.  Imaging: No results found.   PMFS History: Current Outpatient Medications  Medication Sig Dispense Refill  . aspirin EC 81 MG tablet Take 81 mg by mouth daily.    . citalopram (CELEXA) 20 MG tablet TAKE ONE (1) TABLET EACH DAY 30 tablet 1  . diclofenac (VOLTAREN) 75 MG EC tablet TAKE ONE TABLET TWICE DAILY AS NEEDED 30 tablet 1  . lisinopril (ZESTRIL) 20 MG tablet Take 1 tablet (20 mg total) by mouth daily. 30 tablet 3   No current facility-administered medications for this visit.    Patient Active Problem List  Diagnosis Date Noted  . 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 Hypertensive crisis 07/03/2019  . TIA (transient ischemic attack) 07/02/2019  . Pain in left foot 12/23/2015  . Cigarette nicotine dependence without complication [id number]/23/5573  . Essential hypertension, benign 12/23/2015  . History of alcohol abuse 12/23/2015  . Abdominal pain, other specified site 09/20/2012  . Nausea with vomiting 09/20/2012  . Abdominal bloating 12/09/2011  . PUD (peptic ulcer disease) 12/09/2011  . Epigastric pain 10/01/2011  . Hematemesis 10/01/2011  . Abnormal CT scan, chest 10/01/2011  . Abnormal CT scan 10/01/2011   Past Medical History:  Diagnosis Date  . Chronic back pain   . Chronic pain in left foot   . Cleft foot L foot   . Degenerative joint disease   . Depression 2003  . Hernia, hiatal   . Hiatal hernia   . Hypertension   . PUD (peptic ulcer disease)    ? ETOH and ASA use  . Sciatica     Family History  Problem Relation Age of Onset  . Hypertension Mother   . Heart failure Father        CHF  . Alcohol abuse Father   . Cancer Father        Stomach Cancer  . Colon cancer Neg Hx     Past Surgical History:  Procedure Laterality Date  . CESAREAN SECTION     x2  . CHOLECYSTECTOMY  1993  . CLUB FOOT RELEASE     left foot  . ESOPHAGOGASTRODUODENOSCOPY  10/06/2011   ulcers,multiple in antrum and duodenitis/hiatal hernia  . ESOPHAGOGASTRODUODENOSCOPY  12/15/2011   SLF: Moderate gastritis/ Hiatal hernia/PUD IMPROVED BUT NOT RESOLVED DUE TO ONGOING ASA USE  . TUBAL LIGATION  1999   Social History   Occupational History  . Not on file  Tobacco Use  . Smoking status: Current Every Day Smoker    Packs/day: 0.50    Years: 36.00    Pack years: 18.00    Types: Cigarettes  . Smokeless tobacco: Never Used  Substance and Sexual Activity  . Alcohol use: Not Currently    Alcohol/week: 67.0 standard drinks    Types: 67 Cans of beer per week    Comment: 40 oz beer every other day.  none since 07/10/19  . Drug use: Not Currently    Types: Marijuana  . Sexual activity: Yes    Birth control/protection: None, Surgical

## 2019-12-26 ENCOUNTER — Telehealth: Payer: Self-pay | Admitting: Orthopaedic Surgery

## 2019-12-26 NOTE — Telephone Encounter (Signed)
Called patient left message to call back to schedule an appointment with Dr Cleophas Dunker for MRI review    MRI scheduled  01/17/2020

## 2020-01-03 ENCOUNTER — Other Ambulatory Visit: Payer: Self-pay | Admitting: Physician Assistant

## 2020-01-03 DIAGNOSIS — I10 Essential (primary) hypertension: Secondary | ICD-10-CM

## 2020-01-09 ENCOUNTER — Encounter: Payer: Self-pay | Admitting: Orthopaedic Surgery

## 2020-01-09 ENCOUNTER — Ambulatory Visit (INDEPENDENT_AMBULATORY_CARE_PROVIDER_SITE_OTHER): Payer: Self-pay | Admitting: Orthopaedic Surgery

## 2020-01-09 ENCOUNTER — Other Ambulatory Visit: Payer: Self-pay

## 2020-01-09 VITALS — Ht 59.0 in | Wt 143.0 lb

## 2020-01-09 DIAGNOSIS — M79672 Pain in left foot: Secondary | ICD-10-CM

## 2020-01-09 NOTE — Progress Notes (Signed)
Office Visit Note   Patient: Jasmine Davies           Date of Birth: 12/12/1965           MRN: [id number] Visit Date: 01/09/2020              Requested by: Soyla Dryer, PA-C [address] [place],  [place] [address] PCP: Soyla Dryer, PA-C   Assessment & Plan: Visit Diagnoses:  1. Pain in left foot     Plan: This is some has had MRI of her left foot scheduled for 9 June.  She has been having trouble with the foot for months.  As a child she had a clubfoot with releases.  She has been wearing a Manufacturing systems engineer which seems to be "a little better" but still having some recurrent swelling particularly along the lateral aspect of her foot.  In addition she has had some tingling into the dorsum of her foot and her toes which seems to be a little more prominent since she has been wearing the boot but not "caused by the boot".  Exam today was relatively benign in the sense that there was no erythema or ecchymosis or edema.  She had good pulses.  Was having some pain behind the lateral malleolus and she could have an injury or tear of the peroneal tendons.  Will await results of the MRI scan and continue with the boot.  I will eventually refer to Dr. Sharol Given Follow-Up Instructions: Return After MRI scan left foot.   Orders:  No orders of the defined types were placed in this encounter.  No orders of the defined types were placed in this encounter.     Procedures: No procedures performed   Clinical Data: No additional findings.   Subjective: Chief Complaint  Patient presents with  . Left Foot - Follow-up  Patient presents today for follow up on her left foot. She was here on 12-14-2019. She is scheduled for an MRI of her foot and ankle on 01-17-2020. She said that her ankle is swollen and painful. She is walking in a Dietitian. She is not taking anything for pain.  Foot is actually feeling better than it was several days ago where she did have some swelling of her foot  without injury or trauma   Review of Systems   Objective: Vital Signs: Ht 4\' 11"  (1.499 m)   Wt 143 lb (64.9 kg)   BMI 28.88 kg/m   Physical Exam Constitutional:      Appearance: She is well-developed.  Eyes:     Pupils: Pupils are equal, round, and reactive to light.  Pulmonary:     Effort: Pulmonary effort is normal.  Skin:    General: Skin is warm and dry.  Neurological:     Mental Status: She is alert and oriented to person, place, and time.  Psychiatric:        Behavior: Behavior normal.     Ortho Exam left foot has varus drift of all of her toes that can be corrected to neutral.  Good pulses and good capillary refill to toes.  Does have several areas of tenderness behind the medial and lateral malleolar line but no skin changes.  No pain directly over the medial malleolus or lateral malleolus.  Has a positive Tinel's over the peroneal nerves at a bony prominence in the midfoot consistent with some compression of the probably both deep and superficial branches of the peroneal nerve.  I  am able to dorsiflex her foot about 20 degrees and plantar flex may be 10 degrees consistent with her old clubfoot surgery.  Very minimal subtalar motion  Specialty Comments:  No specialty comments available.  Imaging: No results found.   PMFS History: Patient Active Problem List   Diagnosis Date Noted  . 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 Hypertensive crisis 07/03/2019  . TIA (transient ischemic attack) 07/02/2019  . Pain in left foot 12/23/2015  . Cigarette nicotine dependence without complication 12/23/2015  . Essential hypertension, benign 12/23/2015  . History of alcohol abuse 12/23/2015  . Abdominal pain, other specified site 09/20/2012  . Nausea with vomiting 09/20/2012  . Abdominal bloating 12/09/2011  . PUD (peptic ulcer disease) 12/09/2011  . Epigastric pain 10/01/2011  . Hematemesis 10/01/2011  . Abnormal CT scan, chest 10/01/2011  . Abnormal CT  scan 10/01/2011   Past Medical History:  Diagnosis Date  . Chronic back pain   . Chronic pain in left foot   . Cleft foot L foot  . Degenerative joint disease   . Depression 2003  . Hernia, hiatal   . Hiatal hernia   . Hypertension   . PUD (peptic ulcer disease)    ? ETOH and ASA use  . Sciatica     Family History  Problem Relation Age of Onset  . Hypertension Mother   . Heart failure Father        CHF  . Alcohol abuse Father   . Cancer Father        Stomach Cancer  . Colon cancer Neg Hx     Past Surgical History:  Procedure Laterality Date  . CESAREAN SECTION     x2  . CHOLECYSTECTOMY  1993  . CLUB FOOT RELEASE     left foot  . ESOPHAGOGASTRODUODENOSCOPY  10/06/2011   ulcers,multiple in antrum and duodenitis/hiatal hernia  . ESOPHAGOGASTRODUODENOSCOPY  12/15/2011   SLF: Moderate gastritis/ Hiatal hernia/PUD IMPROVED BUT NOT RESOLVED DUE TO ONGOING ASA USE  . TUBAL LIGATION  1999   Social History   Occupational History  . Not on file  Tobacco Use  . Smoking status: Current Every Day Smoker    Packs/day: 0.50    Years: 36.00    Pack years: 18.00    Types: Cigarettes  . Smokeless tobacco: Never Used  Substance and Sexual Activity  . Alcohol use: Not Currently    Alcohol/week: 67.0 standard drinks    Types: 67 Cans of beer per week    Comment: 40 oz beer every other day.  none since 07/10/19  . Drug use: Not Currently    Types: Marijuana  . Sexual activity: Yes    Birth control/protection: None, Surgical

## 2020-01-12 ENCOUNTER — Other Ambulatory Visit (HOSPITAL_COMMUNITY)
Admission: RE | Admit: 2020-01-12 | Discharge: 2020-01-12 | Disposition: A | Discharge status: Home / Self Care | Payer: Self-pay | Source: Ambulatory Visit | Attending: Physician Assistant | Admitting: Physician Assistant

## 2020-01-12 ENCOUNTER — Other Ambulatory Visit: Payer: Self-pay

## 2020-01-12 DIAGNOSIS — I10 Essential (primary) hypertension: Secondary | ICD-10-CM | POA: Insufficient documentation

## 2020-01-12 LAB — BASIC METABOLIC PANEL
Anion gap: 9 (ref 5–15)
BUN: 14 mg/dL (ref 6–20)
CO2: 26 mmol/L (ref 22–32)
Calcium: 8.4 mg/dL — ABNORMAL LOW (ref 8.9–10.3)
Chloride: 102 mmol/L (ref 98–111)
Creatinine, Ser: 0.76 mg/dL (ref 0.44–1.00)
GFR calc Af Amer: >60 mL/min (ref >60)
GFR calc non Af Amer: >60 mL/min (ref >60)
Glucose, Bld: 193 mg/dL — ABNORMAL HIGH (ref 70–99)
Potassium: 3.6 mmol/L (ref 3.5–5.1)
Sodium: 137 mmol/L (ref 135–145)

## 2020-01-15 ENCOUNTER — Other Ambulatory Visit: Payer: Self-pay

## 2020-01-15 ENCOUNTER — Encounter: Payer: Self-pay | Admitting: Physician Assistant

## 2020-01-15 ENCOUNTER — Ambulatory Visit: Payer: No Typology Code available for payment source | Admitting: Physician Assistant

## 2020-01-15 VITALS — BP 130/80 | HR 65 | Temp 97.7°F | Wt 150.5 lb

## 2020-01-15 DIAGNOSIS — F17200 Nicotine dependence, unspecified, uncomplicated: Secondary | ICD-10-CM

## 2020-01-15 DIAGNOSIS — M79672 Pain in left foot: Secondary | ICD-10-CM

## 2020-01-15 DIAGNOSIS — I10 Essential (primary) hypertension: Secondary | ICD-10-CM

## 2020-01-15 DIAGNOSIS — F419 Anxiety disorder, unspecified: Secondary | ICD-10-CM

## 2020-01-15 DIAGNOSIS — F1011 Alcohol abuse, in remission: Secondary | ICD-10-CM

## 2020-01-15 MED ORDER — LISINOPRIL 20 MG PO TABS: 20.0000 mg | ORAL_TABLET | Freq: Every day | ORAL | 1 refills | Status: DC

## 2020-01-15 MED ORDER — DICLOFENAC SODIUM 75 MG PO TBEC: DELAYED_RELEASE_TABLET | ORAL | 1 refills | Status: DC

## 2020-01-15 MED ORDER — CITALOPRAM HYDROBROMIDE 20 MG PO TABS: ORAL_TABLET | ORAL | 1 refills | Status: DC

## 2020-01-15 MED ORDER — LISINOPRIL 20 MG PO TABS: 20.0000 mg | ORAL_TABLET | Freq: Every day | ORAL | 3 refills | Status: DC

## 2020-01-15 NOTE — Progress Notes (Signed)
BP 130/80   Pulse 65   Temp 97.7 F (36.5 C)   Wt 150 lb 8 oz (68.3 kg)   SpO2 97%   BMI 30.40 kg/m    Subjective:    Patient ID: Jasmine Davies, female    DOB: 27-Aug-1965, 54 y.o.   MRN: [id number]  HPI: Jasmine Davies is a 54 y.o. female presenting on 01/15/2020 for Hypertension and Mental Health Problem   HPI   Pt had a negative covid 19 screening questionnaire.     Pt is 54yoF with HTN.    Pt saw orthopedics for her foot pain.  She says she has MRI Wednedsday.    Pt says she isn't good off her mood meds.   She wants increase.  She says she is stressed out.  She says the old man she lives with has dementia.   She is not going to [facility].  She hasn't been there in long time.   She is still abstaining from etoh.  She says she has the [facility] financial assistance  Pt got both doses covid vaccination    Relevant past medical, surgical, family and social history reviewed and updated as indicated. Interim medical history since our last visit reviewed. Allergies and medications reviewed and updated.   Current Outpatient Medications:  .  aspirin EC 81 MG tablet, Take 81 mg by mouth daily., Disp: , Rfl:  .  lisinopril (ZESTRIL) 20 MG tablet, Take 1 tablet (20 mg total) by mouth daily., Disp: 30 tablet, Rfl: 3 .  citalopram (CELEXA) 20 MG tablet, TAKE ONE (1) TABLET EACH DAY (Patient not taking: Reported on 01/15/2020), Disp: 30 tablet, Rfl: 1 .  diclofenac (VOLTAREN) 75 MG EC tablet, TAKE ONE TABLET TWICE DAILY AS NEEDED (Patient not taking: Reported on 01/15/2020), Disp: 30 tablet, Rfl: 1    Review of Systems  Per HPI unless specifically indicated above     Objective:    BP 130/80   Pulse 65   Temp 97.7 F (36.5 C)   Wt 150 lb 8 oz (68.3 kg)   SpO2 97%   BMI 30.40 kg/m   Wt Readings from Last 3 Encounters:  01/15/20 150 lb 8 oz (68.3 kg)  01/09/20 143 lb (64.9 kg)  12/14/19 143 lb (64.9 kg)    Physical Exam Vitals reviewed.  Constitutional:      General: She is  not in acute distress.    Appearance: She is well-developed.  HENT:     Head: Normocephalic and atraumatic.  Cardiovascular:     Rate and Rhythm: Normal rate and regular rhythm.  Pulmonary:     Effort: Pulmonary effort is normal.     Breath sounds: Normal breath sounds.  Abdominal:     General: Bowel sounds are normal.     Palpations: Abdomen is soft. There is no mass.     Tenderness: There is no abdominal tenderness.  Musculoskeletal:     Cervical back: Neck supple.     Right lower leg: No edema.     Left lower leg: No edema.  Lymphadenopathy:     Cervical: No cervical adenopathy.  Skin:    General: Skin is warm and dry.  Neurological:     Mental Status: She is alert and oriented to person, place, and time.  Psychiatric:        Attention and Perception: Attention normal.        Behavior: Behavior normal. Behavior is cooperative.     Results for orders  placed or performed during the hospital encounter of 08/11/70  Basic metabolic panel  Result Value Ref Range   Sodium 137 135 - 145 mmol/L   Potassium 3.6 3.5 - 5.1 mmol/L   Chloride 102 98 - 111 mmol/L   CO2 26 22 - 32 mmol/L   Glucose, Bld 193 (H) 70 - 99 mg/dL   BUN 14 6 - 20 mg/dL   Creatinine, Ser 0.76 0.44 - 1.00 mg/dL   Calcium 8.4 (L) 8.9 - 10.3 mg/dL   GFR calc non Af Amer >60 >60 mL/min   GFR calc Af Amer >60 >60 mL/min   Anion gap 9 5 - 15      Assessment & Plan:   Encounter Diagnoses  Name Primary?  . Essential hypertension, benign Yes  . Anxiety   . Left foot pain   . Tobacco use disorder   . Alcohol abuse, in remission       -Reviewed labs with pt -encouraged pt to return to [facility] -pt to get back on her citalopram -pt to follow up 4-6 wk to recheck mood.  She is to contact office sooner prn

## 2020-01-17 ENCOUNTER — Ambulatory Visit
Admission: RE | Admit: 2020-01-17 | Discharge: 2020-01-17 | Disposition: A | Discharge status: Home / Self Care | Payer: No Typology Code available for payment source | Source: Ambulatory Visit | Attending: Orthopaedic Surgery | Admitting: Orthopaedic Surgery

## 2020-01-17 DIAGNOSIS — Z87768 Personal history of other specified (corrected) congenital malformations of integument, limbs and musculoskeletal system: Secondary | ICD-10-CM

## 2020-01-17 DIAGNOSIS — M79672 Pain in left foot: Secondary | ICD-10-CM

## 2020-01-31 ENCOUNTER — Ambulatory Visit: Payer: No Typology Code available for payment source | Admitting: Orthopaedic Surgery

## 2020-02-13 ENCOUNTER — Other Ambulatory Visit: Payer: Self-pay

## 2020-02-13 ENCOUNTER — Ambulatory Visit (INDEPENDENT_AMBULATORY_CARE_PROVIDER_SITE_OTHER): Payer: No Typology Code available for payment source | Admitting: Orthopaedic Surgery

## 2020-02-13 ENCOUNTER — Encounter: Payer: Self-pay | Admitting: Orthopaedic Surgery

## 2020-02-13 VITALS — Ht 59.0 in | Wt 150.0 lb

## 2020-02-13 DIAGNOSIS — M79672 Pain in left foot: Secondary | ICD-10-CM

## 2020-02-13 NOTE — Addendum Note (Signed)
Addended by: Wendi Maya on: 02/13/2020 02:44 PM   Modules accepted: Orders

## 2020-02-13 NOTE — Progress Notes (Signed)
Office Visit Note   Patient: Jasmine Davies           Date of Birth: 08/25/1965           MRN: [id number] Visit Date: 02/13/2020              Requested by: Jacquelin Hawking, PA-C [address] [place],  [place] [address] PCP: Jacquelin Hawking, PA-C   Assessment & Plan: Visit Diagnoses:  1. Pain in left foot     Plan: Jasmine Davies had an MRI scan of her left foot and ankle.  Soft tissues appear to be intact.  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  She wears a boot for pain relief.  She has had prior clubfoot surgery as a child.  I had like Dr. Lajoyce Corners to evaluate her and will make the referral.  Discussed this at length with Jasmine Davies  Follow-Up Instructions: Return Refer to Dr. Lajoyce Corners.   Orders:  No orders of the defined types were placed in this encounter.  No orders of the defined types were placed in this encounter.     Procedures: No procedures performed   Clinical Data: No additional findings.   Subjective: Chief Complaint  Patient presents with  . Left Ankle - Follow-up    MRI review  . Left Foot - Follow-up    MRI review  Patient presents today for follow up on her left ankle and foot. She had an MRI done on both on 01-17-2020. She is here today for those results. No changes since her last visit. She is walking in her short equalizer boot.   HPI  Review of Systems   Objective: Vital Signs: Ht 4\' 11"  (1.499 m)   Wt 150 lb (68 kg)   BMI 30.30 kg/m   Physical Exam Constitutional:      Appearance: She is well-developed.  Eyes:     Pupils: Pupils are equal, round, and reactive to light.  Pulmonary:     Effort: Pulmonary effort is normal.  Skin:    General: Skin is warm and dry.  Neurological:     Mental Status: She is alert and oriented to person, place, and time.  Psychiatric:        Behavior: Behavior normal.     Ortho Exam awake alert and oriented x3.  Comfortable sitting.  Does wear an equalizer boot in her  left foot and ankle.  This was removed.  Skin intact.  Good capillary refill to toes.  Normal sensation.  The left foot is smaller than the right.  There is dorsiflexion to neutral and about 20 degrees of plantarflexion.  No subtalar motion with pain.  Prominence of the midfoot dorsally secondary to osteophytes.  No pain today behind either malleoli.  No plantar pain.  Specialty Comments:  No specialty comments available.  Imaging: No results found.   PMFS History: Patient Active Problem List   Diagnosis Date Noted  . 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 Hypertensive crisis 07/03/2019  . TIA (transient ischemic attack) 07/02/2019  . Pain in left foot 12/23/2015  . Cigarette nicotine dependence without complication 12/23/2015  . Essential hypertension, benign 12/23/2015  . History of alcohol abuse 12/23/2015  . Abdominal pain, other specified site 09/20/2012  . Nausea with vomiting 09/20/2012  . Abdominal bloating 12/09/2011  . PUD (peptic ulcer disease) 12/09/2011  . Epigastric pain 10/01/2011  . Hematemesis 10/01/2011  . Abnormal CT  scan, chest 10/01/2011  . Abnormal CT scan 10/01/2011   Past Medical History:  Diagnosis Date  . Chronic back pain   . Chronic pain in left foot   . Cleft foot L foot  . Degenerative joint disease   . Depression 2003  . Hernia, hiatal   . Hiatal hernia   . Hypertension   . PUD (peptic ulcer disease)    ? ETOH and ASA use  . Sciatica     Family History  Problem Relation Age of Onset  . Hypertension Mother   . Heart failure Father        CHF  . Alcohol abuse Father   . Cancer Father        Stomach Cancer  . Colon cancer Neg Hx     Past Surgical History:  Procedure Laterality Date  . CESAREAN SECTION     x2  . CHOLECYSTECTOMY  1993  . CLUB FOOT RELEASE     left foot  . ESOPHAGOGASTRODUODENOSCOPY  10/06/2011   ulcers,multiple in antrum and duodenitis/hiatal hernia  . ESOPHAGOGASTRODUODENOSCOPY  12/15/2011   SLF:  Moderate gastritis/ Hiatal hernia/PUD IMPROVED BUT NOT RESOLVED DUE TO ONGOING ASA USE  . TUBAL LIGATION  1999   Social History   Occupational History  . Not on file  Tobacco Use  . Smoking status: Current Every Day Smoker    Packs/day: 0.50    Years: 36.00    Pack years: 18.00    Types: Cigarettes  . Smokeless tobacco: Never Used  Vaping Use  . Vaping Use: Never used  Substance and Sexual Activity  . Alcohol use: Not Currently    Alcohol/week: 67.0 standard drinks    Types: 67 Cans of beer per week    Comment: 40 oz beer every other day.  none since 07/10/19  . Drug use: Not Currently    Types: Marijuana  . Sexual activity: Yes    Birth control/protection: None, Surgical

## 2020-02-22 ENCOUNTER — Encounter: Payer: Self-pay | Admitting: Orthopedic Surgery

## 2020-02-22 ENCOUNTER — Other Ambulatory Visit: Payer: Self-pay

## 2020-02-22 ENCOUNTER — Ambulatory Visit (INDEPENDENT_AMBULATORY_CARE_PROVIDER_SITE_OTHER): Payer: Self-pay | Admitting: Physician Assistant

## 2020-02-22 VITALS — Ht 59.0 in | Wt 150.0 lb

## 2020-02-22 DIAGNOSIS — Z87768 Personal history of other specified (corrected) congenital malformations of integument, limbs and musculoskeletal system: Secondary | ICD-10-CM

## 2020-02-22 DIAGNOSIS — Z8776 Personal history of (corrected) congenital malformations of integument, limbs and musculoskeletal system: Secondary | ICD-10-CM

## 2020-02-22 NOTE — Progress Notes (Signed)
Office Visit Note   Patient: Jasmine Davies           Date of Birth: 1965-08-17           MRN: [id number] Visit Date: 02/22/2020              Requested by: Valeria Batman, MD [address] [place],  [place] [address] PCP: Jacquelin Hawking, PA-C  Chief Complaint  Patient presents with  . Left Foot - Pain, New Patient (Initial Visit)      HPI: This is a pleasant 54 year old woman who comes in today for evaluation of a painful left foot.  She has a history of a congenital left clubfoot deformity.  For the most part this has not bothered her too much.  Recently she is beginning to get more pain especially along the lateral aspect of her foot.  It is making it difficult for her to walk.   Assessment & Plan: Visit Diagnoses: No diagnosis found.  Plan: I explained to the patient that I would like for her to come back next week and see Dr. Lajoyce Corners she is agreeable to this she would most likely need a hindfoot fusion to realign her foot and relieve the lateral impingement  Follow-Up Instructions: No follow-ups on file.   Ortho Exam  Patient is alert, oriented, no adenopathy, well-dressed, normal affect, normal respiratory effort. Focused examination of the left foot pulses palpable she is tender along the lateral side clubfoot deformity she actually does flex up to past a neutral position  Imaging: No results found. No images are attached to the encounter.  Labs: Lab Results  Component Value Date   HGBA1C 5.4 07/03/2019   HGBA1C 5.5 12/23/2015     Lab Results  Component Value Date   ALBUMIN 3.9 07/02/2019   ALBUMIN 3.9 06/18/2017   ALBUMIN 4.2 12/23/2015    No results found for: MG No results found for: VD25OH  No results found for: PREALBUMIN CBC EXTENDED Latest Ref Rng & Units 07/04/2019 07/02/2019 12/23/2015  WBC 4.0 - 10.5 K/uL 7.5 10.5 7.9  RBC 3.87 - 5.11 MIL/uL 4.02 4.41 4.46  HGB 12.0 - 15.0 g/dL 27.0 15.1(H) 14.6  HCT 36 - 46 % 42.0 44.3 43.6  PLT 150 - 400  K/uL 206 241 289  NEUTROABS 1,500 - 7,800 cells/uL - - 4,108  LYMPHSABS 850 - 3,900 cells/uL - - 2,765     Body mass index is 30.3 kg/m.  Orders:  No orders of the defined types were placed in this encounter.  No orders of the defined types were placed in this encounter.    Procedures: No procedures performed  Clinical Data: No additional findings.  ROS:  All other systems negative, except as noted in the HPI. Review of Systems  Objective: Vital Signs: Ht 4\' 11"  (1.499 m)   Wt 150 lb (68 kg)   BMI 30.30 kg/m   Specialty Comments:  No specialty comments available.  PMFS History: Patient Active Problem List   Diagnosis Date Noted  . 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 Hypertensive crisis 07/03/2019  . TIA (transient ischemic attack) 07/02/2019  . Pain in left foot 12/23/2015  . Cigarette nicotine dependence without complication 12/23/2015  . Essential hypertension, benign 12/23/2015  . History of alcohol abuse 12/23/2015  . Abdominal pain, other specified site 09/20/2012  . Nausea with vomiting 09/20/2012  . Abdominal bloating 12/09/2011  . PUD (peptic ulcer disease) 12/09/2011  . Epigastric pain 10/01/2011  .  Hematemesis 10/01/2011  . Abnormal CT scan, chest 10/01/2011  . Abnormal CT scan 10/01/2011   Past Medical History:  Diagnosis Date  . Chronic back pain   . Chronic pain in left foot   . Cleft foot L foot  . Degenerative joint disease   . Depression 2003  . Hernia, hiatal   . Hiatal hernia   . Hypertension   . PUD (peptic ulcer disease)    ? ETOH and ASA use  . Sciatica     Family History  Problem Relation Age of Onset  . Hypertension Mother   . Heart failure Father        CHF  . Alcohol abuse Father   . Cancer Father        Stomach Cancer  . Colon cancer Neg Hx     Past Surgical History:  Procedure Laterality Date  . CESAREAN SECTION     x2  . CHOLECYSTECTOMY  1993  . CLUB FOOT RELEASE     left foot  .  ESOPHAGOGASTRODUODENOSCOPY  10/06/2011   ulcers,multiple in antrum and duodenitis/hiatal hernia  . ESOPHAGOGASTRODUODENOSCOPY  12/15/2011   SLF: Moderate gastritis/ Hiatal hernia/PUD IMPROVED BUT NOT RESOLVED DUE TO ONGOING ASA USE  . TUBAL LIGATION  1999   Social History   Occupational History  . Not on file  Tobacco Use  . Smoking status: Current Every Day Smoker    Packs/day: 0.50    Years: 36.00    Pack years: 18.00    Types: Cigarettes  . Smokeless tobacco: Never Used  Vaping Use  . Vaping Use: Never used  Substance and Sexual Activity  . Alcohol use: Not Currently    Alcohol/week: 67.0 standard drinks    Types: 67 Cans of beer per week    Comment: 40 oz beer every other day.  none since 07/10/19  . Drug use: Not Currently    Types: Marijuana  . Sexual activity: Yes    Birth control/protection: None, Surgical

## 2020-02-26 ENCOUNTER — Encounter: Payer: Self-pay | Admitting: Physician Assistant

## 2020-02-26 ENCOUNTER — Ambulatory Visit: Payer: No Typology Code available for payment source | Admitting: Physician Assistant

## 2020-02-26 VITALS — BP 116/55

## 2020-02-26 DIAGNOSIS — I10 Essential (primary) hypertension: Secondary | ICD-10-CM

## 2020-02-26 DIAGNOSIS — F17200 Nicotine dependence, unspecified, uncomplicated: Secondary | ICD-10-CM

## 2020-02-26 DIAGNOSIS — F419 Anxiety disorder, unspecified: Secondary | ICD-10-CM

## 2020-02-26 NOTE — Progress Notes (Signed)
   BP (!) 116/55    Subjective:    Patient ID: Jasmine Davies, female    DOB: 12/16/1965, 54 y.o.   MRN: [id number]  HPI: Jasmine Davies is a 54 y.o. female presenting on 02/26/2020 for No chief complaint on file.   HPI  This is a telemedicine appointment due to coronavirus pandemic.  It is via Telephone as pt does not have video enabled device.  I connected with  Lujean Ebright Ruz on 02/26/20 by a video enabled telemedicine application and verified that I am speaking with the correct person using two identifiers.   I discussed the limitations of evaluation and management by telemedicine. The patient expressed understanding and agreed to proceed.  Pt is at home.  Provider is at office.     Pt says citalopram working well for her.  She is Not feeling stressed out.  She Didn't go back to [facility] because she didn't feel like she needed to.  She is talking to a friend of hers.  She denies SI, HI.  She monitors her bp at home and it is doing good.    Relevant past medical, surgical, family and social history reviewed and updated as indicated. Interim medical history since our last visit reviewed. Allergies and medications reviewed and updated.    Current Outpatient Medications:  .  aspirin EC 81 MG tablet, Take 81 mg by mouth daily., Disp: , Rfl:  .  citalopram (CELEXA) 20 MG tablet, TAKE ONE (1) TABLET EACH DAY, Disp: 30 tablet, Rfl: 1 .  diclofenac (VOLTAREN) 75 MG EC tablet, TAKE ONE TABLET TWICE DAILY AS NEEDED, Disp: 30 tablet, Rfl: 1 .  lisinopril (ZESTRIL) 20 MG tablet, Take 1 tablet (20 mg total) by mouth daily., Disp: 90 tablet, Rfl: 1   Review of Systems  Per HPI unless specifically indicated above     Objective:    BP (!) 116/55   Wt Readings from Last 3 Encounters:  02/22/20 150 lb (68 kg)  02/13/20 150 lb (68 kg)  01/15/20 150 lb 8 oz (68.3 kg)    Physical Exam Constitutional:      General: She is not in acute distress. Pulmonary:     Effort: No respiratory  distress.  Neurological:     Mental Status: She is alert and oriented to person, place, and time.  Psychiatric:        Attention and Perception: Attention normal.        Speech: Speech normal.        Behavior: Behavior is cooperative.            Assessment & Plan:    Encounter Diagnoses  Name Primary?  . Essential hypertension, benign Yes  . Anxiety   . Tobacco use disorder     -pt to continue current meds -Pt to follow up 3 months.  She is to contact office sooner prn

## 2020-02-29 ENCOUNTER — Encounter: Payer: Self-pay | Admitting: Orthopedic Surgery

## 2020-02-29 ENCOUNTER — Other Ambulatory Visit: Payer: Self-pay

## 2020-02-29 ENCOUNTER — Ambulatory Visit (INDEPENDENT_AMBULATORY_CARE_PROVIDER_SITE_OTHER): Payer: Self-pay | Admitting: Orthopedic Surgery

## 2020-02-29 VITALS — Ht 59.0 in | Wt 150.0 lb

## 2020-02-29 DIAGNOSIS — Z8776 Personal history of (corrected) congenital malformations of integument, limbs and musculoskeletal system: Secondary | ICD-10-CM

## 2020-02-29 DIAGNOSIS — M79672 Pain in left foot: Secondary | ICD-10-CM

## 2020-02-29 DIAGNOSIS — Z87768 Personal history of other specified (corrected) congenital malformations of integument, limbs and musculoskeletal system: Secondary | ICD-10-CM

## 2020-02-29 NOTE — Progress Notes (Signed)
Office Visit Note   Patient: Jasmine Davies           Date of Birth: Oct 28, 1965           MRN: [id number] Visit Date: 02/29/2020              Requested by: Jacquelin Hawking, PA-C [address] [place],  [place] [address] PCP: Jacquelin Hawking, PA-C  Chief Complaint  Patient presents with  . Left Foot - Follow-up      HPI: Patient is a 54 year old woman who presents for evaluation of left hindfoot pain.  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  Patient has pain with activities of daily living.  Assessment & Plan: Visit Diagnoses:  1. 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 Pain in left foot     Plan: Patient's pain is primarily in the subtalar joint despite the multiple malalignments throughout the midfoot.  We will plan for a posterior arthroscopic subtalar arthrodesis patient has atrophic skin dorsally and.  Does not have a good dorsalis pedis pulse.  Feel that most of her symptoms should resolve with the subtalar fusion.  We will plan for posterior arthroscopic subtalar arthrodesis.  Set this up as outpatient.  Patient will stop smoking perioperatively to minimize risks of complications.  Follow-Up Instructions: Return if symptoms worsen or fail to improve.   Ortho Exam  Patient is alert, oriented, no adenopathy, well-dressed, normal affect, normal respiratory effort. Examination patient has a good posterior tibial pulse I cannot feel a good dorsalis pedis pulse.  The skin on the dorsum of her foot is thin and atrophic.  Patient's pain primarily is in the sinus Tarsi and she has essentially no subtalar motion and has pain reproduced with attempted subtalar motion.  Patient is smoking and discussed the importance of smoking cessation to minimize risks of surgery.  Imaging: No results found. No images are attached to the encounter.  Labs: Lab Results  Component Value Date   HGBA1C 5.4 07/03/2019   HGBA1C 5.5 12/23/2015     Lab Results  Component Value  Date   ALBUMIN 3.9 07/02/2019   ALBUMIN 3.9 06/18/2017   ALBUMIN 4.2 12/23/2015    No results found for: MG No results found for: VD25OH  No results found for: PREALBUMIN CBC EXTENDED Latest Ref Rng & Units 07/04/2019 07/02/2019 12/23/2015  WBC 4.0 - 10.5 K/uL 7.5 10.5 7.9  RBC 3.87 - 5.11 MIL/uL 4.02 4.41 4.46  HGB 12.0 - 15.0 g/dL 32.4 15.1(H) 14.6  HCT 36 - 46 % 42.0 44.3 43.6  PLT 150 - 400 K/uL 206 241 289  NEUTROABS 1,500 - 7,800 cells/uL - - 4,108  LYMPHSABS 850 - 3,900 cells/uL - - 2,765     Body mass index is 30.3 kg/m.  Orders:  No orders of the defined types were placed in this encounter.  No orders of the defined types were placed in this encounter.    Procedures: No procedures performed  Clinical Data: No additional findings.  ROS:  All other systems negative, except as noted in the HPI. Review of Systems  Objective: Vital Signs: Ht 4\' 11"  (1.499 m)   Wt 150 lb (68 kg)   BMI 30.30 kg/m   Specialty Comments:  No specialty comments available.  PMFS History: Patient Active Problem List   Diagnosis Date Noted  . 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 Hypertensive crisis 07/03/2019  . TIA (transient ischemic attack) 07/02/2019  .  Pain in left foot 12/23/2015  . Cigarette nicotine dependence without complication 12/23/2015  . Essential hypertension, benign 12/23/2015  . History of alcohol abuse 12/23/2015  . Abdominal pain, other specified site 09/20/2012  . Nausea with vomiting 09/20/2012  . Abdominal bloating 12/09/2011  . PUD (peptic ulcer disease) 12/09/2011  . Epigastric pain 10/01/2011  . Hematemesis 10/01/2011  . Abnormal CT scan, chest 10/01/2011  . Abnormal CT scan 10/01/2011   Past Medical History:  Diagnosis Date  . Chronic back pain   . Chronic pain in left foot   . Cleft foot L foot  . Degenerative joint disease   . Depression 2003  . Hernia, hiatal   . Hiatal hernia   . Hypertension   . PUD (peptic  ulcer disease)    ? ETOH and ASA use  . Sciatica     Family History  Problem Relation Age of Onset  . Hypertension Mother   . Heart failure Father        CHF  . Alcohol abuse Father   . Cancer Father        Stomach Cancer  . Colon cancer Neg Hx     Past Surgical History:  Procedure Laterality Date  . CESAREAN SECTION     x2  . CHOLECYSTECTOMY  1993  . CLUB FOOT RELEASE     left foot  . ESOPHAGOGASTRODUODENOSCOPY  10/06/2011   ulcers,multiple in antrum and duodenitis/hiatal hernia  . ESOPHAGOGASTRODUODENOSCOPY  12/15/2011   SLF: Moderate gastritis/ Hiatal hernia/PUD IMPROVED BUT NOT RESOLVED DUE TO ONGOING ASA USE  . TUBAL LIGATION  1999   Social History   Occupational History  . Not on file  Tobacco Use  . Smoking status: Current Every Day Smoker    Packs/day: 0.50    Years: 36.00    Pack years: 18.00    Types: Cigarettes  . Smokeless tobacco: Never Used  Vaping Use  . Vaping Use: Never used  Substance and Sexual Activity  . Alcohol use: Not Currently    Alcohol/week: 67.0 standard drinks    Types: 67 Cans of beer per week    Comment: 40 oz beer every other day.  none since 07/10/19  . Drug use: Not Currently    Types: Marijuana  . Sexual activity: Yes    Birth control/protection: None, Surgical

## 2020-03-16 IMAGING — DX DG FOOT COMPLETE 3+V*L*
3 series · 3 of 3 positions shown · non-contrast
Comparison: Plain film of the LEFT foot dated 02/15/2012.

CLINICAL DATA: Foot pain and swelling, congenital defect.

EXAM:
LEFT FOOT - COMPLETE 3+ VIEW

[foot ap]
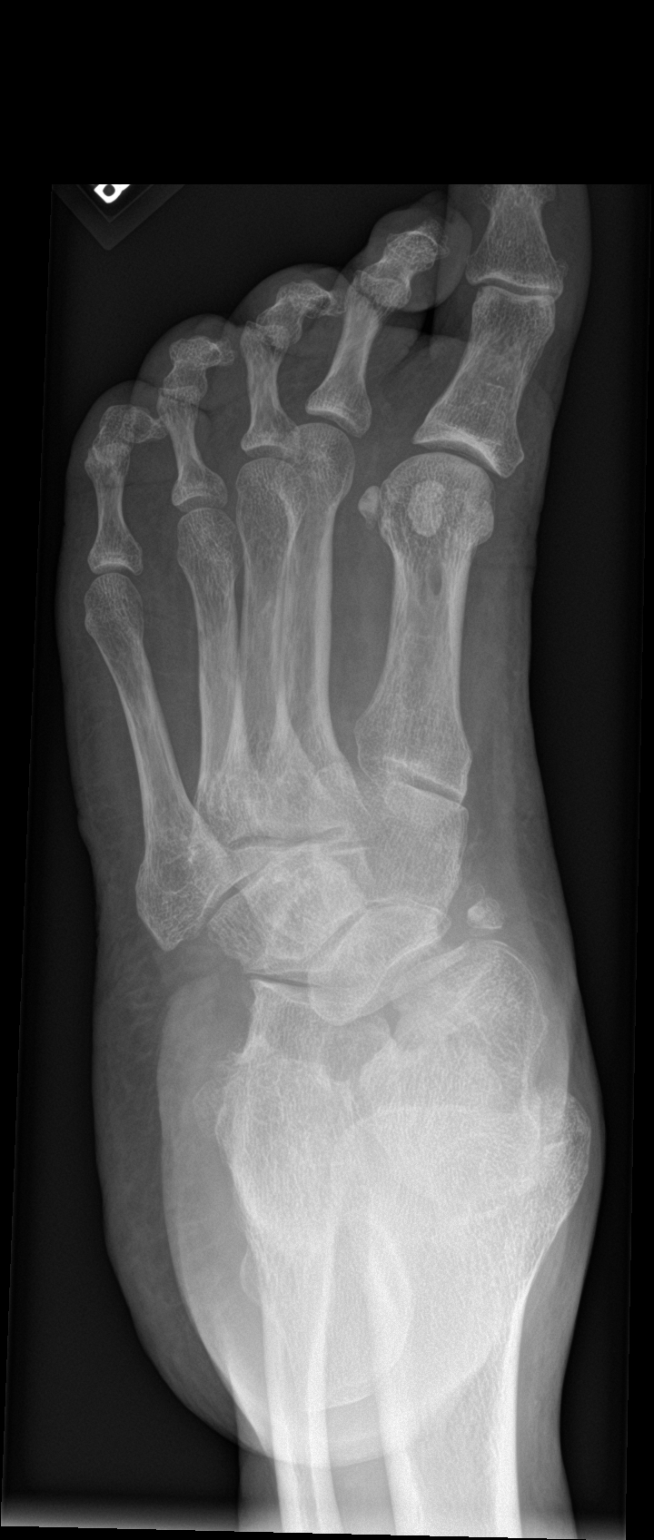

[foot obl]
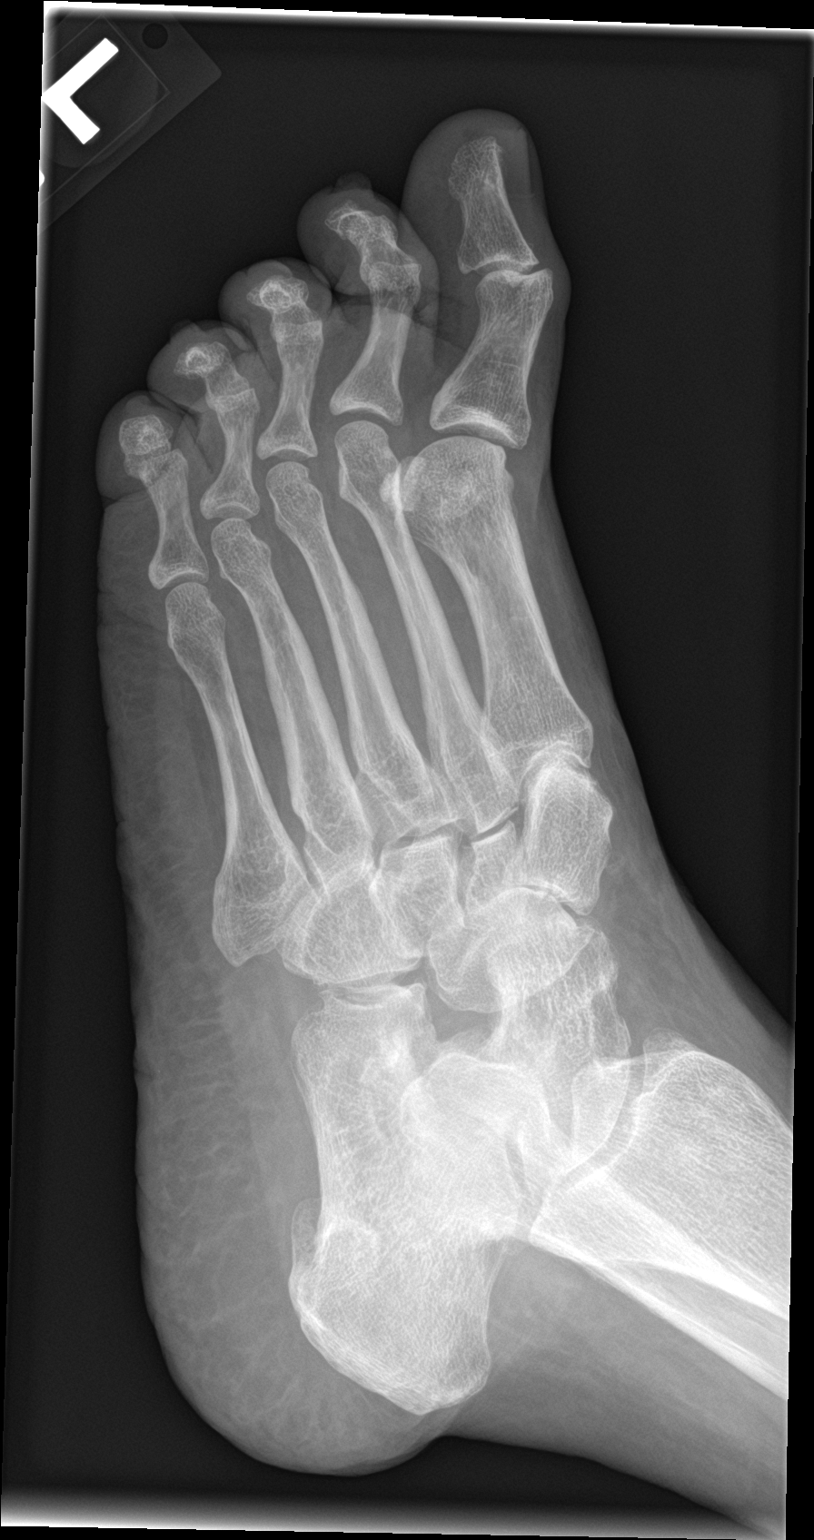

[foot lat]
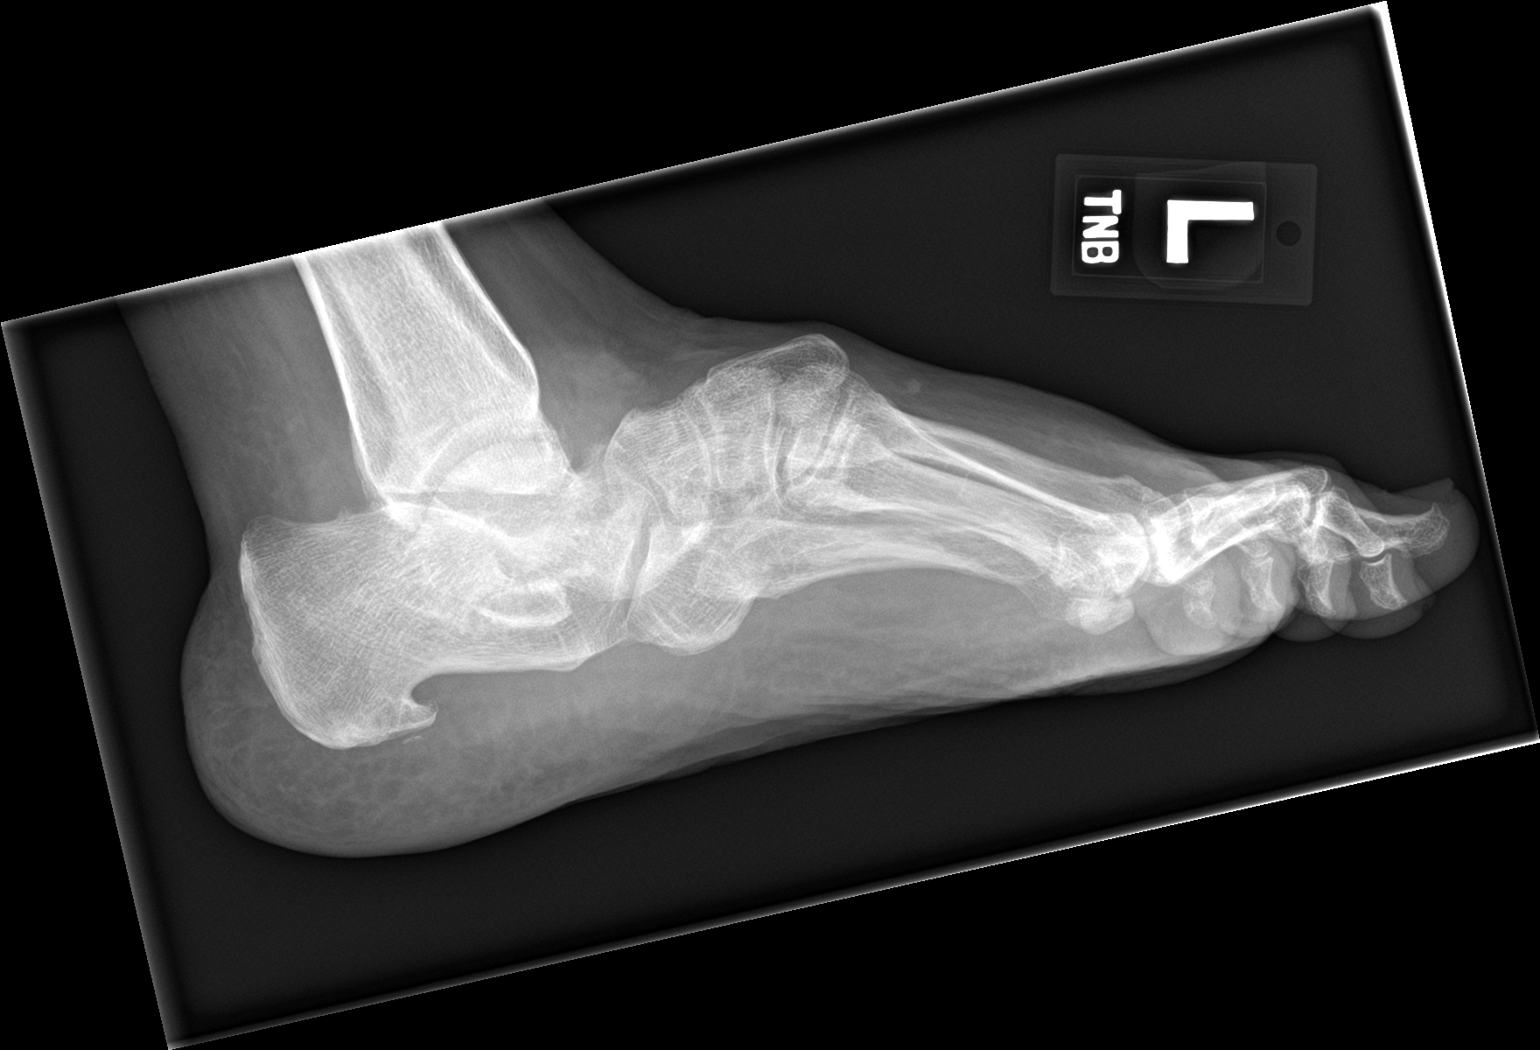

[3 of 3 positions shown; findings below may reference images not displayed]

FINDINGS: Stable osseous alignment, corresponding to the given history of
congenital clubfoot. No evidence of acute dislocation. No acute or
suspicious osseous finding.

Degenerative osteoarthritic changes at the first TMT joint, mild to
moderate in degree with associated articular surface sclerosis.
Chronic spurring at the plantar margin of the posterior calcaneus.
Soft tissues about the LEFT foot are unremarkable.
IMPRESSION: 1. No acute findings.
2. Degenerative osteoarthritic changes at the first TMT joint, mild
to moderate in degree.

## 2020-04-02 ENCOUNTER — Other Ambulatory Visit: Payer: Self-pay | Admitting: Physician Assistant

## 2020-04-02 MED ORDER — LISINOPRIL 20 MG PO TABS: 20.0000 mg | ORAL_TABLET | Freq: Every day | ORAL | 1 refills | Status: DC

## 2020-04-02 MED ORDER — CITALOPRAM HYDROBROMIDE 20 MG PO TABS: ORAL_TABLET | ORAL | 0 refills | Status: DC

## 2020-06-03 ENCOUNTER — Encounter: Payer: Self-pay | Admitting: Physician Assistant

## 2020-06-03 ENCOUNTER — Ambulatory Visit: Payer: No Typology Code available for payment source | Admitting: Physician Assistant

## 2020-06-03 VITALS — BP 146/90 | HR 70 | Temp 98.1°F | Ht 59.0 in | Wt 155.0 lb

## 2020-06-03 DIAGNOSIS — Z131 Encounter for screening for diabetes mellitus: Secondary | ICD-10-CM

## 2020-06-03 DIAGNOSIS — F17200 Nicotine dependence, unspecified, uncomplicated: Secondary | ICD-10-CM

## 2020-06-03 DIAGNOSIS — Q742 Other congenital malformations of lower limb(s), including pelvic girdle: Secondary | ICD-10-CM

## 2020-06-03 DIAGNOSIS — I10 Essential (primary) hypertension: Secondary | ICD-10-CM

## 2020-06-03 DIAGNOSIS — R7309 Other abnormal glucose: Secondary | ICD-10-CM

## 2020-06-03 MED ORDER — AMLODIPINE BESYLATE 5 MG PO TABS
5.0000 mg | ORAL_TABLET | Freq: Every day | ORAL | 1 refills | Status: DC
Start: 2020-06-03 — End: 2020-07-16

## 2020-06-03 NOTE — Progress Notes (Signed)
BP (!) 146/90    Pulse 70    Temp 98.1 F (36.7 C)    Ht 4\' 11"  (1.499 m)    Wt 155 lb (70.3 kg)    SpO2 99%    BMI 31.31 kg/m    Subjective:    Patient ID: , female    DOB: 22-Apr-1966, 54 y.o.   MRN: [id number]  HPI: Jasmine Davies is a 54 y.o. female presenting on 06/03/2020 for Hypertension and Mental Health Problem   HPI    Pt had a negative covid 19 screening questionnaire.    Pt is 54yoF with htn and depression/anxiety.  She is seeing orthopedist for her foot and says he is planning to do surgery on it.  she checks her bp at home.  She says it Runs about 130/80.  She had her influenza and covid vaccinations.   She says she is doing pretty well today.  She says her mood is good and that the citalopram helps.     Relevant past medical, surgical, family and social history reviewed and updated as indicated. Interim medical history since our last visit reviewed. Allergies and medications reviewed and updated.   Current Outpatient Medications:    aspirin EC 81 MG tablet, Take 81 mg by mouth daily., Disp: , Rfl:    citalopram (CELEXA) 20 MG tablet, TAKE ONE (1) TABLET EACH DAY, Disp: 90 tablet, Rfl: 0   lisinopril (ZESTRIL) 20 MG tablet, Take 1 tablet (20 mg total) by mouth daily., Disp: 90 tablet, Rfl: 1   diclofenac (VOLTAREN) 75 MG EC tablet, TAKE ONE TABLET TWICE DAILY AS NEEDED (Patient not taking: Reported on 06/03/2020), Disp: 30 tablet, Rfl: 1      Review of Systems  Per HPI unless specifically indicated above     Objective:    BP (!) 146/90    Pulse 70    Temp 98.1 F (36.7 C)    Ht 4\' 11"  (1.499 m)    Wt 155 lb (70.3 kg)    SpO2 99%    BMI 31.31 kg/m   Wt Readings from Last 3 Encounters:  06/03/20 155 lb (70.3 kg)  02/29/20 150 lb (68 kg)  02/22/20 150 lb (68 kg)    Physical Exam Vitals reviewed.  Constitutional:      General: She is not in acute distress.    Appearance: She is well-developed. She is not ill-appearing.  HENT:      Head: Normocephalic and atraumatic.  Cardiovascular:     Rate and Rhythm: Normal rate and regular rhythm.  Pulmonary:     Effort: Pulmonary effort is normal.     Breath sounds: Normal breath sounds.  Abdominal:     General: Bowel sounds are normal.     Palpations: Abdomen is soft. There is no mass.     Tenderness: There is no abdominal tenderness.  Musculoskeletal:     Cervical back: Neck supple.     Right lower leg: No edema.     Left lower leg: No edema.  Lymphadenopathy:     Cervical: No cervical adenopathy.  Skin:    General: Skin is warm and dry.  Neurological:     Mental Status: She is alert and oriented to person, place, and time.  Psychiatric:        Attention and Perception: Attention normal.        Speech: Speech normal.        Behavior: Behavior normal. Behavior is cooperative.  Assessment & Plan:   1. HYPERTENSION 2. Tobacco Use   -Add amlodipine.  Continue lisinopril. -pt to Monitor bp at home -encouargaed smoking cessation -F/u 6 wk to recheck bp.   Will update labs before appointment

## 2020-07-16 ENCOUNTER — Ambulatory Visit: Payer: No Typology Code available for payment source | Admitting: Physician Assistant

## 2020-07-16 ENCOUNTER — Other Ambulatory Visit: Payer: Self-pay

## 2020-07-16 ENCOUNTER — Other Ambulatory Visit (HOSPITAL_COMMUNITY)
Admission: RE | Admit: 2020-07-16 | Discharge: 2020-07-16 | Disposition: A | Discharge status: Home / Self Care | Payer: No Typology Code available for payment source | Source: Ambulatory Visit | Attending: Physician Assistant | Admitting: Physician Assistant

## 2020-07-16 ENCOUNTER — Encounter: Payer: Self-pay | Admitting: Physician Assistant

## 2020-07-16 ENCOUNTER — Other Ambulatory Visit: Payer: Self-pay | Admitting: Student

## 2020-07-16 VITALS — BP 170/88 | HR 64 | Temp 97.1°F | Ht 59.0 in | Wt 155.6 lb

## 2020-07-16 DIAGNOSIS — Z131 Encounter for screening for diabetes mellitus: Secondary | ICD-10-CM | POA: Insufficient documentation

## 2020-07-16 DIAGNOSIS — R7309 Other abnormal glucose: Secondary | ICD-10-CM | POA: Insufficient documentation

## 2020-07-16 DIAGNOSIS — I10 Essential (primary) hypertension: Secondary | ICD-10-CM

## 2020-07-16 DIAGNOSIS — F419 Anxiety disorder, unspecified: Secondary | ICD-10-CM

## 2020-07-16 DIAGNOSIS — F17200 Nicotine dependence, unspecified, uncomplicated: Secondary | ICD-10-CM

## 2020-07-16 DIAGNOSIS — Z1231 Encounter for screening mammogram for malignant neoplasm of breast: Secondary | ICD-10-CM

## 2020-07-16 LAB — BASIC METABOLIC PANEL
Anion gap: 11 (ref 5–15)
BUN: 10 mg/dL (ref 6–20)
CO2: 24 mmol/L (ref 22–32)
Calcium: 8.6 mg/dL — ABNORMAL LOW (ref 8.9–10.3)
Chloride: 104 mmol/L (ref 98–111)
Creatinine, Ser: 0.72 mg/dL (ref 0.44–1.00)
GFR, Estimated: >60 mL/min (ref >60)
Glucose, Bld: 110 mg/dL — ABNORMAL HIGH (ref 70–99)
Potassium: 4.1 mmol/L (ref 3.5–5.1)
Sodium: 139 mmol/L (ref 135–145)

## 2020-07-16 LAB — HEMOGLOBIN A1C
Hgb A1c MFr Bld: 5.6 % (ref 4.8–5.6)
Mean Plasma Glucose: 114.02 mg/dL

## 2020-07-16 MED ORDER — METOPROLOL TARTRATE 50 MG PO TABS: 50.0000 mg | ORAL_TABLET | Freq: Two times a day (BID) | ORAL | 0 refills | Status: DC

## 2020-07-16 MED ORDER — METOPROLOL TARTRATE 50 MG PO TABS
50.0000 mg | ORAL_TABLET | Freq: Two times a day (BID) | ORAL | 0 refills | Status: DC
Start: 2020-07-16 — End: 2020-07-16

## 2020-07-16 MED ORDER — LISINOPRIL 20 MG PO TABS: 20.0000 mg | ORAL_TABLET | Freq: Every day | ORAL | 0 refills | Status: DC

## 2020-07-16 MED ORDER — CITALOPRAM HYDROBROMIDE 20 MG PO TABS: ORAL_TABLET | ORAL | 0 refills | Status: DC

## 2020-07-16 NOTE — Progress Notes (Signed)
BP (!) 170/88   Pulse 64   Temp (!) 97.1 F (36.2 C)   Ht 4\' 11"  (1.499 m)   Wt 155 lb 9.6 oz (70.6 kg)   SpO2 97%   BMI 31.43 kg/m    Subjective:    Patient ID: , female    DOB: 20-Oct-1965, 54 y.o.   MRN: [id number]  HPI: ROBERTA Davies is a 54 y.o. female presenting on 07/16/2020 for Hypertension   HPI     Pt had a negative covid 19 screening questionnaire.    Chief Complaint  Patient presents with  . Hypertension     She was started on amlodipine at last appointment.  She says the amlodipine "bottomed out her bp" to 99/51. She took it for 3 weeks.  She says it made her feel bad the entire time she took it.    She did not get her labs drawn  She says her Mood is okay on citalopram.  She has been Out of it about 3 days and she can tell.  She is Still smoking.  She is not having any CP, vision changes or HA.      Relevant past medical, surgical, family and social history reviewed and updated as indicated. Interim medical history since our last visit reviewed. Allergies and medications reviewed and updated.   Current Outpatient Medications:  .  aspirin EC 81 MG tablet, Take 81 mg by mouth daily., Disp: , Rfl:  .  lisinopril (ZESTRIL) 20 MG tablet, Take 1 tablet (20 mg total) by mouth daily., Disp: 90 tablet, Rfl: 1 .  citalopram (CELEXA) 20 MG tablet, TAKE ONE (1) TABLET EACH DAY (Patient not taking: Reported on 07/16/2020), Disp: 90 tablet, Rfl: 0    Review of Systems  Per HPI unless specifically indicated above     Objective:    BP (!) 170/88   Pulse 64   Temp (!) 97.1 F (36.2 C)   Ht 4\' 11"  (1.499 m)   Wt 155 lb 9.6 oz (70.6 kg)   SpO2 97%   BMI 31.43 kg/m   Wt Readings from Last 3 Encounters:  07/16/20 155 lb 9.6 oz (70.6 kg)  06/03/20 155 lb (70.3 kg)  02/29/20 150 lb (68 kg)    Physical Exam Vitals reviewed.  Constitutional:      General: She is not in acute distress.    Appearance: She is well-developed. She is not  ill-appearing.  HENT:     Head: Normocephalic and atraumatic.  Cardiovascular:     Rate and Rhythm: Normal rate and regular rhythm.  Pulmonary:     Effort: Pulmonary effort is normal.     Breath sounds: Normal breath sounds.  Abdominal:     General: Bowel sounds are normal.     Palpations: Abdomen is soft. There is no mass.     Tenderness: There is no abdominal tenderness.  Musculoskeletal:     Cervical back: Neck supple.     Right lower leg: No edema.     Left lower leg: No edema.  Lymphadenopathy:     Cervical: No cervical adenopathy.  Skin:    General: Skin is warm and dry.  Neurological:     Mental Status: She is alert and oriented to person, place, and time.  Psychiatric:        Attention and Perception: Attention normal.        Mood and Affect: Affect is not inappropriate.  Speech: Speech normal.        Behavior: Behavior normal. Behavior is cooperative.            Assessment & Plan:    Encounter Diagnoses  Name Primary?  . Essential hypertension, benign Yes  . Anxiety   . Tobacco use disorder      -Add metoprolol for blood pressure.  She is to Continue lisinopril.  She is encouraged to notify office if she needs to stop a bp med due to side effects (so as to prevent elevations of her bp like she has today).  She is to monitor her bp at home.  Discussed with her that she needs to pick up her medications today because the bp is too high to wait for it to be delivered by [facility].  She agrees.  -she is given ifobt for colon cancer screening  She will be referred for screening Mammogram (January) -she is to Get labs drawn -she will follow up 4 weeks to recheck bp.  She is to contact office sooner prn

## 2020-08-13 ENCOUNTER — Ambulatory Visit: Payer: No Typology Code available for payment source | Admitting: Physician Assistant

## 2020-08-14 ENCOUNTER — Encounter: Payer: Self-pay | Admitting: Physician Assistant

## 2020-08-19 ENCOUNTER — Inpatient Hospital Stay (HOSPITAL_COMMUNITY): Admission: RE | Admit: 2020-08-19 | Payer: No Typology Code available for payment source | Source: Ambulatory Visit

## 2020-08-19 ENCOUNTER — Encounter (HOSPITAL_COMMUNITY): Payer: Self-pay

## 2020-08-20 ENCOUNTER — Ambulatory Visit: Payer: No Typology Code available for payment source | Admitting: Physician Assistant

## 2020-08-20 ENCOUNTER — Other Ambulatory Visit: Payer: Self-pay | Admitting: Physician Assistant

## 2020-08-20 ENCOUNTER — Encounter: Payer: Self-pay | Admitting: Physician Assistant

## 2020-08-20 DIAGNOSIS — Z1211 Encounter for screening for malignant neoplasm of colon: Secondary | ICD-10-CM

## 2020-08-20 DIAGNOSIS — Z20822 Contact with and (suspected) exposure to covid-19: Secondary | ICD-10-CM

## 2020-08-20 DIAGNOSIS — R6889 Other general symptoms and signs: Secondary | ICD-10-CM

## 2020-08-20 DIAGNOSIS — R519 Headache, unspecified: Secondary | ICD-10-CM

## 2020-08-20 DIAGNOSIS — R0989 Other specified symptoms and signs involving the circulatory and respiratory systems: Secondary | ICD-10-CM

## 2020-08-20 DIAGNOSIS — R059 Cough, unspecified: Secondary | ICD-10-CM

## 2020-08-20 LAB — IFOBT (OCCULT BLOOD): IFOBT: NEGATIVE

## 2020-08-20 NOTE — Progress Notes (Signed)
   There were no vitals taken for this visit.   Subjective:    Patient ID: Jasmine Davies, female    DOB: 1966-01-16, 55 y.o.   MRN: [id number]  HPI: Jasmine Davies is a 55 y.o. female presenting on 08/20/2020 for No chief complaint on file.   HPI    This is a telemedicine appointment due to coronavirus pandemic.  It is via Telephone due to pt unable to connect with video.  I connected with  Jasmine Davies on 08/20/20 by a video enabled telemedicine application and verified that I am speaking with the correct person using two identifiers.   I discussed the limitations of evaluation and management by telemedicine. The patient expressed understanding and agreed to proceed.  Pt is at home.  Provider is at office.   Pt reports Cough HA runny nose and just  Feeling bad Exposure to covid last Tuesday- at [facility] emergency room(she was seen for wrist sprain) She is having No problems breathing  She Got covid vaccine x 2; last dose may. Not yet boostered       Relevant past medical, surgical, family and social history reviewed and updated as indicated. Interim medical history since our last visit reviewed. Allergies and medications reviewed and updated.   Current Outpatient Medications:  .  aspirin EC 81 MG tablet, Take 81 mg by mouth daily., Disp: , Rfl:  .  citalopram (CELEXA) 20 MG tablet, TAKE ONE (1) TABLET EACH DAY, Disp: 90 tablet, Rfl: 0 .  lisinopril (ZESTRIL) 20 MG tablet, Take 1 tablet (20 mg total) by mouth daily., Disp: 90 tablet, Rfl: 0 .  metoprolol tartrate (LOPRESSOR) 50 MG tablet, Take 1 tablet (50 mg total) by mouth 2 (two) times daily., Disp: 180 tablet, Rfl: 0    Review of Systems  Per HPI unless specifically indicated above     Objective:    There were no vitals taken for this visit.  Wt Readings from Last 3 Encounters:  07/16/20 155 lb 9.6 oz (70.6 kg)  06/03/20 155 lb (70.3 kg)  02/29/20 150 lb (68 kg)    Physical Exam Pulmonary:     Effort: No  respiratory distress.     Comments: Pt is speaking in complete sentences without dyspnea Neurological:     Mental Status: She is alert and oriented to person, place, and time.  Psychiatric:        Attention and Perception: Attention normal.        Speech: Speech normal.        Behavior: Behavior normal.            Assessment & Plan:    Encounter Diagnoses  Name Primary?  . Person under investigation for COVID-19 Yes  . Cough   . Nonintractable headache, unspecified chronicity pattern, unspecified headache type   . Runny nose   . Feeling bad       -discussed with pt need for covid test.  Discussed different options available and she would like to get it at [facility].  She is asked to notify this office of her result -she is counseled on self isolation until she is well

## 2020-09-18 ENCOUNTER — Encounter: Payer: Self-pay | Admitting: Physician Assistant

## 2020-09-18 ENCOUNTER — Ambulatory Visit: Payer: No Typology Code available for payment source | Admitting: Physician Assistant

## 2020-09-18 VITALS — BP 127/80

## 2020-09-18 DIAGNOSIS — F419 Anxiety disorder, unspecified: Secondary | ICD-10-CM

## 2020-09-18 DIAGNOSIS — Z1239 Encounter for other screening for malignant neoplasm of breast: Secondary | ICD-10-CM

## 2020-09-18 DIAGNOSIS — I10 Essential (primary) hypertension: Secondary | ICD-10-CM

## 2020-09-18 DIAGNOSIS — F17200 Nicotine dependence, unspecified, uncomplicated: Secondary | ICD-10-CM

## 2020-09-18 NOTE — Progress Notes (Signed)
There were no vitals taken for this visit.   Subjective:    Patient ID: Jasmine Davies, female    DOB: [birth year], 55 y.o.   MRN: [id number]  HPI: Jasmine Davies is a 55 y.o. female presenting on 09/18/2020 for No chief complaint on file.   HPI   This is a telemedicine appointment through [employer] due to coronavirus pandemic.  I connected with  Ivonna Kinnick Colston on 09/18/20 by a video enabled telemedicine application and verified that I am speaking with the correct person using two identifiers.   I discussed the limitations of evaluation and management by telemedicine. The patient expressed understanding and agreed to proceed.  Pt is at home.  Provider is at office.     Pt is 55yoF with HTN.    Pt has moved and reports new address: [address] [place] [address]   She is checking her bp at home.  She has checked it twice today: 141/87   127/80  She has moved recently and says it has made "all the world of difference" and she is feeling much better and is much happier.     Relevant past medical, surgical, family and social history reviewed and updated as indicated. Interim medical history since our last visit reviewed. Allergies and medications reviewed and updated.   Current Outpatient Medications:    aspirin EC 81 MG tablet, Take 81 mg by mouth daily., Disp: , Rfl:    citalopram (CELEXA) 20 MG tablet, TAKE ONE (1) TABLET EACH DAY, Disp: 90 tablet, Rfl: 0   lisinopril (ZESTRIL) 20 MG tablet, Take 1 tablet (20 mg total) by mouth daily., Disp: 90 tablet, Rfl: 0   metoprolol tartrate (LOPRESSOR) 50 MG tablet, Take 1 tablet (50 mg total) by mouth 2 (two) times daily., Disp: 180 tablet, Rfl: 0     Review of Systems  Per HPI unless specifically indicated above     Objective:    There were no vitals taken for this visit.  Wt Readings from Last 3 Encounters:  07/16/20 155 lb 9.6 oz (70.6 kg)  06/03/20 155 lb (70.3 kg)  02/29/20 150 lb (68 kg)    Physical  Exam Constitutional:      General: She is not in acute distress.    Appearance: She is not ill-appearing.  HENT:     Head: Normocephalic and atraumatic.  Pulmonary:     Effort: No respiratory distress.     Comments: Pt is talking in complete sentences without sob Neurological:     Mental Status: She is alert and oriented to person, place, and time.  Psychiatric:        Attention and Perception: Attention normal.        Mood and Affect: Mood normal.        Speech: Speech normal.        Behavior: Behavior normal. Behavior is cooperative.     Comments: Pt is smiling and converses easily.               Assessment & Plan:     Encounter Diagnoses  Name Primary?   Essential hypertension, benign Yes   Anxiety    Tobacco use disorder    Encounter for screening for malignant neoplasm of breast, unspecified screening modality      -pt to continue current medications -encouraged smoking cessation -encouraged pt to get covid booster -will refer for screening mammogram -pt to follow up 3 months.  She is to contact office sooner  prn

## 2020-09-19 ENCOUNTER — Other Ambulatory Visit: Payer: Self-pay | Admitting: Student

## 2020-09-19 DIAGNOSIS — Z1231 Encounter for screening mammogram for malignant neoplasm of breast: Secondary | ICD-10-CM

## 2020-10-07 ENCOUNTER — Ambulatory Visit (HOSPITAL_COMMUNITY)
Admission: RE | Admit: 2020-10-07 | Discharge: 2020-10-07 | Disposition: A | Discharge status: Home / Self Care | Payer: Self-pay | Source: Ambulatory Visit | Attending: Physician Assistant | Admitting: Physician Assistant

## 2020-10-07 ENCOUNTER — Other Ambulatory Visit: Payer: Self-pay

## 2020-10-07 DIAGNOSIS — Z1231 Encounter for screening mammogram for malignant neoplasm of breast: Secondary | ICD-10-CM | POA: Insufficient documentation

## 2020-10-29 ENCOUNTER — Other Ambulatory Visit: Payer: Self-pay | Admitting: Physician Assistant

## 2020-10-29 ENCOUNTER — Telehealth: Payer: Self-pay | Admitting: Physician Assistant

## 2020-10-29 NOTE — Telephone Encounter (Signed)
Called patient and told them to call [facility] to refill medications. Patient stated she was out of all medication and I told her to call the office back if she wanted [place] to send refills to [facility] until medications come from [facility].

## 2020-10-30 ENCOUNTER — Other Ambulatory Visit: Payer: Self-pay | Admitting: Physician Assistant

## 2020-10-30 MED ORDER — LISINOPRIL 20 MG PO TABS: 20.0000 mg | ORAL_TABLET | Freq: Every day | ORAL | 0 refills | Status: DC

## 2020-10-30 MED ORDER — CITALOPRAM HYDROBROMIDE 20 MG PO TABS: ORAL_TABLET | ORAL | 0 refills | Status: DC

## 2020-10-30 MED ORDER — METOPROLOL TARTRATE 50 MG PO TABS: 50.0000 mg | ORAL_TABLET | Freq: Two times a day (BID) | ORAL | 0 refills | Status: DC

## 2020-11-25 ENCOUNTER — Other Ambulatory Visit: Payer: Self-pay | Admitting: Physician Assistant

## 2020-11-25 DIAGNOSIS — I10 Essential (primary) hypertension: Secondary | ICD-10-CM

## 2020-12-10 ENCOUNTER — Ambulatory Visit: Payer: No Typology Code available for payment source | Admitting: Physician Assistant

## 2020-12-26 ENCOUNTER — Encounter: Payer: Self-pay | Admitting: Physician Assistant

## 2021-10-06 ENCOUNTER — Ambulatory Visit
Admission: EM | Admit: 2021-10-06 | Discharge: 2021-10-06 | Disposition: A | Discharge status: Home / Self Care | Payer: 59 | Attending: Urgent Care | Admitting: Urgent Care

## 2021-10-06 ENCOUNTER — Other Ambulatory Visit: Payer: Self-pay

## 2021-10-06 DIAGNOSIS — F17200 Nicotine dependence, unspecified, uncomplicated: Secondary | ICD-10-CM

## 2021-10-06 DIAGNOSIS — R062 Wheezing: Secondary | ICD-10-CM

## 2021-10-06 DIAGNOSIS — R030 Elevated blood-pressure reading, without diagnosis of hypertension: Secondary | ICD-10-CM

## 2021-10-06 DIAGNOSIS — J069 Acute upper respiratory infection, unspecified: Secondary | ICD-10-CM | POA: Diagnosis not present

## 2021-10-06 DIAGNOSIS — I10 Essential (primary) hypertension: Secondary | ICD-10-CM | POA: Diagnosis not present

## 2021-10-06 DIAGNOSIS — Z8673 Personal history of transient ischemic attack (TIA), and cerebral infarction without residual deficits: Secondary | ICD-10-CM

## 2021-10-06 MED ORDER — PREDNISONE 20 MG PO TABS: ORAL_TABLET | ORAL | 0 refills | Status: DC

## 2021-10-06 MED ORDER — ALBUTEROL SULFATE HFA 108 (90 BASE) MCG/ACT IN AERS: 1.0000 | INHALATION_SPRAY | Freq: Four times a day (QID) | RESPIRATORY_TRACT | 0 refills | Status: DC | PRN

## 2021-10-06 MED ORDER — LISINOPRIL 20 MG PO TABS: 20.0000 mg | ORAL_TABLET | Freq: Every day | ORAL | 0 refills | Status: DC

## 2021-10-06 MED ORDER — PROMETHAZINE-DM 6.25-15 MG/5ML PO SYRP: 5.0000 mL | ORAL_SOLUTION | Freq: Four times a day (QID) | ORAL | 0 refills | Status: DC | PRN

## 2021-10-06 NOTE — ED Provider Notes (Signed)
[facility]   MRN: [id number] DOB: 04-25-1966  Subjective:   Jasmine Davies is a 56 y.o. female presenting for 4-day history of acute onset coughing, productive cough, runny and stuffy nose, malaise and fatigue, wheezing.  Patient has longstanding history of smoking, greater than 25-pack-year history.  Has never been diagnosed with asthma or COPD.  She also has a history of hypertension and is on her medications currently.  She used to take lisinopril.  No chest pain, body aches, confusion, weakness, numbness or tingling, neck pain, neck stiffness.  Would like to be checked for COVID and flu.  No current facility-administered medications for this encounter.  Current Outpatient Medications:    aspirin EC 81 MG tablet, Take 81 mg by mouth daily., Disp: , Rfl:    citalopram (CELEXA) 20 MG tablet, TAKE 1 Tablet BY MOUTH ONCE EVERY DAY, Disp: 90 tablet, Rfl: 0   citalopram (CELEXA) 20 MG tablet, TAKE ONE (1) TABLET EACH DAY, Disp: 30 tablet, Rfl: 0   lisinopril (ZESTRIL) 20 MG tablet, TAKE 1 Tablet BY MOUTH ONCE EVERY DAY, Disp: 90 tablet, Rfl: 0   lisinopril (ZESTRIL) 20 MG tablet, Take 1 tablet (20 mg total) by mouth daily., Disp: 30 tablet, Rfl: 0   metoprolol tartrate (LOPRESSOR) 50 MG tablet, Take 1 tablet (50 mg total) by mouth 2 (two) times daily., Disp: 180 tablet, Rfl: 0   Allergies  Allergen Reactions   Penicillins Rash    Past Medical History:  Diagnosis Date   Chronic back pain    Chronic pain in left foot    Cleft foot L foot   Degenerative joint disease    Depression 2003   Hernia, hiatal    Hiatal hernia    Hypertension    PUD (peptic ulcer disease)    ? ETOH and ASA use   Sciatica      Past Surgical History:  Procedure Laterality Date   CESAREAN SECTION     x2   CHOLECYSTECTOMY  1993   CLUB FOOT RELEASE     left foot   ESOPHAGOGASTRODUODENOSCOPY  10/06/2011   ulcers,multiple in antrum and duodenitis/hiatal hernia   ESOPHAGOGASTRODUODENOSCOPY   12/15/2011   SLF: Moderate gastritis/ Hiatal hernia/PUD IMPROVED BUT NOT RESOLVED DUE TO ONGOING ASA USE   TUBAL LIGATION  1999    Family History  Problem Relation Age of Onset   Hypertension Mother    Heart failure Father        CHF   Alcohol abuse Father    Cancer Father        Stomach Cancer   Colon cancer Neg Hx     Social History   Tobacco Use   Smoking status: Every Day    Packs/day: 0.50    Years: 36.00    Pack years: 18.00    Types: Cigarettes   Smokeless tobacco: Never  Vaping Use   Vaping Use: Never used  Substance Use Topics   Alcohol use: Not Currently    Alcohol/week: 67.0 standard drinks    Types: 67 Cans of beer per week    Comment: 40 oz beer every other day.  none since 07/10/19   Drug use: Not Currently    Types: Marijuana    ROS   Objective:   Vitals: BP (!) 195/95 (BP Location: Right Arm)    Pulse 93    Temp 98.6 F (37 C) (Oral)    Resp 20    SpO2 94%   BP Readings from Last  3 Encounters:  10/06/21 (!) 195/95  09/18/20 127/80  07/16/20 (!) 170/88   BP recheck was 173/100.   Physical Exam Constitutional:      General: She is not in acute distress.    Appearance: Normal appearance. She is well-developed and normal weight. She is not ill-appearing, toxic-appearing or diaphoretic.  HENT:     Head: Normocephalic and atraumatic.     Right Ear: Tympanic membrane, ear canal and external ear normal. No drainage or tenderness. No middle ear effusion. There is no impacted cerumen. Tympanic membrane is not erythematous.     Left Ear: Tympanic membrane, ear canal and external ear normal. No drainage or tenderness.  No middle ear effusion. There is no impacted cerumen. Tympanic membrane is not erythematous.     Nose: Congestion and rhinorrhea present.     Mouth/Throat:     Mouth: Mucous membranes are moist. No oral lesions.     Pharynx: No pharyngeal swelling, oropharyngeal exudate, posterior oropharyngeal erythema or uvula swelling.     Tonsils:  No tonsillar exudate or tonsillar abscesses.  Eyes:     General: No scleral icterus.       Right eye: No discharge.        Left eye: No discharge.     Extraocular Movements: Extraocular movements intact.     Right eye: Normal extraocular motion.     Left eye: Normal extraocular motion.     Conjunctiva/sclera: Conjunctivae normal.  Cardiovascular:     Rate and Rhythm: Normal rate.     Heart sounds: No murmur heard.   No friction rub. No gallop.  Pulmonary:     Effort: Pulmonary effort is normal. No respiratory distress.     Breath sounds: No stridor. Wheezing (mild throughout) present. No rhonchi or rales.  Chest:     Chest wall: No tenderness.  Musculoskeletal:     Cervical back: Normal range of motion and neck supple.  Lymphadenopathy:     Cervical: No cervical adenopathy.  Skin:    General: Skin is warm and dry.  Neurological:     General: No focal deficit present.     Mental Status: She is alert and oriented to person, place, and time.     Cranial Nerves: No cranial nerve deficit, dysarthria or facial asymmetry.     Motor: No weakness.     Coordination: Romberg sign negative. Coordination normal.     Gait: Gait normal.     Comments: Negative Romberg and pronator drift.  Psychiatric:        Mood and Affect: Mood normal.        Behavior: Behavior normal.        Thought Content: Thought content normal.        Judgment: Judgment normal.    Assessment and Plan :   PDMP not reviewed this encounter.  1. Viral URI with cough   2. Wheezing   3. Smoker   4. Essential hypertension   5. Elevated blood pressure reading   6. History of transient ischemic attack (TIA)    Deferred imaging given clear cardiopulmonary exam, hemodynamically stable vital signs.  I refilled her albuterol inhaler.  Recommended an oral prednisone course given her extensive history of smoking, wheezing on lung exam.  COVID and flu tests are pending.  As there were no sounds of rales, clinical deferred  imaging.  I started her on lisinopril again to help with her blood pressure especially since were using a prednisone course.  Advised that she  make sure she follows up with PCP as soon as possible. Counseled patient on potential for adverse effects with medications prescribed/recommended today, ER and return-to-clinic precautions discussed, patient verbalized understanding.    Wallis Bamberg, [place] 10/06/21 [phone]

## 2021-10-06 NOTE — Discharge Instructions (Addendum)
We will notify you of your test results as they arrive and may take between 48-72 hours.  I encourage you to sign up for MyChart if you have not already done so as this can be the easiest way for [place] to communicate results to you online or through a phone app.  Generally, we only contact you if it is a positive test result.  In the meantime, if you develop worsening symptoms including fever, chest pain, shortness of breath despite our current treatment plan then please report to the emergency room as this may be a sign of worsening status from possible viral infection.  Otherwise, we will manage this as a viral syndrome. For sore throat or cough try using a honey-based tea. Use 3 teaspoons of honey with juice squeezed from half lemon. Place shaved pieces of ginger into 1/2-1 cup of water and warm over stove top. Then mix the ingredients and repeat every 4 hours as needed. Please take Tylenol 500mg -650mg  every 6 hours for aches and pains, fevers. Hydrate very well with at least 2 liters of water. Eat light meals such as soups to replenish electrolytes and soft fruits, veggies. Start an antihistamine like Zyrtec for postnasal drainage, sinus congestion.  You can take this together with prednisone and the inhaler. Use the cough medication as needed.   Start taking lisinopril again for you high blood pressure and follow up with your PCP again as soon as possible.   For diabetes or elevated blood sugar, please make sure you are limiting and avoiding starchy, carbohydrate foods like pasta, breads, sweet breads, pastry, rice, potatoes, desserts. These foods can elevate your blood sugar. Also, limit and avoid drinks that contain a lot of sugar such as sodas, sweet teas, fruit juices.  Drinking plain water will be much more helpful, try 64 ounces of water daily.  It is okay to flavor your water naturally by cutting cucumber, lemon, mint or lime, placing it in a picture with water and drinking it over a period of 24-48  hours as long as it remains refrigerated.  For elevated blood pressure, make sure you are monitoring salt in your diet.  Do not eat restaurant foods and limit processed foods at home. I highly recommend you prepare and cook your own foods at home.  Processed foods include things like frozen meals, pre-seasoned meats and dinners, deli meats, canned foods as these foods contain a high amount of sodium/salt.  Make sure you are paying attention to sodium labels on foods you buy at the grocery store. Buy your spices separately such as garlic powder, onion powder, cumin, cayenne, parsley flakes so that you can avoid seasonings that contain salt. However, salt-free seasonings are available and can be used, an example is Mrs. Dash and includes a lot of different mixtures that do not contain salt.  Lastly, when cooking using oils that are healthier for you is important. This includes olive oil, avocado oil, canola oil. We have discussed a lot of foods to avoid but below is a list of foods that can be very healthy to use in your diet whether it is for diabetes, cholesterol, high blood pressure, or in general healthy eating.  Salads - kale, spinach, cabbage, spring mix, arugula Fruits - avocadoes, berries (blueberries, raspberries, blackberries), apples, oranges, pomegranate, grapefruit, kiwi Vegetables - asparagus, cauliflower, broccoli, green beans, brussel sprouts, bell peppers, beets; stay away from or limit starchy vegetables like potatoes, carrots, peas Other general foods - kidney beans, egg whites, almonds, walnuts,  sunflower seeds, pumpkin seeds, fat free yogurt, almond milk, flax seeds, quinoa, oats  Meat - It is better to eat lean meats and limit your red meat including pork to once a week.  Wild caught fish, chicken breast are good options as they tend to be leaner sources of good protein. Still be mindful of the sodium labels for the meats you buy.  DO NOT EAT ANY FOODS ON THIS LIST THAT YOU ARE  ALLERGIC TO. For more specific needs, I highly recommend consulting a dietician or nutritionist but this can definitely be a good starting point.

## 2021-10-06 NOTE — ED Triage Notes (Signed)
Pt reports headache, cough, chest pain when coughing, nasal congestion and shortness of breath x 4 days.

## 2021-10-07 LAB — COVID-19, FLU A+B NAA
Influenza A, NAA: NOT DETECTED
Influenza B, NAA: NOT DETECTED
SARS-CoV-2, NAA: NOT DETECTED

## 2022-06-03 ENCOUNTER — Emergency Department (HOSPITAL_COMMUNITY)
Admission: EM | Admit: 2022-06-03 | Discharge: 2022-06-03 | Disposition: A | Discharge status: Home / Self Care | Payer: No Typology Code available for payment source | Attending: Emergency Medicine | Admitting: Emergency Medicine

## 2022-06-03 ENCOUNTER — Emergency Department (HOSPITAL_COMMUNITY): Payer: No Typology Code available for payment source

## 2022-06-03 ENCOUNTER — Encounter (HOSPITAL_COMMUNITY): Payer: Self-pay | Admitting: Emergency Medicine

## 2022-06-03 DIAGNOSIS — J441 Chronic obstructive pulmonary disease with (acute) exacerbation: Secondary | ICD-10-CM | POA: Insufficient documentation

## 2022-06-03 DIAGNOSIS — Z7982 Long term (current) use of aspirin: Secondary | ICD-10-CM | POA: Insufficient documentation

## 2022-06-03 DIAGNOSIS — Z7951 Long term (current) use of inhaled steroids: Secondary | ICD-10-CM | POA: Insufficient documentation

## 2022-06-03 DIAGNOSIS — I10 Essential (primary) hypertension: Secondary | ICD-10-CM | POA: Insufficient documentation

## 2022-06-03 DIAGNOSIS — Z79899 Other long term (current) drug therapy: Secondary | ICD-10-CM | POA: Insufficient documentation

## 2022-06-03 LAB — CBC
HCT: 43.2 % (ref 36.0–46.0)
Hemoglobin: 14.7 g/dL (ref 12.0–15.0)
MCH: 34.2 pg — ABNORMAL HIGH (ref 26.0–34.0)
MCHC: 34 g/dL (ref 30.0–36.0)
MCV: 100.5 fL — ABNORMAL HIGH (ref 80.0–100.0)
Platelets: 298 10*3/uL (ref 150–400)
RBC: 4.3 MIL/uL (ref 3.87–5.11)
RDW: 12.9 % (ref 11.5–15.5)
WBC: 8.1 10*3/uL (ref 4.0–10.5)
nRBC: 0 % (ref 0.0–0.2)

## 2022-06-03 LAB — BASIC METABOLIC PANEL
Anion gap: 9 (ref 5–15)
BUN: 10 mg/dL (ref 6–20)
CO2: 27 mmol/L (ref 22–32)
Calcium: 8.5 mg/dL — ABNORMAL LOW (ref 8.9–10.3)
Chloride: 106 mmol/L (ref 98–111)
Creatinine, Ser: 0.58 mg/dL (ref 0.44–1.00)
GFR, Estimated: >60 mL/min (ref >60)
Glucose, Bld: 98 mg/dL (ref 70–99)
Potassium: 3.6 mmol/L (ref 3.5–5.1)
Sodium: 142 mmol/L (ref 135–145)

## 2022-06-03 MED ORDER — IPRATROPIUM-ALBUTEROL 0.5-2.5 (3) MG/3ML IN SOLN
3.0000 mL | Freq: Once | RESPIRATORY_TRACT | Status: AC
  Administered 2022-06-03: 3 mL via RESPIRATORY_TRACT
  Filled 2022-06-03: qty 3

## 2022-06-03 MED ORDER — METOPROLOL TARTRATE 50 MG PO TABS: 50.0000 mg | ORAL_TABLET | Freq: Two times a day (BID) | ORAL | 2 refills | Status: DC

## 2022-06-03 MED ORDER — ALBUTEROL SULFATE HFA 108 (90 BASE) MCG/ACT IN AERS
2.0000 | INHALATION_SPRAY | Freq: Once | RESPIRATORY_TRACT | Status: AC
  Administered 2022-06-03: 2 via RESPIRATORY_TRACT
  Filled 2022-06-03: qty 6.7

## 2022-06-03 MED ORDER — PREDNISONE 50 MG PO TABS: ORAL_TABLET | ORAL | 0 refills | Status: DC

## 2022-06-03 MED ORDER — ACETAMINOPHEN 325 MG PO TABS
650.0000 mg | ORAL_TABLET | Freq: Once | ORAL | Status: AC
  Administered 2022-06-03: 650 mg via ORAL
  Filled 2022-06-03: qty 2

## 2022-06-03 NOTE — ED Provider Notes (Signed)
[facility] EMERGENCY DEPARTMENT Provider Note   CSN: [id number] Arrival date & time: 06/03/22  1751     History  Chief Complaint  Patient presents with   Shortness of Breath    Jasmine Davies is a 56 y.o. female.  The history is provided by the patient.  Patient presents for shortness of breath.  Patient reports longtime history of progressively worsening shortness of breath.  Over the past day she had increasing cough and shortness of breath and wheezing.  She reports productive cough without any hemoptysis.  No active chest pain.  No fevers.  No lower extremity edema.  She reports dyspnea on exertion.  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  She was given nebulized therapies and Solu-Medrol and began to improve. Patient is a current smoker.  She reports difficulty obtaining her medications due to cost     Home Medications Prior to Admission medications   Medication Sig Start Date End Date Taking? Authorizing Provider  metoprolol tartrate (LOPRESSOR) 50 MG tablet Take 1 tablet (50 mg total) by mouth 2 (two) times daily. 06/03/22  Yes Zadie Rhine, MD  predniSONE (DELTASONE) 50 MG tablet 1 tablet PO QD X4 days 06/03/22  Yes Zadie Rhine, MD  albuterol (VENTOLIN HFA) 108 (90 Base) MCG/ACT inhaler Inhale 1-2 puffs into the lungs every 6 (six) hours as needed for wheezing or shortness of breath. 10/06/21   Wallis Bamberg, PA-C  aspirin EC 81 MG tablet Take 81 mg by mouth daily.    [provider]  citalopram (CELEXA) 20 MG tablet TAKE 1 Tablet BY MOUTH ONCE EVERY DAY 10/30/20   Jacquelin Hawking, PA-C  citalopram (CELEXA) 20 MG tablet TAKE ONE (1) TABLET EACH DAY 10/30/20   Jacquelin Hawking, PA-C  lisinopril (ZESTRIL) 20 MG tablet Take 1 tablet (20 mg total) by mouth daily. 10/06/21   Wallis Bamberg, PA-C  metoprolol tartrate (LOPRESSOR) 50 MG tablet Take 1 tablet (50 mg total) by  mouth 2 (two) times daily. 10/30/20   Jacquelin Hawking, PA-C      Allergies    Penicillins    Review of Systems   Review of Systems  Constitutional:  Negative for fever.  Respiratory:  Positive for cough and shortness of breath.   Cardiovascular:  Negative for chest pain and leg swelling.  Gastrointestinal:  Negative for vomiting.    Physical Exam Updated Vital Signs BP (!) 166/64   Pulse 83   Temp 97.6 F (36.4 C) (Oral)   Resp (!) 21   Ht 1.499 m (4\' 11" )   Wt 54 kg   SpO2 94%   BMI 24.04 kg/m  Physical Exam CONSTITUTIONAL: Chronically ill-appearing, appears older than stated age HEAD: Normocephalic/atraumatic EYES: EOMI/PERRL ENMT: Mucous membranes moist, poor dentition NECK: supple no meningeal signs SPINE/BACK:entire spine nontender CV: S1/S2 noted, no murmurs/rubs/gallops noted LUNGS: Decreased breath sounds bilaterally, tachypnea noted ABDOMEN: soft, nontender NEURO: Pt is awake/alert/appropriate, moves all extremitiesx4.  No facial droop.   EXTREMITIES: pulses normal/equal, full ROM, no lower extremity edema or calf tenderness SKIN: warm, color normal PSYCH: no abnormalities of mood noted, alert and oriented to situation  ED Results / Procedures / Treatments   Labs (all labs ordered are listed, but only abnormal results are displayed) Labs Reviewed  CBC - Abnormal; Notable for the following components:      Result Value   MCV 100.5 (*)  MCH 34.2 (*)    All other components within normal limits  BASIC METABOLIC PANEL - Abnormal; Notable for the following components:   Calcium 8.5 (*)    All other components within normal limits    EKG EKG Interpretation  Date/Time:  Wednesday June 03 2022 05:33:43 EDT Ventricular Rate:  90 PR Interval:  128 QRS Duration: 78 QT Interval:  387 QTC Calculation: 474 R Axis:   70 Text Interpretation: Sinus rhythm Interpretation limited secondary to artifact No significant change since last tracing Confirmed by  Zadie Rhine ([id number]) on 06/03/2022 5:37:04 AM  Radiology DG Chest Port 1 View  Result Date: 06/03/2022 CLINICAL DATA:  56 year old female with shortness of breath. EXAM: PORTABLE CHEST 1 VIEW COMPARISON:  Portable chest 06/30/2019 and earlier. FINDINGS: Portable AP upright view at 0543 hours. Normal lung volumes and mediastinal contours. Visualized tracheal air column is within normal limits. Allowing for portable technique the lungs are clear. No pneumothorax or pleural effusion. Progressed circumscribed lucent and sclerotic changes at the right proximal humerus, greater tuberosity/rotator cuff insertion site appears to be degenerative. No acute osseous abnormality identified. Negative visible bowel gas. IMPRESSION: 1.  Negative portable chest. 2. Progressive mixed lucent and sclerotic changes at the right humerus are favored to be degenerative. Electronically Signed   By: Odessa Fleming M.D.   On: 06/03/2022 05:56    Procedures Procedures    Medications Ordered in ED Medications  albuterol (VENTOLIN HFA) 108 (90 Base) MCG/ACT inhaler 2 puff (has no administration in time range)  ipratropium-albuterol (DUONEB) 0.5-2.5 (3) MG/3ML nebulizer solution 3 mL (3 mLs Nebulization Given 06/03/22 0600)  acetaminophen (TYLENOL) tablet 650 mg (650 mg Oral Given 06/03/22 3536)    ED Course/ Medical Decision Making/ A&P Clinical Course as of 06/03/22 0701  Wed Jun 03, 2022  0605 Patient presents with increasing shortness of breath and wheezing.  EMS reports she was hypoxic but this is improved with treatments.  Patient likely has undiagnosed/poorly treated COPD.  She reports difficulty obtaining medications and seeing PCP and has not been on her blood pressure medicine for over a year.  Previous medications were found, have been sent to her pharmacy [DW]  0700 Patient feeling improved.  She ambulated without any difficulty.  No hypoxia.  She will be discharged home.  She has been referred back to her PCP.   She will be given albuterol and prednisone at discharge.  We will also prescribe her home BP meds. [DW]    Clinical Course User Index [DW] Zadie Rhine, MD                           Medical Decision Making Amount and/or Complexity of Data Reviewed Labs: ordered. Radiology: ordered.  Risk OTC drugs. Prescription drug management.   This patient presents to the ED for concern of shortness of breath, this involves an extensive number of treatment options, and is a complaint that carries with it a high risk of complications and morbidity.  The differential diagnosis includes but is not limited to Acute coronary syndrome, pneumonia, acute pulmonary edema, pneumothorax, acute anemia, pulmonary embolism COPD exacerbation  Comorbidities that complicate the patient evaluation: Patient's presentation is complicated by their history of hypertension  Social Determinants of Health: Patient's lack of prescription access and impaired access to primary care  increases the complexity of managing their presentation  Additional history obtained: Records reviewed Primary Care Documents  Lab Tests: I Ordered, and personally interpreted labs.  The pertinent results include: Labs reassuring  Imaging Studies ordered: I ordered imaging studies including X-ray chest   I independently visualized and interpreted imaging which showed no acute findings I agree with the radiologist interpretation  Cardiac Monitoring: The patient was maintained on a cardiac monitor.  I personally viewed and interpreted the cardiac monitor which showed an underlying rhythm of:  sinus rhythm  Medicines ordered and prescription drug management: I ordered medication including albuterol for shortness of breath Reevaluation of the patient after these medicines showed that the patient    improved   Critical Interventions:  Nebulizers therapy  Reevaluation: After the interventions noted above, I reevaluated the patient  and found that they have :improved  Complexity of problems addressed: Patient's presentation is most consistent with  acute presentation with potential threat to life or bodily function  Disposition: After consideration of the diagnostic results and the patient's response to treatment,  I feel that the patent would benefit from discharge   .           Final Clinical Impression(s) / ED Diagnoses Final diagnoses:  COPD exacerbation ([place])  Uncontrolled hypertension    Rx / DC Orders ED Discharge Orders          Ordered    predniSONE (DELTASONE) 50 MG tablet        06/03/22 0600    metoprolol tartrate (LOPRESSOR) 50 MG tablet  2 times daily        06/03/22 0600              Ripley Fraise, MD 06/03/22 [phone]

## 2022-06-03 NOTE — Progress Notes (Signed)
Patient stated SOB has been going on "for some while".  Patient denied COPD diagnosis, denied asthma diagnosis.  Patient stated that she does smoke about 1/2 pk per day.  Patient is 96% on RA with expiratory wheeze throughout.  Patient has a congested cough.

## 2022-06-03 NOTE — ED Triage Notes (Signed)
Pt BIB [facility] from local hotel. Pt c/o SOB on and off for the past year. Pt was 87% on RA for EMS. Pt given 125mg  solumedrol IV and 2.5 albuterol by EMS. Pt arrives 100% on RA. Pt states [facility] but [facility]'t [facility] in a while. Pt states she has been out of her HTN meds for about 1 year.

## 2022-06-03 NOTE — ED Notes (Signed)
Pt ambulated around nursing station  O2 95-100% Pt states she feels much better and is ready to go

## 2022-08-21 ENCOUNTER — Emergency Department (HOSPITAL_COMMUNITY): Payer: Commercial Managed Care - HMO

## 2022-08-21 ENCOUNTER — Emergency Department (HOSPITAL_COMMUNITY)
Admission: EM | Admit: 2022-08-21 | Discharge: 2022-08-21 | Disposition: A | Discharge status: Home / Self Care | Payer: Commercial Managed Care - HMO | Attending: Emergency Medicine | Admitting: Emergency Medicine

## 2022-08-21 DIAGNOSIS — R0602 Shortness of breath: Secondary | ICD-10-CM | POA: Diagnosis not present

## 2022-08-21 DIAGNOSIS — F10220 Alcohol dependence with intoxication, uncomplicated: Secondary | ICD-10-CM | POA: Insufficient documentation

## 2022-08-21 DIAGNOSIS — Z7982 Long term (current) use of aspirin: Secondary | ICD-10-CM | POA: Insufficient documentation

## 2022-08-21 DIAGNOSIS — J449 Chronic obstructive pulmonary disease, unspecified: Secondary | ICD-10-CM | POA: Diagnosis not present

## 2022-08-21 DIAGNOSIS — Z1152 Encounter for screening for COVID-19: Secondary | ICD-10-CM | POA: Diagnosis not present

## 2022-08-21 DIAGNOSIS — F10920 Alcohol use, unspecified with intoxication, uncomplicated: Secondary | ICD-10-CM

## 2022-08-21 LAB — RESP PANEL BY RT-PCR (RSV, FLU A&B, COVID)  RVPGX2
Influenza A by PCR: NEGATIVE
Influenza B by PCR: NEGATIVE
Resp Syncytial Virus by PCR: NEGATIVE
SARS Coronavirus 2 by RT PCR: NEGATIVE

## 2022-08-21 MED ORDER — ALBUTEROL SULFATE HFA 108 (90 BASE) MCG/ACT IN AERS: 1.0000 | INHALATION_SPRAY | Freq: Four times a day (QID) | RESPIRATORY_TRACT | 0 refills | Status: DC | PRN

## 2022-08-21 NOTE — ED Triage Notes (Signed)
Pt to ED c/o SHOB x 3 days, HX COPD, current smoker, reports cough, pt intoxicated in triager, Visitor reports pt has a drinking problem

## 2022-08-21 NOTE — Discharge Instructions (Signed)
Please reduce the amount of alcohol that you drink.  Make sure you take all of your home medications exactly as prescribed, use the inhalers that you have been prescribed in the past, if you need another 1 I have sent a refill to your pharmacy  ER for severe worsening symptoms

## 2022-08-21 NOTE — ED Provider Notes (Signed)
[facility] EMERGENCY DEPARTMENT Provider Note   CSN: [id number] Arrival date & time: 08/21/22  2102     History  Chief Complaint  Patient presents with   Alcohol Intoxication   Shortness of Breath    Jasmine Davies is a 57 y.o. female.   Alcohol Intoxication Associated symptoms include shortness of breath.  Shortness of Breath  This patient is a 57 year old female presents to the hospital actively intoxicated with alcohol, she endorses drinking multiple large beers per day, her neighbor who is the primary historian and the person that brought her states that she does this every day and usually feels short of breath, she has COPD and is a current every day smoker.  The patient reports that she was short of breath today, she states that she occasionally coughs chronically but denies any swelling of her legs and denies fevers or chills.  The neighbor states that she looks like her normal self and nothing is different today.    Home Medications Prior to Admission medications   Medication Sig Start Date End Date Taking? Authorizing Provider  albuterol (VENTOLIN HFA) 108 (90 Base) MCG/ACT inhaler Inhale 1-2 puffs into the lungs every 6 (six) hours as needed for wheezing or shortness of breath. 08/21/22   Noemi Chapel, MD  aspirin EC 81 MG tablet Take 81 mg by mouth daily.    [provider]  citalopram (CELEXA) 20 MG tablet TAKE 1 Tablet BY MOUTH ONCE EVERY DAY 10/30/20   Soyla Dryer, PA-C  citalopram (CELEXA) 20 MG tablet TAKE ONE (1) TABLET EACH DAY 10/30/20   Soyla Dryer, PA-C  lisinopril (ZESTRIL) 20 MG tablet Take 1 tablet (20 mg total) by mouth daily. 10/06/21   Jaynee Eagles, PA-C  metoprolol tartrate (LOPRESSOR) 50 MG tablet Take 1 tablet (50 mg total) by mouth 2 (two) times daily. 10/30/20   Soyla Dryer, PA-C  metoprolol tartrate (LOPRESSOR) 50 MG tablet Take 1 tablet (50 mg total) by mouth 2 (two) times daily. 06/03/22   Ripley Fraise, MD  predniSONE  (DELTASONE) 50 MG tablet 1 tablet PO QD X4 days 06/03/22   Ripley Fraise, MD      Allergies    Penicillins    Review of Systems   Review of Systems  Respiratory:  Positive for shortness of breath.   All other systems reviewed and are negative.   Physical Exam Updated Vital Signs BP (!) 168/157   Pulse 88   Temp 98.5 F (36.9 C)   Resp 20   Ht 1.499 m (4\' 11" )   Wt 58.5 kg   SpO2 96%   BMI 26.05 kg/m  Physical Exam Vitals and nursing note reviewed.  Constitutional:      General: She is not in acute distress.    Appearance: She is well-developed.  HENT:     Head: Normocephalic and atraumatic.     Mouth/Throat:     Pharynx: No oropharyngeal exudate.  Eyes:     General: No scleral icterus.       Right eye: No discharge.        Left eye: No discharge.     Conjunctiva/sclera: Conjunctivae normal.     Pupils: Pupils are equal, round, and reactive to light.  Neck:     Thyroid: No thyromegaly.     Vascular: No JVD.  Cardiovascular:     Rate and Rhythm: Normal rate and regular rhythm.     Heart sounds: Normal heart sounds. No murmur heard.    No  friction rub. No gallop.  Pulmonary:     Effort: Pulmonary effort is normal. No respiratory distress.     Breath sounds: Wheezing present. No rales.     Comments: Minimal scattered expiratory wheezing but no tachycardia, no rales, no increased work of breathing Abdominal:     General: Bowel sounds are normal. There is no distension.     Palpations: Abdomen is soft. There is no mass.     Tenderness: There is no abdominal tenderness.  Musculoskeletal:        General: No tenderness. Normal range of motion.     Cervical back: Normal range of motion and neck supple.     Right lower leg: No tenderness. No edema.     Left lower leg: No tenderness. No edema.  Lymphadenopathy:     Cervical: No cervical adenopathy.  Skin:    General: Skin is warm and dry.     Findings: No erythema or rash.  Neurological:     Mental Status: She  is alert.     Coordination: Coordination normal.     Comments: The patient is overall well-appearing, happy, slurs her words but follows commands  Psychiatric:        Behavior: Behavior normal.     ED Results / Procedures / Treatments   Labs (all labs ordered are listed, but only abnormal results are displayed) Labs Reviewed  RESP PANEL BY RT-PCR (RSV, FLU A&B, COVID)  [id number]    EKG None  Radiology DG Chest 2 View  Result Date: 08/21/2022 CLINICAL DATA:  Short of breath, intoxication EXAM: CHEST - 2 VIEW COMPARISON:  06/03/2022 FINDINGS: Frontal and lateral views of the chest demonstrate an unremarkable cardiac silhouette. No airspace disease, effusion, or pneumothorax. No acute bony abnormality. IMPRESSION: 1. No acute intrathoracic process. Electronically Signed   By: Sharlet Salina M.D.   On: 08/21/2022 21:42    Procedures Procedures    Medications Ordered in ED Medications - No data to display  ED Course/ Medical Decision Making/ A&P                             Medical Decision Making Amount and/or Complexity of Data Reviewed Radiology: ordered.  Risk Prescription drug management.   This patient presents to the ED for concern of shortness of breath and alcohol intoxication, this involves an extensive number of treatment options, and is a complaint that carries with it a high risk of complications and morbidity.  The differential diagnosis includes possible pneumonia pneumothorax COVID or influenza, RSV, could be related to COPD exacerbation.  Actively intoxicated which seems to be a chronic problem for this patient   Co morbidities that complicate the patient evaluation  Chronic tobacco use history with COPD   Additional history obtained:  Additional history obtained from electronic medical record External records from outside source obtained and reviewed including multiple visits to the office for hypertension, most recently seen about 1 year ago for  hypertension, patient has had ER visits for COPD exacerbations within the last several months.  No recent admissions to the hospital since had a possible TIA about 4 years ago   Lab Tests:  I Ordered, and personally interpreted labs.  The pertinent results include: COVID and flu negative   Imaging Studies ordered:  I ordered imaging studies including chest x-ray I independently visualized and interpreted imaging which showed clear with no acute infiltrates pneumothorax or other abnormalities I agree with the  radiologist interpretation   Cardiac Monitoring: / EKG:  The patient was maintained on a cardiac monitor.  I personally viewed and interpreted the cardiac monitored which showed an underlying rhythm of: Clinically the patient is in normal sinus rhythm   Problem List / ED Course / Critical interventions / Medication management  This patient became quite cantankerous and wanted to go home, I talked to her about getting further test but she has declined.  Labs were canceled the patient was given discharge paperwork.  She does not have any hypoxia tachycardia hypotension or fever.  She appears well in no distress, encouraged the patient to reduce the amount of alcohol that she drinks I ordered medication including albuterol inhaler refill for home  Social Determinants of Health:  Alcohol abuse   Test / Admission - Considered:  Considered admission but patient is refusing further evaluation and wants to go home, at this time she does appear overall stable and understands that she may return should she change her mind         Final Clinical Impression(s) / ED Diagnoses Final diagnoses:  Alcoholic intoxication without complication ([place])  Shortness of breath    Rx / DC Orders ED Discharge Orders          Ordered    albuterol (VENTOLIN HFA) 108 (90 Base) MCG/ACT inhaler  Every 6 hours PRN        08/21/22 2256              Noemi Chapel, MD 08/21/22 2257

## 2022-08-31 ENCOUNTER — Ambulatory Visit
Admission: EM | Admit: 2022-08-31 | Discharge: 2022-08-31 | Disposition: A | Discharge status: Home / Self Care | Payer: Commercial Managed Care - HMO | Attending: Family Medicine | Admitting: Family Medicine

## 2022-08-31 DIAGNOSIS — R030 Elevated blood-pressure reading, without diagnosis of hypertension: Secondary | ICD-10-CM

## 2022-08-31 DIAGNOSIS — I10 Essential (primary) hypertension: Secondary | ICD-10-CM

## 2022-08-31 DIAGNOSIS — J22 Unspecified acute lower respiratory infection: Secondary | ICD-10-CM

## 2022-08-31 MED ORDER — ALBUTEROL SULFATE HFA 108 (90 BASE) MCG/ACT IN AERS: 2.0000 | INHALATION_SPRAY | RESPIRATORY_TRACT | 0 refills | Status: DC | PRN

## 2022-08-31 MED ORDER — LISINOPRIL 20 MG PO TABS: 20.0000 mg | ORAL_TABLET | Freq: Every day | ORAL | 0 refills | Status: DC

## 2022-08-31 MED ORDER — AZITHROMYCIN 250 MG PO TABS: 250.0000 mg | ORAL_TABLET | Freq: Every day | ORAL | 0 refills | Status: DC

## 2022-08-31 NOTE — Discharge Instructions (Signed)
You may take plain Mucinex, Coricidin HBP, Flonase, antihistamines as far as over-the-counter medications that will not affect your blood pressure when you have cough and congestion symptoms

## 2022-08-31 NOTE — ED Triage Notes (Signed)
Patient states she checked her blood pressure at home and it was 250/232 with a wrist BP monitor. Now BP is 175/83. Patient says she is prescribed to take metoprolol and does not take it since it makes her feel funny. Patient is having some congestion, headaches, cough that started a month ago.

## 2022-09-03 NOTE — ED Provider Notes (Signed)
[facility]    CSN: [id number] Arrival date & time: 08/31/22  1837      History   Chief Complaint Chief Complaint  Patient presents with   Hypertension    HPI Jasmine Davies is a 57 y.o. female.   Patient presenting today with about a week or 2 of elevated blood pressure readings, headaches off and on.  She states she used to take lisinopril which did very well for her and kept her blood pressures within normal range but she ran out, she was then given metoprolol which caused a lot of side effect so she stopped it but did not follow-up after stopping it.  Denies chest pain, shortness of breath, visual changes, mental status changes, syncope.  She is also having about a month of nasal congestion, cough.  Denies chest pain, shortness of breath, fevers.   Past Medical History:  Diagnosis Date   Chronic back pain    Chronic pain in left foot    Cleft foot L foot   Degenerative joint disease    Depression 2003   Hernia, hiatal    Hiatal hernia    Hypertension    PUD (peptic ulcer disease)    ? 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   Hypertensive crisis 07/03/2019   TIA (transient ischemic attack) 07/02/2019   Pain in left foot 12/23/2015   Cigarette nicotine dependence without complication 81/15/7262   Essential hypertension, benign 12/23/2015   History of alcohol abuse 12/23/2015   Abdominal pain, other specified site 09/20/2012   Nausea with vomiting 09/20/2012   Abdominal bloating 12/09/2011   PUD (peptic ulcer disease) 12/09/2011   Epigastric pain 10/01/2011   Hematemesis 10/01/2011   Abnormal CT scan, chest 10/01/2011   Abnormal CT scan 10/01/2011   Past Surgical History:  Procedure Laterality Date   CESAREAN SECTION     x2   [place]     left foot   ESOPHAGOGASTRODUODENOSCOPY  10/06/2011   ulcers,multiple in antrum  and duodenitis/hiatal hernia   ESOPHAGOGASTRODUODENOSCOPY  12/15/2011   SLF: Moderate gastritis/ Hiatal hernia/PUD IMPROVED BUT NOT RESOLVED DUE TO ONGOING ASA USE   TUBAL LIGATION  1999   OB History   No obstetric history on file.    Home Medications    Prior to Admission medications   Medication Sig Start Date End Date Taking? Authorizing Provider  azithromycin (ZITHROMAX) 250 MG tablet Take 1 tablet (250 mg total) by mouth daily. Take first 2 tablets together, then 1 every day until finished. 08/31/22  Yes Volney American, PA-C  albuterol (VENTOLIN HFA) 108 (90 Base) MCG/ACT inhaler Inhale 2 puffs into the lungs every 4 (four) hours as needed for wheezing or shortness of breath. 08/31/22   Volney American, PA-C  aspirin EC 81 MG tablet Take 81 mg by mouth daily.    [provider]  citalopram (CELEXA) 20 MG tablet TAKE 1 Tablet BY MOUTH ONCE EVERY DAY 10/30/20   Soyla Dryer, PA-C  citalopram (CELEXA) 20 MG tablet TAKE ONE (1) TABLET EACH DAY 10/30/20   Soyla Dryer, PA-C  lisinopril (ZESTRIL) 20 MG tablet Take 1 tablet (20 mg total) by mouth daily. 08/31/22   Volney American, PA-C  metoprolol tartrate (LOPRESSOR) 50 MG tablet Take 1 tablet (50 mg total) by mouth 2 (two) times daily.  10/30/20   Jacquelin Hawking, PA-C  metoprolol tartrate (LOPRESSOR) 50 MG tablet Take 1 tablet (50 mg total) by mouth 2 (two) times daily. 06/03/22   Zadie Rhine, MD  predniSONE (DELTASONE) 50 MG tablet 1 tablet PO QD X4 days 06/03/22   Zadie Rhine, MD    Family History Family History  Problem Relation Age of Onset   Hypertension Mother    Heart failure Father        CHF   Alcohol abuse Father    Cancer Father        Stomach Cancer   Colon cancer Neg Hx    Social History Social History   Tobacco Use   Smoking status: Every Day    Packs/day: 0.50    Years: 36.00    Total pack years: 18.00    Types: Cigarettes   Smokeless tobacco: Never  Vaping Use    Vaping Use: Never used  Substance Use Topics   Alcohol use: Not Currently    Alcohol/week: 67.0 standard drinks of alcohol    Types: 67 Cans of beer per week    Comment: 40 oz beer every other day.  none since 07/10/19   Drug use: Not Currently    Types: Marijuana    Allergies   Penicillins   Review of Systems Review of Systems PER HPI  Physical Exam Triage Vital Signs ED Triage Vitals  Enc Vitals Group     BP 08/31/22 1908 (!) 175/83     Pulse Rate 08/31/22 1908 76     Resp 08/31/22 1908 18     Temp 08/31/22 1903 97.6 F (36.4 C)     Temp Source 08/31/22 1903 Oral     SpO2 08/31/22 1908 95 %     Weight --      Height --      Head Circumference --      Peak Flow --      Pain Score 08/31/22 1934 0     Pain Loc --      Pain Edu? --      Excl. in GC? --    No data found.  Updated Vital Signs BP (!) 182/95 (BP Location: Right Arm)   Pulse (!) 115   Temp 99.2 F (37.3 C) (Oral)   Resp 18   SpO2 98%   Visual Acuity Right Eye Distance:   Left Eye Distance:   Bilateral Distance:    Right Eye Near:   Left Eye Near:    Bilateral Near:     Physical Exam Vitals and nursing note reviewed.  Constitutional:      Appearance: Normal appearance. She is not ill-appearing.  HENT:     Head: Atraumatic.     Mouth/Throat:     Mouth: Mucous membranes are moist.  Eyes:     Extraocular Movements: Extraocular movements intact.     Conjunctiva/sclera: Conjunctivae normal.     Pupils: Pupils are equal, round, and reactive to light.  Cardiovascular:     Rate and Rhythm: Normal rate and regular rhythm.     Heart sounds: Normal heart sounds.  Pulmonary:     Effort: Pulmonary effort is normal.     Breath sounds: Normal breath sounds. No wheezing or rales.  Musculoskeletal:        General: Normal range of motion.     Cervical back: Normal range of motion and neck supple.  Skin:    General: Skin is warm and dry.  Neurological:  Mental Status: She is alert and oriented  to person, place, and time.     Motor: No weakness.     Gait: Gait normal.  Psychiatric:        Mood and Affect: Mood normal.        Thought Content: Thought content normal.        Judgment: Judgment normal.    UC Treatments / Results  Labs (all labs ordered are listed, but only abnormal results are displayed) Labs Reviewed - No data to display  EKG  Radiology No results found.  Procedures Procedures (including critical care time)  Medications Ordered in UC Medications - No data to display  Initial Impression / Assessment and Plan / UC Course  I have reviewed the triage vital signs and the nursing notes.  Pertinent labs & imaging results that were available during my care of the patient were reviewed by me and considered in my medical decision making (see chart for details).     Hypertensive today in triage and mildly tachycardic.  Been off of her medications for quite some time due to side effects and running out.  Will restart lisinopril since she did very well on this previously and have her log home readings and follow-up as soon as possible with her PCP.  Regarding her ongoing respiratory symptoms, treat with Zithromax, albuterol, supportive over-the-counter medications and home care.  Return for any worsening symptoms.  Final Clinical Impressions(s) / UC Diagnoses   Final diagnoses:  Elevated blood pressure reading  Essential hypertension  Lower respiratory infection     Discharge Instructions      You may take plain Mucinex, Coricidin HBP, Flonase, antihistamines as far as over-the-counter medications that will not affect your blood pressure when you have cough and congestion symptoms    ED Prescriptions     Medication Sig Dispense Auth. Provider   albuterol (VENTOLIN HFA) 108 (90 Base) MCG/ACT inhaler Inhale 2 puffs into the lungs every 4 (four) hours as needed for wheezing or shortness of breath. 18 g Volney American, [place]   lisinopril (ZESTRIL)  20 MG tablet Take 1 tablet (20 mg total) by mouth daily. 90 tablet Merrie Roof [place], [place]   azithromycin (ZITHROMAX) 250 MG tablet Take 1 tablet (250 mg total) by mouth daily. Take first 2 tablets together, then 1 every day until finished. 6 tablet Volney American, [place]      PDMP not reviewed this encounter.   Volney American, [place] 09/03/22 1715

## 2022-09-06 ENCOUNTER — Other Ambulatory Visit: Payer: Self-pay

## 2022-09-06 ENCOUNTER — Emergency Department (HOSPITAL_COMMUNITY)
Admission: EM | Admit: 2022-09-06 | Discharge: 2022-09-06 | Disposition: A | Discharge status: Home / Self Care | Payer: Commercial Managed Care - HMO | Attending: Emergency Medicine | Admitting: Emergency Medicine

## 2022-09-06 ENCOUNTER — Encounter: Payer: Self-pay | Admitting: Emergency Medicine

## 2022-09-06 ENCOUNTER — Emergency Department (HOSPITAL_COMMUNITY): Payer: Commercial Managed Care - HMO

## 2022-09-06 ENCOUNTER — Ambulatory Visit
Admission: EM | Admit: 2022-09-06 | Discharge: 2022-09-06 | Disposition: A | Discharge status: Home / Self Care | Payer: Commercial Managed Care - HMO

## 2022-09-06 ENCOUNTER — Encounter (HOSPITAL_COMMUNITY): Payer: Self-pay

## 2022-09-06 DIAGNOSIS — S022XXA Fracture of nasal bones, initial encounter for closed fracture: Secondary | ICD-10-CM

## 2022-09-06 DIAGNOSIS — S0990XA Unspecified injury of head, initial encounter: Secondary | ICD-10-CM | POA: Diagnosis present

## 2022-09-06 DIAGNOSIS — W19XXXA Unspecified fall, initial encounter: Secondary | ICD-10-CM | POA: Diagnosis not present

## 2022-09-06 DIAGNOSIS — Z7982 Long term (current) use of aspirin: Secondary | ICD-10-CM | POA: Diagnosis not present

## 2022-09-06 DIAGNOSIS — Z79899 Other long term (current) drug therapy: Secondary | ICD-10-CM | POA: Diagnosis not present

## 2022-09-06 DIAGNOSIS — R55 Syncope and collapse: Secondary | ICD-10-CM | POA: Insufficient documentation

## 2022-09-06 DIAGNOSIS — I10 Essential (primary) hypertension: Secondary | ICD-10-CM | POA: Diagnosis not present

## 2022-09-06 LAB — CBC
HCT: 42.5 % (ref 36.0–46.0)
Hemoglobin: 14.3 g/dL (ref 12.0–15.0)
MCH: 33.9 pg (ref 26.0–34.0)
MCHC: 33.6 g/dL (ref 30.0–36.0)
MCV: 100.7 fL — ABNORMAL HIGH (ref 80.0–100.0)
Platelets: 335 10*3/uL (ref 150–400)
RBC: 4.22 MIL/uL (ref 3.87–5.11)
RDW: 13.4 % (ref 11.5–15.5)
WBC: 12.8 10*3/uL — ABNORMAL HIGH (ref 4.0–10.5)
nRBC: 0 % (ref 0.0–0.2)

## 2022-09-06 LAB — COMPREHENSIVE METABOLIC PANEL
ALT: 16 U/L (ref 0–44)
AST: 23 U/L (ref 15–41)
Albumin: 3.8 g/dL (ref 3.5–5.0)
Alkaline Phosphatase: 74 U/L (ref 38–126)
Anion gap: 11 (ref 5–15)
BUN: 10 mg/dL (ref 6–20)
CO2: 23 mmol/L (ref 22–32)
Calcium: 8.2 mg/dL — ABNORMAL LOW (ref 8.9–10.3)
Chloride: 106 mmol/L (ref 98–111)
Creatinine, Ser: 0.78 mg/dL (ref 0.44–1.00)
GFR, Estimated: >60 mL/min (ref >60)
Glucose, Bld: 93 mg/dL (ref 70–99)
Potassium: 3.9 mmol/L (ref 3.5–5.1)
Sodium: 140 mmol/L (ref 135–145)
Total Bilirubin: 0.3 mg/dL (ref 0.3–1.2)
Total Protein: 7.2 g/dL (ref 6.5–8.1)

## 2022-09-06 LAB — URINALYSIS, ROUTINE W REFLEX MICROSCOPIC
Bilirubin Urine: NEGATIVE
Glucose, UA: NEGATIVE mg/dL
Hgb urine dipstick: NEGATIVE
Ketones, ur: NEGATIVE mg/dL
Leukocytes,Ua: NEGATIVE
Nitrite: NEGATIVE
Protein, ur: NEGATIVE mg/dL
Specific Gravity, Urine: 1.014 (ref 1.005–1.030)
pH: 5 (ref 5.0–8.0)

## 2022-09-06 LAB — ETHANOL: Alcohol, Ethyl (B): 18 mg/dL — ABNORMAL HIGH (ref <10)

## 2022-09-06 LAB — CBG MONITORING, ED: Glucose-Capillary: 114 mg/dL — ABNORMAL HIGH (ref 70–99)

## 2022-09-06 LAB — POC URINE PREG, ED: Preg Test, Ur: NEGATIVE

## 2022-09-06 MED ORDER — ADULT MULTIVITAMIN W/MINERALS CH
1.0000 | ORAL_TABLET | Freq: Once | ORAL | Status: AC
  Administered 2022-09-06: 1 via ORAL
  Filled 2022-09-06: qty 1

## 2022-09-06 MED ORDER — ACETAMINOPHEN 325 MG PO TABS
650.0000 mg | ORAL_TABLET | Freq: Once | ORAL | Status: AC
  Administered 2022-09-06: 650 mg via ORAL
  Filled 2022-09-06: qty 2

## 2022-09-06 MED ORDER — FOLIC ACID 1 MG PO TABS
1.0000 mg | ORAL_TABLET | Freq: Once | ORAL | Status: AC
  Administered 2022-09-06: 1 mg via ORAL
  Filled 2022-09-06: qty 1

## 2022-09-06 MED ORDER — THIAMINE MONONITRATE 100 MG PO TABS
100.0000 mg | ORAL_TABLET | Freq: Every day | ORAL | Status: DC
  Administered 2022-09-06: 100 mg via ORAL
  Filled 2022-09-06: qty 1

## 2022-09-06 MED ORDER — SODIUM CHLORIDE 0.9 % IV BOLUS
1000.0000 mL | Freq: Once | INTRAVENOUS | Status: AC
  Administered 2022-09-06: 1000 mL via INTRAVENOUS

## 2022-09-06 NOTE — ED Triage Notes (Addendum)
Pt reports had a friend over last night and the last thing pt remembers is friend left residence and pt was getting ready for bed. Pt reports next thing she remembers, is waking up this morning and blood was on the floor. Pt does not recall any events of last night past friend leaving. Pt reports "I dont even remember how I got in the bed." Pt reports had 2 40 oz of alcohol yesterday and reports averages same per day.  Pt alert and oriented x4, PERRLA intact, speech clear. Moderate swelling noted to nose/face. Large abrasion with dried blood noted to forehead.denies being on blood thinners. History of stroke. Denies numbness,tingling in extremities,visual changes. Steady gait noted in triage. Airway patent.  PA aware.

## 2022-09-06 NOTE — ED Notes (Signed)
Patient is being discharged from the Urgent Care and sent to the Emergency Department via POV . Per PA, patient is in need of higher level of care due to head injury/memory loss. Patient is aware and verbalizes understanding of plan of care.  Vitals:   09/06/22 1228  BP: (!) 145/88  Pulse: 85  Resp: 20  Temp: 98.1 F (36.7 C)  SpO2: 93%

## 2022-09-06 NOTE — Discharge Instructions (Addendum)
Please return to the ED with any new or worsening signs or symptoms Please follow-up with ENT.  Please call Monday make an appointment to be seen. Please read attached guides concerning nasal bone fractures.  Please do not blow your nose, utilize straws until seen by ENT. You may take ibuprofen or Tylenol for pain control Please decrease use of alcohol as we discussed

## 2022-09-06 NOTE — ED Provider Notes (Signed)
[place] [facility] AT [facility] Provider Note   CSN: [id number] Arrival date & time: 09/06/22  1245     History  Chief Complaint  Patient presents with   Dizziness   Loss of Consciousness    Jasmine Davies is a 57 y.o. female with medical history of chronic back pain, chronic pain in left foot, DJD, hypertension, peptic ulcer disease, alcoholism.  Patient presents to ED for evaluation of intoxication, fall.  Patient reports that every night for her entire life she is drinking 240 ounce beers before bed.  Patient reports that she followed her usual routine last night drinking 240 ounce beers in the last thing she remembers was around 9 PM getting ready for bed.  Patient reports that at that point she "blacked out" and is unsure why.  Patient denies ever blacking out with ingestion of two 40 ounce beers in the past.  Patient states that the next thing that she remembers was waking up in her bed this morning around 7 AM in a pool of blood.  Patient reports that she is now having neck pain, headache, facial pain.  Patient does have swelling, ecchymosis over her left orbit as well as a laceration to her forehead.  Patient in c-collar.  Patient denies any leg swelling, fevers, nausea, vomiting, back pain, abdominal pain, chest pain or shortness of breath.   Dizziness Associated symptoms: headaches and syncope   Associated symptoms: no chest pain, no nausea, no shortness of breath and no vomiting   Loss of Consciousness Associated symptoms: headaches   Associated symptoms: no chest pain, no fever, no nausea, no shortness of breath and no vomiting        Home Medications Prior to Admission medications   Medication Sig Start Date End Date Taking? Authorizing Provider  albuterol (VENTOLIN HFA) 108 (90 Base) MCG/ACT inhaler Inhale 2 puffs into the lungs every 4 (four) hours as needed for wheezing or shortness of breath. Patient taking differently: Inhale 2 puffs into the  lungs every 4 (four) hours as needed for wheezing or shortness of breath. Has not had px filled yet. 08/31/22   Particia Nearing, PA-C  aspirin EC 81 MG tablet Take 81 mg by mouth daily.    [provider]  azithromycin (ZITHROMAX) 250 MG tablet Take 1 tablet (250 mg total) by mouth daily. Take first 2 tablets together, then 1 every day until finished. Patient taking differently: Take 250 mg by mouth daily. Take first 2 tablets together, then 1 every day until finished. 08/31/22   Particia Nearing, PA-C  citalopram (CELEXA) 20 MG tablet TAKE 1 Tablet BY MOUTH ONCE EVERY DAY 10/30/20   Jacquelin Hawking, PA-C  citalopram (CELEXA) 20 MG tablet TAKE ONE (1) TABLET EACH DAY 10/30/20   Jacquelin Hawking, PA-C  lisinopril (ZESTRIL) 20 MG tablet Take 1 tablet (20 mg total) by mouth daily. 08/31/22   Particia Nearing, PA-C  metoprolol tartrate (LOPRESSOR) 50 MG tablet Take 1 tablet (50 mg total) by mouth 2 (two) times daily. 10/30/20   Jacquelin Hawking, PA-C  metoprolol tartrate (LOPRESSOR) 50 MG tablet Take 1 tablet (50 mg total) by mouth 2 (two) times daily. 06/03/22   Zadie Rhine, MD  predniSONE (DELTASONE) 50 MG tablet 1 tablet PO QD X4 days 06/03/22   Zadie Rhine, MD      Allergies    Penicillins    Review of Systems   Review of Systems  Constitutional:  Negative for fever.  HENT:  Positive for facial swelling.   Respiratory:  Negative for shortness of breath.   Cardiovascular:  Positive for syncope. Negative for chest pain and leg swelling.  Gastrointestinal:  Negative for abdominal pain, nausea and vomiting.  Musculoskeletal:  Positive for neck pain. Negative for back pain.  Neurological:  Positive for headaches.    Physical Exam Updated Vital Signs BP (!) 169/65   Pulse 73   Temp 98.3 F (36.8 C) (Oral)   Resp 17   Ht 4\' 11"  (1.499 m)   Wt 54.4 kg   SpO2 100%   BMI 24.24 kg/m  Physical Exam Vitals and nursing note reviewed.  Constitutional:       General: She is not in acute distress.    Appearance: Normal appearance. She is not ill-appearing, toxic-appearing or diaphoretic.  HENT:     Head: Normocephalic and atraumatic.      Comments: Nonpainful EOMs    Nose: Signs of injury and nasal tenderness present. No nasal deformity or septal deviation.     Mouth/Throat:     Mouth: Mucous membranes are moist.     Pharynx: Oropharynx is clear.  Eyes:     Extraocular Movements: Extraocular movements intact.     Conjunctiva/sclera: Conjunctivae normal.     Pupils: Pupils are equal, round, and reactive to light.  Cardiovascular:     Rate and Rhythm: Normal rate and regular rhythm.  Pulmonary:     Effort: Pulmonary effort is normal.     Breath sounds: Normal breath sounds. No wheezing.  Abdominal:     General: Abdomen is flat. Bowel sounds are normal.     Palpations: Abdomen is soft.     Tenderness: There is no abdominal tenderness.     Comments: Abdomen nontender to palpation  Musculoskeletal:     Cervical back: Normal range of motion and neck supple. Tenderness present.     Comments: Patient in c-collar  Skin:    General: Skin is warm and dry.     Capillary Refill: Capillary refill takes less than 2 seconds.  Neurological:     General: No focal deficit present.     Mental Status: She is alert and oriented to person, place, and time.     GCS: GCS eye subscore is 4. GCS verbal subscore is 5. GCS motor subscore is 6.     Cranial Nerves: Cranial nerves 2-12 are intact. No cranial nerve deficit.     Sensory: Sensation is intact. No sensory deficit.     Motor: Motor function is intact. No weakness.     Coordination: Coordination is intact. Heel to [facility] Test normal.     ED Results / Procedures / Treatments   Labs (all labs ordered are listed, but only abnormal results are displayed) Labs Reviewed  CBC - Abnormal; Notable for the following components:      Result Value   WBC 12.8 (*)    MCV 100.7 (*)    All other components  within normal limits  URINALYSIS, ROUTINE W REFLEX MICROSCOPIC - Abnormal; Notable for the following components:   APPearance HAZY (*)    All other components within normal limits  COMPREHENSIVE METABOLIC PANEL - Abnormal; Notable for the following components:   Calcium 8.2 (*)    All other components within normal limits  ETHANOL - Abnormal; Notable for the following components:   Alcohol, Ethyl (B) 18 (*)    All other components within normal limits  CBG MONITORING, ED - Abnormal; Notable for the  following components:   Glucose-Capillary 114 (*)    All other components within normal limits  CBG MONITORING, ED  POC URINE PREG, ED    EKG EKG Interpretation  Date/Time:  Sunday September 06 2022 12:56:06 EST Ventricular Rate:  83 PR Interval:  124 QRS Duration: 73 QT Interval:  381 QTC Calculation: 448 R Axis:   66 Text Interpretation: Sinus rhythm Confirmed by Noemi Chapel [id number]) on 09/06/2022 1:30:57 PM  Radiology CT Head Wo Contrast  Result Date: 09/06/2022 CLINICAL DATA:  Head trauma, moderate-severe; Facial trauma, blunt; Neck trauma, intoxicated or obtunded (Age >= 16y) EXAM: CT HEAD WITHOUT CONTRAST CT MAXILLOFACIAL WITHOUT CONTRAST CT CERVICAL SPINE WITHOUT CONTRAST TECHNIQUE: Multidetector CT imaging of the head, cervical spine, and maxillofacial structures were performed using the standard protocol without intravenous contrast. Multiplanar CT image reconstructions of the cervical spine and maxillofacial structures were also generated. RADIATION DOSE REDUCTION: This exam was performed according to the departmental dose-optimization program which includes automated exposure control, adjustment of the mA and/or kV according to patient size and/or use of iterative reconstruction technique. COMPARISON:  07/02/2019 FINDINGS: CT HEAD FINDINGS Brain: No evidence of acute infarction, hemorrhage, hydrocephalus, extra-axial collection or mass lesion/mass effect. Vascular: No hyperdense  vessel or unexpected calcification. Skull: Normal. Negative for fracture or focal lesion. Other: Anterior frontal scalp/forehead hematoma. Partial left mastoid effusion. CT MAXILLOFACIAL FINDINGS Osseous: No acute maxillofacial bone fracture. Bony orbital walls are intact. Mandible intact. Temporomandibular joints are aligned without dislocation. Poor dentition with multiple dental caries. Prominent periapical lucency associated with the right mandibular second molar. Orbits: Negative. No traumatic or inflammatory finding. Sinuses: Complete opacification of the right frontal sinus and partial opacification of the anterior right ethmoid air cells. Minimal mucosal thickening within the left maxillary sinus. Soft tissues: Soft tissue injury of the forehead. CT CERVICAL SPINE FINDINGS Alignment: Facet joints are aligned without dislocation or traumatic listhesis. Dens and lateral masses are aligned. Skull base and vertebrae: No acute fracture. No primary bone lesion or focal pathologic process. Soft tissues and spinal canal: No prevertebral fluid or swelling. No visible canal hematoma. Disc levels: Degenerative disc disease of C6-C7. Facet arthropathy is most pronounced on the left at C3-C4. Upper chest: Mild centrilobular emphysema. Other: Bilateral carotid atherosclerosis. IMPRESSION: 1. No acute intracranial abnormality. 2. Anterior frontal scalp/forehead hematoma. 3. No acute maxillofacial bone fracture. 4. No acute fracture or subluxation of the cervical spine. Electronically Signed   By: Davina Poke D.O.   On: 09/06/2022 14:56   CT Cervical Spine Wo Contrast  Result Date: 09/06/2022 CLINICAL DATA:  Head trauma, moderate-severe; Facial trauma, blunt; Neck trauma, intoxicated or obtunded (Age >= 16y) EXAM: CT HEAD WITHOUT CONTRAST CT MAXILLOFACIAL WITHOUT CONTRAST CT CERVICAL SPINE WITHOUT CONTRAST TECHNIQUE: Multidetector CT imaging of the head, cervical spine, and maxillofacial structures were performed  using the standard protocol without intravenous contrast. Multiplanar CT image reconstructions of the cervical spine and maxillofacial structures were also generated. RADIATION DOSE REDUCTION: This exam was performed according to the departmental dose-optimization program which includes automated exposure control, adjustment of the mA and/or kV according to patient size and/or use of iterative reconstruction technique. COMPARISON:  07/02/2019 FINDINGS: CT HEAD FINDINGS Brain: No evidence of acute infarction, hemorrhage, hydrocephalus, extra-axial collection or mass lesion/mass effect. Vascular: No hyperdense vessel or unexpected calcification. Skull: Normal. Negative for fracture or focal lesion. Other: Anterior frontal scalp/forehead hematoma. Partial left mastoid effusion. CT MAXILLOFACIAL FINDINGS Osseous: No acute maxillofacial bone fracture. Bony orbital walls are intact. Mandible intact.  Temporomandibular joints are aligned without dislocation. Poor dentition with multiple dental caries. Prominent periapical lucency associated with the right mandibular second molar. Orbits: Negative. No traumatic or inflammatory finding. Sinuses: Complete opacification of the right frontal sinus and partial opacification of the anterior right ethmoid air cells. Minimal mucosal thickening within the left maxillary sinus. Soft tissues: Soft tissue injury of the forehead. CT CERVICAL SPINE FINDINGS Alignment: Facet joints are aligned without dislocation or traumatic listhesis. Dens and lateral masses are aligned. Skull base and vertebrae: No acute fracture. No primary bone lesion or focal pathologic process. Soft tissues and spinal canal: No prevertebral fluid or swelling. No visible canal hematoma. Disc levels: Degenerative disc disease of C6-C7. Facet arthropathy is most pronounced on the left at C3-C4. Upper chest: Mild centrilobular emphysema. Other: Bilateral carotid atherosclerosis. IMPRESSION: 1. No acute intracranial  abnormality. 2. Anterior frontal scalp/forehead hematoma. 3. No acute maxillofacial bone fracture. 4. No acute fracture or subluxation of the cervical spine. Electronically Signed   By: Davina Poke D.O.   On: 09/06/2022 14:56   CT Maxillofacial Wo Contrast  Result Date: 09/06/2022 CLINICAL DATA:  Head trauma, moderate-severe; Facial trauma, blunt; Neck trauma, intoxicated or obtunded (Age >= 16y) EXAM: CT HEAD WITHOUT CONTRAST CT MAXILLOFACIAL WITHOUT CONTRAST CT CERVICAL SPINE WITHOUT CONTRAST TECHNIQUE: Multidetector CT imaging of the head, cervical spine, and maxillofacial structures were performed using the standard protocol without intravenous contrast. Multiplanar CT image reconstructions of the cervical spine and maxillofacial structures were also generated. RADIATION DOSE REDUCTION: This exam was performed according to the departmental dose-optimization program which includes automated exposure control, adjustment of the mA and/or kV according to patient size and/or use of iterative reconstruction technique. COMPARISON:  07/02/2019 FINDINGS: CT HEAD FINDINGS Brain: No evidence of acute infarction, hemorrhage, hydrocephalus, extra-axial collection or mass lesion/mass effect. Vascular: No hyperdense vessel or unexpected calcification. Skull: Normal. Negative for fracture or focal lesion. Other: Anterior frontal scalp/forehead hematoma. Partial left mastoid effusion. CT MAXILLOFACIAL FINDINGS Osseous: No acute maxillofacial bone fracture. Bony orbital walls are intact. Mandible intact. Temporomandibular joints are aligned without dislocation. Poor dentition with multiple dental caries. Prominent periapical lucency associated with the right mandibular second molar. Orbits: Negative. No traumatic or inflammatory finding. Sinuses: Complete opacification of the right frontal sinus and partial opacification of the anterior right ethmoid air cells. Minimal mucosal thickening within the left maxillary sinus.  Soft tissues: Soft tissue injury of the forehead. CT CERVICAL SPINE FINDINGS Alignment: Facet joints are aligned without dislocation or traumatic listhesis. Dens and lateral masses are aligned. Skull base and vertebrae: No acute fracture. No primary bone lesion or focal pathologic process. Soft tissues and spinal canal: No prevertebral fluid or swelling. No visible canal hematoma. Disc levels: Degenerative disc disease of C6-C7. Facet arthropathy is most pronounced on the left at C3-C4. Upper chest: Mild centrilobular emphysema. Other: Bilateral carotid atherosclerosis. IMPRESSION: 1. No acute intracranial abnormality. 2. Anterior frontal scalp/forehead hematoma. 3. No acute maxillofacial bone fracture. 4. No acute fracture or subluxation of the cervical spine. Electronically Signed   By: Davina Poke D.O.   On: 09/06/2022 14:56    Procedures Procedures    Medications Ordered in ED Medications  thiamine (VITAMIN B1) tablet 100 mg (100 mg Oral Given 11/21/22 4010)  folic acid (FOLVITE) tablet 1 mg (1 mg Oral Given 09/06/22 1351)  multivitamin with minerals tablet 1 tablet (1 tablet Oral Given 09/06/22 1351)  acetaminophen (TYLENOL) tablet 650 mg (650 mg Oral Given 09/06/22 1435)  sodium chloride 0.9 %  bolus 1,000 mL (1,000 mLs Intravenous New Bag/Given 09/06/22 1439)    ED Course/ Medical Decision Making/ A&P                          Medical Decision Making Amount and/or Complexity of Data Reviewed Labs: ordered. Radiology: ordered.   57 year old female presents to ED for evaluation.  Please see HPI for further details.  On examination the patient is afebrile and nontachycardic.  Patient lung sounds are clear bilaterally, she is not hypoxic on room air.  Patient abdomen soft and compressible.  Neurological examination shows no focal neurodeficits.  Patient does have bruising above her left orbit, forehead.  EOMs nonpainful, intact.  No tenderness to palpation of chest wall.  No tenderness to  palpation of the lower extremities bilaterally.  No tenderness to palpation of upper extremities bilaterally.  Patient cervical spine tender however no obvious crepitus or step-off.  Patient workup will include CBC, CMP, urinalysis, ethanol, CBG, CT maxillofacial, CT head, CT cervical spine.  Patient provided 60 mg Tylenol for headache, 1 mg folic acid, multivitamin, 100 mg thiamine, 1 L fluid.  Patient CBC with leukocytosis 12.8 however stable hemoglobin.  Patient is afebrile and nontachycardic.  Leukocytosis could be secondary to stress response.  CMP unremarkable, no elevated liver enzymes.  Patient urinalysis unremarkable.  Patient CBG 114.  Patient ethanol 18.  Patient CT head unremarkable.  Patient CT cervical spine unremarkable.  Patient CT maxillofacial unremarkable per radiology read however me and my attending have viewed the film and we feel as if this patient does have a nasal bone fracture.  Patient will be given instructions not to blow her nose, not to utilize straws until seen by ENT.  Patient will be referred to ENT.  Prior to discharge, I discussed with the patient about her need to decrease alcohol use.  Patient states that she wishes to stop drinking alcohol.  I provided her with resources.  Patient be discharged home in stable condition.  Patient has return precautions provided and she voiced understanding.  Patient discharged.   Final Clinical Impression(s) / ED Diagnoses Final diagnoses:  Syncope and collapse  Closed fracture of nasal bone, initial encounter    Rx / DC Orders ED Discharge Orders     None         Clent Ridges 09/06/22 1543    Eber Hong, MD 09/17/22 [phone]

## 2022-09-06 NOTE — ED Notes (Signed)
C - collar applied for neck pain.

## 2022-09-06 NOTE — ED Triage Notes (Signed)
Pt fell yesterday but is not aware of when or where she fell. Pt drank 2 forty oz's yesterday and daily. Pt stated she woke up with a bloody face and bruises. Pt last drink yesterday evening. Bruises , healing laceration to forehead and swelling noted to face and head.

## 2022-09-18 ENCOUNTER — Ambulatory Visit (INDEPENDENT_AMBULATORY_CARE_PROVIDER_SITE_OTHER): Payer: Commercial Managed Care - HMO | Admitting: Family Medicine

## 2022-09-18 ENCOUNTER — Encounter: Payer: Self-pay | Admitting: Family Medicine

## 2022-09-18 VITALS — BP 178/82 | HR 69 | Ht 59.0 in | Wt 114.0 lb

## 2022-09-18 DIAGNOSIS — E785 Hyperlipidemia, unspecified: Secondary | ICD-10-CM

## 2022-09-18 DIAGNOSIS — Z1211 Encounter for screening for malignant neoplasm of colon: Secondary | ICD-10-CM

## 2022-09-18 DIAGNOSIS — R7301 Impaired fasting glucose: Secondary | ICD-10-CM

## 2022-09-18 DIAGNOSIS — Z1159 Encounter for screening for other viral diseases: Secondary | ICD-10-CM

## 2022-09-18 DIAGNOSIS — E039 Hypothyroidism, unspecified: Secondary | ICD-10-CM

## 2022-09-18 DIAGNOSIS — I10 Essential (primary) hypertension: Secondary | ICD-10-CM | POA: Diagnosis not present

## 2022-09-18 DIAGNOSIS — Z122 Encounter for screening for malignant neoplasm of respiratory organs: Secondary | ICD-10-CM

## 2022-09-18 DIAGNOSIS — Z23 Encounter for immunization: Secondary | ICD-10-CM | POA: Diagnosis not present

## 2022-09-18 MED ORDER — TELMISARTAN-HCTZ 40-12.5 MG PO TABS: 1.0000 | ORAL_TABLET | Freq: Every day | ORAL | 1 refills | Status: DC

## 2022-09-18 NOTE — Addendum Note (Signed)
Addended by: Smitty Knudsen on: 09/18/2022 11:42 AM   Modules accepted: Orders

## 2022-09-18 NOTE — Progress Notes (Signed)
New Patient Office Visit   Subjective   Patient ID: Jasmine Davies, female    DOB: 1966/04/01  Age: 57 y.o. MRN: [id number]  CC:  Chief Complaint  Patient presents with   Establish Care   Hypertension    HPI Jasmine Davies presents to establish care. She  has a past medical history of Chronic back pain, Chronic pain in left foot, Cleft foot (L foot), Degenerative joint disease, Depression (2003), Hernia, hiatal, Hiatal hernia, Hypertension, PUD (peptic ulcer disease), and Sciatica.  Hypertension: Patient here for follow-up of elevated blood pressure. She is exercising and is adherent to low salt diet.  Blood pressure is not well controlled at home. Cardiac symptoms dyspnea. Patient denies chest pain, fatigue, lower extremity edema, and palpitations.  Cardiovascular risk factors: hypertension and smoking/ tobacco exposure.     Outpatient Encounter Medications as of 09/18/2022  Medication Sig   aspirin EC 81 MG tablet Take 81 mg by mouth daily.   telmisartan-hydrochlorothiazide (MICARDIS HCT) 40-12.5 MG tablet Take 1 tablet by mouth daily.   [DISCONTINUED] lisinopril (ZESTRIL) 20 MG tablet Take 1 tablet (20 mg total) by mouth daily.   albuterol (VENTOLIN HFA) 108 (90 Base) MCG/ACT inhaler Inhale 2 puffs into the lungs every 4 (four) hours as needed for wheezing or shortness of breath. (Patient not taking: Reported on 09/18/2022)   azithromycin (ZITHROMAX) 250 MG tablet Take 1 tablet (250 mg total) by mouth daily. Take first 2 tablets together, then 1 every day until finished. (Patient taking differently: Take 250 mg by mouth daily. Take first 2 tablets together, then 1 every day until finished.)   citalopram (CELEXA) 20 MG tablet TAKE 1 Tablet BY MOUTH ONCE EVERY DAY   citalopram (CELEXA) 20 MG tablet TAKE ONE (1) TABLET EACH DAY   metoprolol tartrate (LOPRESSOR) 50 MG tablet Take 1 tablet (50 mg total) by mouth 2 (two) times daily.   metoprolol tartrate (LOPRESSOR) 50 MG tablet Take 1 tablet  (50 mg total) by mouth 2 (two) times daily.   predniSONE (DELTASONE) 50 MG tablet 1 tablet PO QD X4 days   No facility-administered encounter medications on file as of 09/18/2022.    Past Surgical History:  Procedure Laterality Date   CESAREAN SECTION     x2   CHOLECYSTECTOMY  1993   CLUB FOOT RELEASE     left foot   ESOPHAGOGASTRODUODENOSCOPY  10/06/2011   ulcers,multiple in antrum and duodenitis/hiatal hernia   ESOPHAGOGASTRODUODENOSCOPY  12/15/2011   SLF: Moderate gastritis/ Hiatal hernia/PUD IMPROVED BUT NOT RESOLVED DUE TO ONGOING ASA USE   TUBAL LIGATION  1999    Review of Systems  Constitutional:  Negative for chills and fever.  Respiratory:  Negative for shortness of breath.   Cardiovascular:  Negative for chest pain, palpitations, orthopnea, claudication and leg swelling.  Gastrointestinal:  Negative for heartburn.  Genitourinary:  Negative for dysuria.  Neurological:  Negative for dizziness and headaches.  Psychiatric/Behavioral:  Negative for depression. The patient is not nervous/anxious.       Objective    BP (!) 178/82   Pulse 69   Ht 4' 11"$  (1.499 m)   Wt 114 lb (51.7 kg)   SpO2 98%   BMI 23.03 kg/m   Physical Exam Constitutional:      Appearance: Normal appearance.  Cardiovascular:     Rate and Rhythm: Normal rate and regular rhythm.     Pulses: Normal pulses.  Pulmonary:     Effort: Pulmonary effort is normal. No  respiratory distress.     Breath sounds: Normal breath sounds.  Abdominal:     General: Bowel sounds are normal. There is no distension.     Palpations: Abdomen is soft.  Musculoskeletal:        General: Normal range of motion.  Skin:    General: Skin is warm and dry.     Capillary Refill: Capillary refill takes less than 2 seconds.  Neurological:     Mental Status: She is alert.     Coordination: Coordination normal.     Gait: Gait normal.  Psychiatric:        Mood and Affect: Mood normal.       Assessment & Plan:   Hyperlipidemia, unspecified hyperlipidemia type -     Lipid panel  IFG (impaired fasting glucose) -     Hemoglobin A1c  Primary hypertension Assessment & Plan: Blood pressure not well controlled.D/c lisinopril, started patient on Telmisartan-hydrochlorothiazide 40-12.71m  Discussed medication desired effects, potential side effects. Non Pharmacological interventions such as low salt, cardiac diet discussed. Educated on stress reduction and physical activity. Discussed signs and symptoms of major cardiovascular event and need to present to the ED. Follow up in 2 weeks with B/P reading logs Patient verbalizes understanding regarding plan of care and all questions answered.   Orders: -     Microalbumin / creatinine urine ratio -     Telmisartan-HCTZ; Take 1 tablet by mouth daily.  Dispense: 30 tablet; Refill: 1  Need for hepatitis C screening test -     Hepatitis C antibody  Screening for lung cancer -     Ambulatory Referral for Lung Cancer Scre  Hypothyroidism, unspecified type -     TSH + free T4  Screening for colon cancer -     Fecal occult blood, imunochemical    Return in about 2 weeks (around 10/02/2022) for hypertension, re-check blood pressure.   IRenard HamperORia Comment FNP

## 2022-09-18 NOTE — Patient Instructions (Signed)
It was a pleasure meeting with you today. Please take medications as prescribed Please follow up in 2 weeks with blood pressure reading  log

## 2022-09-18 NOTE — Assessment & Plan Note (Signed)
Blood pressure not well controlled.D/c lisinopril, started patient on Telmisartan-hydrochlorothiazide 40-12.19m  Discussed medication desired effects, potential side effects. Non Pharmacological interventions such as low salt, cardiac diet discussed. Educated on stress reduction and physical activity. Discussed signs and symptoms of major cardiovascular event and need to present to the ED. Follow up in 2 weeks with B/P reading logs Patient verbalizes understanding regarding plan of care and all questions answered.

## 2022-09-21 LAB — MICROALBUMIN / CREATININE URINE RATIO
Creatinine, Urine: 19.6 mg/dL
Microalb/Creat Ratio: <15 mg/g creat (ref 0–29)
Microalbumin, Urine: <3 ug/mL

## 2022-09-21 LAB — LIPID PANEL
Chol/HDL Ratio: 2.5 ratio (ref 0.0–4.4)
Cholesterol, Total: 175 mg/dL (ref 100–199)
HDL: 71 mg/dL (ref >39)
LDL Chol Calc (NIH): 92 mg/dL (ref 0–99)
Triglycerides: 63 mg/dL (ref 0–149)
VLDL Cholesterol Cal: 12 mg/dL (ref 5–40)

## 2022-09-21 LAB — HEMOGLOBIN A1C
Est. average glucose Bld gHb Est-mCnc: 108 mg/dL
Hgb A1c MFr Bld: 5.4 % (ref 4.8–5.6)

## 2022-09-21 LAB — HEPATITIS C ANTIBODY: Hep C Virus Ab: NONREACTIVE

## 2022-09-21 LAB — TSH+FREE T4
Free T4: 1.16 ng/dL (ref 0.82–1.77)
TSH: 1.14 u[IU]/mL (ref 0.450–4.500)

## 2022-10-02 ENCOUNTER — Ambulatory Visit (INDEPENDENT_AMBULATORY_CARE_PROVIDER_SITE_OTHER): Payer: Commercial Managed Care - HMO | Admitting: Family Medicine

## 2022-10-02 ENCOUNTER — Encounter: Payer: Self-pay | Admitting: Family Medicine

## 2022-10-02 VITALS — BP 132/78 | HR 88 | Ht 59.0 in | Wt 109.0 lb

## 2022-10-02 DIAGNOSIS — R062 Wheezing: Secondary | ICD-10-CM | POA: Diagnosis not present

## 2022-10-02 DIAGNOSIS — I10 Essential (primary) hypertension: Secondary | ICD-10-CM

## 2022-10-02 MED ORDER — ALBUTEROL SULFATE HFA 108 (90 BASE) MCG/ACT IN AERS: 2.0000 | INHALATION_SPRAY | Freq: Four times a day (QID) | RESPIRATORY_TRACT | 3 refills | Status: DC | PRN

## 2022-10-02 MED ORDER — AMLODIPINE BESYLATE 5 MG PO TABS: 5.0000 mg | ORAL_TABLET | Freq: Every day | ORAL | 1 refills | Status: DC

## 2022-10-02 NOTE — Assessment & Plan Note (Addendum)
Vitals:   10/02/22 1000 10/02/22 1003  BP: (!) 151/92 132/78    Blood pressure not controlled. Continue telmisartan-HCTZ 40-12.5 mg Prescribed Norvasc 5 mg Follow up in 4 weeks with blood pressure logs Advise lifestyle modifications follow diet low in saturated fat, reduce dietary salt intake, avoid fatty foods, maintain an exercise routine 3 to 5 days a week for a minimum total of 150 minutes.

## 2022-10-02 NOTE — Patient Instructions (Signed)
It was pleasure meeting with you today. Please take medications as prescribed. Follow up with your primary health provider if any health concerns arises.

## 2022-10-02 NOTE — Progress Notes (Signed)
Patient Office Visit   Subjective   Patient ID: Jasmine Davies, female    DOB: Jan 08, 1966  Age: 57 y.o. MRN: [id number]  CC:  Chief Complaint  Patient presents with   Hypertension    Patient is here for HTN f/u. States she feels better since being on the medication     HPI Jasmine Davies 57 year old female, presents to clinic for hypertension follow up. She  has a past medical history of Chronic back pain, Chronic pain in left foot, Cleft foot (L foot), Degenerative joint disease, Depression (2003), Hernia, hiatal, Hiatal hernia, Hypertension, PUD (peptic ulcer disease), and Sciatica.  Patient here for follow-up of elevated blood pressure. She is not exercising and is adherent to low salt diet.  Blood pressure is not well controlled at home. Patient denies cardiac symptoms chest pain, chest pressure/discomfort, dyspnea, fatigue, lower extremity edema, palpitations, and syncope.  Cardiovascular risk factors: hypertension, sedentary lifestyle, and smoking/ tobacco exposure.     Outpatient Encounter Medications as of 10/02/2022  Medication Sig   albuterol (VENTOLIN HFA) 108 (90 Base) MCG/ACT inhaler Inhale 2 puffs into the lungs every 6 (six) hours as needed for wheezing or shortness of breath.   amLODipine (NORVASC) 5 MG tablet Take 1 tablet (5 mg total) by mouth daily.   aspirin EC 81 MG tablet Take 81 mg by mouth daily.   azithromycin (ZITHROMAX) 250 MG tablet Take 1 tablet (250 mg total) by mouth daily. Take first 2 tablets together, then 1 every day until finished. (Patient taking differently: Take 250 mg by mouth daily. Take first 2 tablets together, then 1 every day until finished.)   citalopram (CELEXA) 20 MG tablet TAKE 1 Tablet BY MOUTH ONCE EVERY DAY   citalopram (CELEXA) 20 MG tablet TAKE ONE (1) TABLET EACH DAY   predniSONE (DELTASONE) 50 MG tablet 1 tablet PO QD X4 days   telmisartan-hydrochlorothiazide (MICARDIS HCT) 40-12.5 MG tablet Take 1 tablet by mouth daily.    [DISCONTINUED] albuterol (VENTOLIN HFA) 108 (90 Base) MCG/ACT inhaler Inhale 2 puffs into the lungs every 4 (four) hours as needed for wheezing or shortness of breath.   [DISCONTINUED] metoprolol tartrate (LOPRESSOR) 50 MG tablet Take 1 tablet (50 mg total) by mouth 2 (two) times daily.   [DISCONTINUED] metoprolol tartrate (LOPRESSOR) 50 MG tablet Take 1 tablet (50 mg total) by mouth 2 (two) times daily.   No facility-administered encounter medications on file as of 10/02/2022.    Past Surgical History:  Procedure Laterality Date   CESAREAN SECTION     x2   CHOLECYSTECTOMY  1993   CLUB FOOT RELEASE     left foot   ESOPHAGOGASTRODUODENOSCOPY  10/06/2011   ulcers,multiple in antrum and duodenitis/hiatal hernia   ESOPHAGOGASTRODUODENOSCOPY  12/15/2011   SLF: Moderate gastritis/ Hiatal hernia/PUD IMPROVED BUT NOT RESOLVED DUE TO ONGOING ASA USE   TUBAL LIGATION  1999    Review of Systems  Constitutional:  Negative for chills and fever.  Respiratory:  Positive for wheezing. Negative for cough and shortness of breath.   Cardiovascular:  Negative for chest pain and palpitations.  Neurological:  Negative for dizziness and headaches.      Objective    BP 132/78   Pulse 88   Ht '4\' 11"'$  (1.499 m)   Wt 109 lb (49.4 kg)   SpO2 97%   BMI 22.02 kg/m   Physical Exam Cardiovascular:     Rate and Rhythm: Normal rate and regular rhythm.  Pulses: Normal pulses.  Pulmonary:     Effort: Pulmonary effort is normal.     Breath sounds: Wheezing present.  Musculoskeletal:        General: Normal range of motion.     Right lower leg: No edema.     Left lower leg: No edema.  Skin:    General: Skin is warm and dry.     Capillary Refill: Capillary refill takes less than 2 seconds.  Neurological:     General: No focal deficit present.     Mental Status: She is alert.  Psychiatric:        Thought Content: Thought content normal.       Assessment & Plan:  Primary hypertension Assessment &  Plan: Vitals:   10/02/22 1000 10/02/22 1003  BP: (!) 151/92 132/78    Blood pressure not controlled. Continue telmisartan-HCTZ 40-12.5 mg Prescribed Norvasc 5 mg Follow up in 4 weeks with blood pressure logs Advise lifestyle modifications follow diet low in saturated fat, reduce dietary salt intake, avoid fatty foods, maintain an exercise routine 3 to 5 days a week for a minimum total of 150 minutes.   Orders: -     amLODIPine Besylate; Take 1 tablet (5 mg total) by mouth daily.  Dispense: 30 tablet; Refill: 1  Wheezing -     Albuterol Sulfate HFA; Inhale 2 puffs into the lungs every 6 (six) hours as needed for wheezing or shortness of breath.  Dispense: 8 g; Refill: 3    Return in about 4 weeks (around 10/30/2022) for hypertension, re-check blood pressure, bmp labs.   Renard Hamper Ria Comment, FNP

## 2022-10-04 LAB — FECAL OCCULT BLOOD, IMMUNOCHEMICAL: Fecal Occult Bld: NEGATIVE

## 2022-10-30 ENCOUNTER — Encounter: Payer: Self-pay | Admitting: Family Medicine

## 2022-10-30 ENCOUNTER — Ambulatory Visit (INDEPENDENT_AMBULATORY_CARE_PROVIDER_SITE_OTHER): Payer: Commercial Managed Care - HMO | Admitting: Family Medicine

## 2022-10-30 VITALS — BP 130/73 | HR 84 | Ht 59.0 in | Wt 113.0 lb

## 2022-10-30 DIAGNOSIS — Z8709 Personal history of other diseases of the respiratory system: Secondary | ICD-10-CM | POA: Diagnosis not present

## 2022-10-30 DIAGNOSIS — Z1231 Encounter for screening mammogram for malignant neoplasm of breast: Secondary | ICD-10-CM

## 2022-10-30 DIAGNOSIS — I10 Essential (primary) hypertension: Secondary | ICD-10-CM | POA: Diagnosis not present

## 2022-10-30 MED ORDER — TELMISARTAN-HCTZ 40-12.5 MG PO TABS: 1.0000 | ORAL_TABLET | Freq: Every day | ORAL | 1 refills | Status: DC

## 2022-10-30 MED ORDER — AMLODIPINE BESYLATE 5 MG PO TABS: 5.0000 mg | ORAL_TABLET | Freq: Every day | ORAL | 1 refills | Status: DC

## 2022-10-30 MED ORDER — ANORO ELLIPTA 62.5-25 MCG/ACT IN AEPB: 1.0000 | INHALATION_SPRAY | Freq: Every day | RESPIRATORY_TRACT | 3 refills | Status: DC

## 2022-10-30 NOTE — Assessment & Plan Note (Addendum)
Started patient on Anoro Ellipta inhaler Encouraged non pharmacological interventions such as avoiding allergens, smoking cessation. having albuterol inhaler handy at all times. Patient/parent verbalizes understanding regarding plan of care and all questions answered.  Referral to pulmonology for further evaluation

## 2022-10-30 NOTE — Progress Notes (Signed)
Patient Office Visit   Subjective   Patient ID: Jasmine Davies, female    DOB: 10/21/1965  Age: 57 y.o. MRN: [id number]  CC:  Chief Complaint  Patient presents with   Hypertension    Patient is here for HTN f/u. No changes or concerns since last visit.    Cough    Patient complains of productive cough since being diagnosed with COPD earlier this year. Does not see pulmonology.     HPI Jasmine Davies 57 year old female presents to the clinic for hypertension and COPD managment. She  has a past medical history of Chronic back pain, Chronic pain in left foot, Cleft foot (L foot), Degenerative joint disease, Depression (2003), Hernia, hiatal, Hiatal hernia, Hypertension, PUD (peptic ulcer disease), and Sciatica.  COPD History Patient complains of dyspnea, cough, wheezing, fatigue, and increased sputum. Symptoms began several years ago. Symptoms chronic dyspnea and non-productive cough does not worsen with exertion. Sputum is clear and thick in moderate amounts. Patient reports taking a break when walking to catch her breath at times. Patient uses 1-2  pillows at night. Patient currently is not on home oxygen therapy. Respiratory history: COPD   Hypertension Patient blood pressure has improved since our last visit. Pertinent negatives include no blurred vision. Risk factors for coronary artery disease include smoking/tobacco exposure and stress. Past treatments include angiotensin blockers and calcium channel blockers. The current treatment provides significant improvement. Compliance problems include diet and smoking.       Outpatient Encounter Medications as of 10/30/2022  Medication Sig   albuterol (VENTOLIN HFA) 108 (90 Base) MCG/ACT inhaler Inhale 2 puffs into the lungs every 6 (six) hours as needed for wheezing or shortness of breath.   aspirin EC 81 MG tablet Take 81 mg by mouth daily.   azithromycin (ZITHROMAX) 250 MG tablet Take 1 tablet (250 mg total) by mouth daily. Take first 2  tablets together, then 1 every day until finished. (Patient taking differently: Take 250 mg by mouth daily. Take first 2 tablets together, then 1 every day until finished.)   citalopram (CELEXA) 20 MG tablet TAKE 1 Tablet BY MOUTH ONCE EVERY DAY   citalopram (CELEXA) 20 MG tablet TAKE ONE (1) TABLET EACH DAY   predniSONE (DELTASONE) 50 MG tablet 1 tablet PO QD X4 days   umeclidinium-vilanterol (ANORO ELLIPTA) 62.5-25 MCG/ACT AEPB Inhale 1 puff into the lungs daily.   [DISCONTINUED] amLODipine (NORVASC) 5 MG tablet Take 1 tablet (5 mg total) by mouth daily.   [DISCONTINUED] telmisartan-hydrochlorothiazide (MICARDIS HCT) 40-12.5 MG tablet Take 1 tablet by mouth daily.   amLODipine (NORVASC) 5 MG tablet Take 1 tablet (5 mg total) by mouth daily.   telmisartan-hydrochlorothiazide (MICARDIS HCT) 40-12.5 MG tablet Take 1 tablet by mouth daily.   No facility-administered encounter medications on file as of 10/30/2022.    Past Surgical History:  Procedure Laterality Date   CESAREAN SECTION     x2   CHOLECYSTECTOMY  1993   CLUB FOOT RELEASE     left foot   ESOPHAGOGASTRODUODENOSCOPY  10/06/2011   ulcers,multiple in antrum and duodenitis/hiatal hernia   ESOPHAGOGASTRODUODENOSCOPY  12/15/2011   SLF: Moderate gastritis/ Hiatal hernia/PUD IMPROVED BUT NOT RESOLVED DUE TO ONGOING ASA USE   TUBAL LIGATION  1999    Review of Systems  Constitutional:  Negative for chills and fever.  Eyes:  Negative for blurred vision.  Respiratory:  Positive for cough, sputum production and shortness of breath.   Cardiovascular:  Negative for chest  pain.  Genitourinary:  Negative for dysuria.  Musculoskeletal:  Negative for myalgias.  Neurological:  Negative for dizziness and headaches.      Objective    BP 130/73   Pulse 84   Ht 4\' 11"  (1.499 m)   Wt 113 lb (51.3 kg)   SpO2 98%   BMI 22.82 kg/m   Physical Exam HENT:     Head: Normocephalic.  Cardiovascular:     Rate and Rhythm: Normal rate.      Pulses: Normal pulses.  Pulmonary:     Effort: Pulmonary effort is normal.     Breath sounds: Normal breath sounds.  Musculoskeletal:        General: Normal range of motion.  Skin:    General: Skin is warm and dry.     Capillary Refill: Capillary refill takes less than 2 seconds.  Neurological:     General: No focal deficit present.     Mental Status: She is alert.  Psychiatric:        Mood and Affect: Mood normal.       Assessment & Plan:  Primary hypertension Assessment & Plan: Vitals:   10/30/22 0916 10/30/22 0950  BP: 132/71 130/73  Blood pressure controlled, Labs ordered. Continue Telmisartan-HCTZ 40-12 mg and amlodipine 5 mg daily.   Orders: -     Basic metabolic panel -     amLODIPine Besylate; Take 1 tablet (5 mg total) by mouth daily.  Dispense: 90 tablet; Refill: 1 -     Telmisartan-HCTZ; Take 1 tablet by mouth daily.  Dispense: 90 tablet; Refill: 1  Encounter for screening mammogram for malignant neoplasm of breast -     Digital Screening Mammogram, Left and Right; Future  History of COPD Assessment & Plan: Started patient on Anoro Ellipta inhaler Encouraged non pharmacological interventions such as avoiding allergens, smoking cessation. having albuterol inhaler handy at all times. Patient/parent verbalizes understanding regarding plan of care and all questions answered.  Referral to pulmonology for further evaluation   Orders: -     Ambulatory referral to Pulmonology -     [place]; Inhale 1 puff into the lungs daily.  Dispense: 60 each; Refill: 3    Return in about 1 month (around 11/30/2022) for Pap smear.   Renard Hamper Ria Comment, FNP

## 2022-10-30 NOTE — Patient Instructions (Signed)
It was pleasure meeting with you today. Please take medications as prescribed. Follow up with your primary health provider if any health concerns arises.  

## 2022-10-30 NOTE — Assessment & Plan Note (Addendum)
Vitals:   10/30/22 0916 10/30/22 0950  BP: 132/71 130/73  Blood pressure controlled, Labs ordered. Continue Telmisartan-HCTZ 40-12 mg and amlodipine 5 mg daily.

## 2022-10-31 LAB — BASIC METABOLIC PANEL
BUN/Creatinine Ratio: 12 (ref 9–23)
BUN: 9 mg/dL (ref 6–24)
CO2: 27 mmol/L (ref 20–29)
Calcium: 9.3 mg/dL (ref 8.7–10.2)
Chloride: 91 mmol/L — ABNORMAL LOW (ref 96–106)
Creatinine, Ser: 0.73 mg/dL (ref 0.57–1.00)
Glucose: 112 mg/dL — ABNORMAL HIGH (ref 70–99)
Potassium: 3.4 mmol/L — ABNORMAL LOW (ref 3.5–5.2)
Sodium: 135 mmol/L (ref 134–144)
eGFR: 98 mL/min/{1.73_m2} (ref >59)

## 2022-11-05 ENCOUNTER — Encounter (HOSPITAL_COMMUNITY): Payer: Self-pay

## 2022-11-05 ENCOUNTER — Ambulatory Visit (HOSPITAL_COMMUNITY)
Admission: RE | Admit: 2022-11-05 | Discharge: 2022-11-05 | Disposition: A | Discharge status: Home / Self Care | Payer: Commercial Managed Care - HMO | Source: Ambulatory Visit | Attending: Family Medicine | Admitting: Family Medicine

## 2022-11-05 DIAGNOSIS — Z1231 Encounter for screening mammogram for malignant neoplasm of breast: Secondary | ICD-10-CM | POA: Insufficient documentation

## 2022-11-25 ENCOUNTER — Encounter: Payer: Self-pay | Admitting: Family Medicine

## 2022-11-27 ENCOUNTER — Other Ambulatory Visit: Payer: Self-pay

## 2022-11-27 DIAGNOSIS — I10 Essential (primary) hypertension: Secondary | ICD-10-CM

## 2022-11-29 NOTE — Progress Notes (Unsigned)
Abbie Sons, female    DOB: July 05, 1966    MRN: [id number]   Brief patient profile:  57  yowf  active smoker referred to pulmonary clinic in [place]  11/30/2022 by Gildardo Griffes  for copd with onset of doe 2023      History of Present Illness  11/30/2022  Pulmonary/ 1st office eval/ Angeline Trick / [facility]  Chief Complaint  Patient presents with   Consult    Pt consult for COPD she verbalized that she was dx 6 months ago. She is a current smoker using 0.5ppd. Denies using O2 or never had PFT   Dyspnea:  Only if walks uphill = MMRC1 = can walk nl pace, flat grade, can't hurry or go uphills or steps s sob   Cough: daily x one year thick white worse in am/ clears p abuterol or an hour s it. Sleep: flat bed / one pillow  SABA use: only in am 02: none  Lung cancer screening rec   No obvious day to day or daytime pattern/variability or assoc excess/ purulent sputum or mucus plugs or hemoptysis or cp or chest tightness, subjective wheeze or overt sinus or hb symptoms.   Sleeping  without nocturnal   exacerbation  of respiratory  c/o's or need for noct saba. Also denies any obvious fluctuation of symptoms with weather or environmental changes or other aggravating or alleviating factors except as outlined above   No unusual exposure hx or h/o childhood pna/ asthma or knowledge of premature birth.  Current Allergies, Complete Past Medical History, Past Surgical History, Family History, and Social History were reviewed in [employer] record.  ROS  The following are not active complaints unless bolded Hoarseness, sore throat, dysphagia, dental problems, itching, sneezing,  nasal congestion or discharge of excess mucus or purulent secretions, ear ache,   fever, chills, sweats, unintended wt loss or wt gain, classically pleuritic or exertional cp,  orthopnea pnd or arm/hand swelling  or leg swelling, presyncope, palpitations, abdominal pain, anorexia, nausea, vomiting,  diarrhea  or change in bowel habits or change in bladder habits, change in stools or change in urine, dysuria, hematuria,  rash, arthralgias, visual complaints, headache, numbness, weakness or ataxia or problems with walking or coordination,  change in mood or  memory.             Past Medical History:  Diagnosis Date   Chronic back pain    Chronic pain in left foot    Cleft foot L foot   Degenerative joint disease    Depression 2003   Hernia, hiatal    Hiatal hernia    Hypertension    PUD (peptic ulcer disease)    ? ETOH and ASA use   Sciatica     Outpatient Medications Prior to Visit  Medication Sig Dispense Refill   albuterol (VENTOLIN HFA) 108 (90 Base) MCG/ACT inhaler Inhale 2 puffs into the lungs every 6 (six) hours as needed for wheezing or shortness of breath. 8 g 3   amLODipine (NORVASC) 5 MG tablet Take 1 tablet (5 mg total) by mouth daily. 90 tablet 1   aspirin EC 81 MG tablet Take 81 mg by mouth daily.     citalopram (CELEXA) 20 MG tablet TAKE 1 Tablet BY MOUTH ONCE EVERY DAY 90 tablet 0   citalopram (CELEXA) 20 MG tablet TAKE ONE (1) TABLET EACH DAY 30 tablet 0   telmisartan-hydrochlorothiazide (MICARDIS HCT) 40-12.5 MG tablet Take 1 tablet by mouth daily.  90 tablet 1                           Objective:     BP 136/73   Pulse 82   Ht  (1.499 m)   Wt 110 lb (49.9 kg)   SpO2 97%   BMI 22.22 kg/m   SpO2: 97 %  Amb wf > stated age with gravely voice and prominent pw better with plb   HEENT : Oropharynx  clear   Nasal turbinates nl    NECK :  without  apparent JVD/ palpable Nodes/TM    LUNGS: no acc muscle use,  Min barrel  contour chest wall with bilateral pan exp rhonchi vs transmitted bs from upper airway  and  without cough on insp or exp maneuvers and min  Hyperresonant  to  percussion bilaterally    CV:  RRR  no s3 or murmur or increase in P2, and no edema   ABD:  soft and nontender with pos end  insp Hoover's  in the supine position.   No bruits or organomegaly appreciated   MS:  Nl gait/ ext warm without deformities Or obvious joint restrictions  calf tenderness, cyanosis or clubbing     SKIN: warm and dry without lesions    NEURO:  alert, approp, nl sensorium with  no motor or cerebellar deficits apparent.            I personally reviewed images and agree with radiology impression as follows:  CXR:   08/21/22 pa and lateral Wnl  My review: ? Mild copd    Assessment   Cigarette smoker Low-dose CT lung cancer screening is recommended for patients who are 65-42 years of age with a 20+ pack-year history of smoking and who are currently smoking or quit <=15 years ago. No coughing up blood  No unintentional weight loss of > 15 pounds in the last 6 months - pt is eligible for scanning yearly until 15 y p quits smokin g> referred    4-5 min discussion re active cigarette smoking in addition to office E&M  Ask about tobacco use:   ongoing Advise quitting   I took an extended  opportunity with this patient to outline the consequences of continued cigarette use  in airway disorders based on all the data we have from the multiple national lung health studies (perfomed over decades at millions of dollars in cost)  indicating that smoking cessation,  is the most important aspect of her care.   Assess willingness:  Not committed at this point Assist in quit attempt:  Per PCP when ready Arrange follow up:   Follow up per Primary Care planned        COPD ? gold/ CB Active smoker - 11/30/2022  After extensive coaching inhaler device,  effectiveness =    50% > continue prn saba  - Labs ordered 11/30/2022  :  allergy profile   alpha one AT phenotype   - PFT's ordered  Pt is Group A in terms of symptom/risk and  sama or saba or sama/saba  therefore appropriate rx at this point >>>  continue albuterol but work on technique      I reviewed the [employer] curve with the patient that basically indicates  if you quit smoking  when your best day FEV1 is still well preserved (as is likely  the case here)  it is highly unlikely you will progress to severe disease  and informed the patient there was  no medication on the market that has proven to alter the curve/ its downward trajectory  or the likelihood of progression of their disease(unlike other chronic medical conditions such as atheroclerosis where we do think we can change the natural hx with risk reducing meds)    Therefore stopping smoking and maintaining abstinence are  the most important aspects of care, not choice of inhalers or for that matter, pulmonary doctors.   Treatment other than smoking cessation  is entirely directed by severity of symptoms and focused also on reducing exacerbations, not attempting to change the natural history of the disease.  Should she note decline in ex tol or freq exac she should return to office           Each maintenance medication was reviewed in detail including emphasizing most importantly the difference between maintenance and prns and under what circumstances the prns are to be triggered using an action plan format where appropriate.  Total time for H and P, chart review, counseling, reviewing when to use inhaler  device(s) and generating customized AVS unique to this office visit / same day charting <> 45 min new pt eval           Sandrea Hughs, MD 11/30/2022

## 2022-11-30 ENCOUNTER — Ambulatory Visit (INDEPENDENT_AMBULATORY_CARE_PROVIDER_SITE_OTHER): Payer: Commercial Managed Care - HMO | Admitting: Internal Medicine

## 2022-11-30 ENCOUNTER — Encounter: Payer: Self-pay | Admitting: Internal Medicine

## 2022-11-30 VITALS — BP 136/73 | HR 82 | Ht 59.0 in | Wt 110.0 lb

## 2022-11-30 DIAGNOSIS — F17210 Nicotine dependence, cigarettes, uncomplicated: Secondary | ICD-10-CM

## 2022-11-30 DIAGNOSIS — J449 Chronic obstructive pulmonary disease, unspecified: Secondary | ICD-10-CM | POA: Diagnosis not present

## 2022-11-30 MED ORDER — ALBUTEROL SULFATE (2.5 MG/3ML) 0.083% IN NEBU: INHALATION_SOLUTION | RESPIRATORY_TRACT | Status: DC

## 2022-11-30 NOTE — Assessment & Plan Note (Signed)
Low-dose CT lung cancer screening is recommended for patients who are 66-55 years of age with a 20+ pack-year history of smoking and who are currently smoking or quit <=15 years ago. No coughing up blood  No unintentional weight loss of > 15 pounds in the last 6 months - pt is eligible for scanning yearly until 15 y p quits smokin g> referred    4-5 min discussion re active cigarette smoking in addition to office E&M  Ask about tobacco use:   ongoing Advise quitting   I took an extended  opportunity with this patient to outline the consequences of continued cigarette use  in airway disorders based on all the data we have from the multiple national lung health studies (perfomed over decades at millions of dollars in cost)  indicating that smoking cessation,  is the most important aspect of her care.   Assess willingness:  Not committed at this point Assist in quit attempt:  Per PCP when ready Arrange follow up:   Follow up per Primary Care planned

## 2022-11-30 NOTE — Patient Instructions (Addendum)
The key is to stop smoking completely before smoking completely stops you!  For cough > mucinex or mucinex dm up to 1200 mg every 12 hours as needed (over the counter)  For breathing or cough/ congestion >  albuterol 2 puffs up to every 4hours or  nebulizer  up to every 4hours as needed but if use goes up over normal level need to be seen here or by PCP asap   Please remember to go to the lab department   for your tests - we will call you with the results when they are available.      My office will be contacting you by phone for referral to PFTs next available  - if you don't hear back from my office within one week please call us back or notify us thru MyChart and we'll address it right away.   Follow up here is as needed   Late add:  referred for lung cancer screening

## 2022-12-01 ENCOUNTER — Encounter: Payer: Self-pay | Admitting: Internal Medicine

## 2022-12-01 NOTE — Assessment & Plan Note (Addendum)
Active smoker - 11/30/2022  After extensive coaching inhaler device,  effectiveness =    50% > continue prn saba  - Labs ordered 11/30/2022  :  allergy profile   alpha one AT phenotype   - PFT's ordered  Pt is Group A in terms of symptom/risk and  sama or saba or sama/saba  therefore appropriate rx at this point >>>  continue albuterol but work on technique      I reviewed the [employer] curve with the patient that basically indicates  if you quit smoking when your best day FEV1 is still well preserved (as is likely  the case here)  it is highly unlikely you will progress to severe disease and informed the patient there was  no medication on the market that has proven to alter the curve/ its downward trajectory  or the likelihood of progression of their disease(unlike other chronic medical conditions such as atheroclerosis where we do think we can change the natural hx with risk reducing meds)    Therefore stopping smoking and maintaining abstinence are  the most important aspects of care, not choice of inhalers or for that matter, pulmonary doctors.   Treatment other than smoking cessation  is entirely directed by severity of symptoms and focused also on reducing exacerbations, not attempting to change the natural history of the disease.  Should she note decline in ex tol or freq exac she should return to office           Each maintenance medication was reviewed in detail including emphasizing most importantly the difference between maintenance and prns and under what circumstances the prns are to be triggered using an action plan format where appropriate.  Total time for H and P, chart review, counseling, reviewing when to use inhaler  device(s) and generating customized AVS unique to this office visit / same day charting <> 45 min new pt eval

## 2022-12-03 ENCOUNTER — Encounter: Payer: Self-pay | Admitting: Family Medicine

## 2022-12-04 ENCOUNTER — Ambulatory Visit: Payer: Commercial Managed Care - HMO | Admitting: Family Medicine

## 2022-12-11 ENCOUNTER — Encounter: Payer: Self-pay | Admitting: Family Medicine

## 2022-12-11 ENCOUNTER — Telehealth: Payer: Self-pay | Admitting: Family Medicine

## 2022-12-11 ENCOUNTER — Ambulatory Visit (INDEPENDENT_AMBULATORY_CARE_PROVIDER_SITE_OTHER): Payer: Self-pay | Admitting: Family Medicine

## 2022-12-11 ENCOUNTER — Other Ambulatory Visit (HOSPITAL_COMMUNITY)
Admission: RE | Admit: 2022-12-11 | Discharge: 2022-12-11 | Disposition: A | Discharge status: Home / Self Care | Payer: Self-pay | Source: Ambulatory Visit | Attending: Family Medicine | Admitting: Family Medicine

## 2022-12-11 VITALS — BP 125/76 | HR 82 | Ht 59.0 in | Wt 106.0 lb

## 2022-12-11 DIAGNOSIS — Z124 Encounter for screening for malignant neoplasm of cervix: Secondary | ICD-10-CM | POA: Insufficient documentation

## 2022-12-11 NOTE — Assessment & Plan Note (Signed)
Pap smear done, Patient tolerated procedure well. Informed patient I will keep them updated on results. Left and right breasts appear normal, no suspicious masses, no skin or nipple changes or axillary nodes.   

## 2022-12-11 NOTE — Patient Instructions (Signed)

## 2022-12-11 NOTE — Progress Notes (Signed)
Patient Office Visit   Subjective   Patient ID: Jasmine Davies, female    DOB: 1965/11/24  Age: 57 y.o. MRN: [id number]  CC:  Chief Complaint  Patient presents with   Annual Exam    HPI Jasmine Davies 57 year old female, presents to the clinic for pap smear. She  has a past medical history of Chronic back pain, Chronic pain in left foot, Cleft foot (L foot), Degenerative joint disease, Depression (2003), Hernia, hiatal, Hiatal hernia, Hypertension, PUD (peptic ulcer disease), and Sciatica. For the details of today's visit, please refer to assessment and plan.   HPI    Outpatient Encounter Medications as of 12/11/2022  Medication Sig   albuterol (PROVENTIL) (2.5 MG/3ML) 0.083% nebulizer solution Use up to every 4 hours if needed   albuterol (VENTOLIN HFA) 108 (90 Base) MCG/ACT inhaler Inhale 2 puffs into the lungs every 6 (six) hours as needed for wheezing or shortness of breath.   amLODipine (NORVASC) 5 MG tablet Take 1 tablet (5 mg total) by mouth daily.   aspirin EC 81 MG tablet Take 81 mg by mouth daily.   citalopram (CELEXA) 20 MG tablet TAKE 1 Tablet BY MOUTH ONCE EVERY DAY   citalopram (CELEXA) 20 MG tablet TAKE ONE (1) TABLET EACH DAY   telmisartan-hydrochlorothiazide (MICARDIS HCT) 40-12.5 MG tablet Take 1 tablet by mouth daily.   No facility-administered encounter medications on file as of 12/11/2022.    Past Surgical History:  Procedure Laterality Date   CESAREAN SECTION     x2   CHOLECYSTECTOMY  1993   CLUB FOOT RELEASE     left foot   ESOPHAGOGASTRODUODENOSCOPY  10/06/2011   ulcers,multiple in antrum and duodenitis/hiatal hernia   ESOPHAGOGASTRODUODENOSCOPY  12/15/2011   SLF: Moderate gastritis/ Hiatal hernia/PUD IMPROVED BUT NOT RESOLVED DUE TO ONGOING ASA USE   TUBAL LIGATION  1999    Review of Systems  Constitutional:  Negative for chills and fever.  Eyes:  Negative for blurred vision.  Respiratory:  Negative for shortness of breath.   Cardiovascular:  Negative  for chest pain.  Genitourinary:  Negative for dysuria, flank pain, frequency, hematuria and urgency.  Neurological:  Negative for dizziness and headaches.      Objective    BP 125/76   Pulse 82   Ht 4\' 11"  (1.499 m)   Wt 106 lb (48.1 kg)   SpO2 98%   BMI 21.41 kg/m   Physical Exam Vitals reviewed.  Constitutional:      General: She is not in acute distress.    Appearance: Normal appearance. She is not ill-appearing, toxic-appearing or diaphoretic.  HENT:     Head: Normocephalic.  Eyes:     General:        Right eye: No discharge.        Left eye: No discharge.     Conjunctiva/sclera: Conjunctivae normal.  Cardiovascular:     Rate and Rhythm: Normal rate.     Pulses: Normal pulses.     Heart sounds: Normal heart sounds.  Pulmonary:     Effort: Pulmonary effort is normal. No respiratory distress.     Breath sounds: Normal breath sounds.  Abdominal:     Hernia: There is no hernia in the left inguinal area or right inguinal area.  Genitourinary:    General: Normal vulva.     Exam position: Lithotomy position.     Pubic Area: No rash or pubic lice.      Labia:  Right: No rash.        Left: No rash.      Vagina: Normal. No foreign body. No vaginal discharge, erythema, tenderness or bleeding.     Cervix: Normal.  Musculoskeletal:        General: Normal range of motion.     Cervical back: Normal range of motion.  Skin:    General: Skin is warm and dry.     Capillary Refill: Capillary refill takes less than 2 seconds.  Neurological:     General: No focal deficit present.     Mental Status: She is alert and oriented to person, place, and time.     Coordination: Coordination normal.     Gait: Gait normal.  Psychiatric:        Mood and Affect: Mood normal.        Behavior: Behavior normal.        Thought Content: Thought content normal.        Judgment: Judgment normal.       Assessment & Plan:  Encounter for Papanicolaou smear of cervix Assessment &  Plan: Pap smear done, Patient tolerated procedure well. Informed patient I will keep them updated on results. Left and right breasts appear normal, no suspicious masses, no skin or nipple changes or axillary nodes.     No follow-ups on file.   Cruzita Lederer Newman Nip, FNP

## 2022-12-14 NOTE — Telephone Encounter (Signed)
Called, unable to leave VM

## 2022-12-16 LAB — CYTOLOGY - PAP
Diagnosis: NEGATIVE
Diagnosis: REACTIVE

## 2022-12-16 NOTE — Progress Notes (Signed)
Please inform patient,  Pap smear results normal, need to schedule chronic follow up appointment in 3 months

## 2023-01-01 NOTE — Telephone Encounter (Signed)
Called patient unable to leave voice mail, sent a mychart message also.

## 2023-01-20 ENCOUNTER — Emergency Department (HOSPITAL_COMMUNITY): Admission: EM | Admit: 2023-01-20 | Discharge: 2023-01-20 | Discharge status: Against Medical Advice | Payer: Self-pay

## 2023-01-20 NOTE — ED Notes (Signed)
Pt is refusing any care or treatment at this time.  She wants to leave and is trying to find a ride

## 2023-01-20 NOTE — ED Notes (Signed)
Pt friend Missy took patient home.

## 2023-01-27 ENCOUNTER — Emergency Department (HOSPITAL_COMMUNITY)
Admission: EM | Admit: 2023-01-27 | Discharge: 2023-01-27 | Disposition: A | Discharge status: Home / Self Care | Payer: Self-pay | Attending: Emergency Medicine | Admitting: Emergency Medicine

## 2023-01-27 ENCOUNTER — Other Ambulatory Visit: Payer: Self-pay

## 2023-01-27 ENCOUNTER — Emergency Department (HOSPITAL_COMMUNITY): Payer: Self-pay

## 2023-01-27 DIAGNOSIS — I10 Essential (primary) hypertension: Secondary | ICD-10-CM | POA: Insufficient documentation

## 2023-01-27 DIAGNOSIS — D72829 Elevated white blood cell count, unspecified: Secondary | ICD-10-CM | POA: Insufficient documentation

## 2023-01-27 DIAGNOSIS — Z79899 Other long term (current) drug therapy: Secondary | ICD-10-CM | POA: Insufficient documentation

## 2023-01-27 DIAGNOSIS — M25531 Pain in right wrist: Secondary | ICD-10-CM | POA: Insufficient documentation

## 2023-01-27 DIAGNOSIS — M79644 Pain in right finger(s): Secondary | ICD-10-CM | POA: Insufficient documentation

## 2023-01-27 DIAGNOSIS — Z7982 Long term (current) use of aspirin: Secondary | ICD-10-CM | POA: Insufficient documentation

## 2023-01-27 LAB — CBC WITH DIFFERENTIAL/PLATELET
Abs Immature Granulocytes: 0.05 10*3/uL (ref 0.00–0.07)
Basophils Absolute: 0.1 10*3/uL (ref 0.0–0.1)
Basophils Relative: 1 %
Eosinophils Absolute: 0 10*3/uL (ref 0.0–0.5)
Eosinophils Relative: 0 %
HCT: 41 % (ref 36.0–46.0)
Hemoglobin: 14 g/dL (ref 12.0–15.0)
Immature Granulocytes: 0 %
Lymphocytes Relative: 12 %
Lymphs Abs: 1.5 10*3/uL (ref 0.7–4.0)
MCH: 34.6 pg — ABNORMAL HIGH (ref 26.0–34.0)
MCHC: 34.1 g/dL (ref 30.0–36.0)
MCV: 101.2 fL — ABNORMAL HIGH (ref 80.0–100.0)
Monocytes Absolute: 1.2 10*3/uL — ABNORMAL HIGH (ref 0.1–1.0)
Monocytes Relative: 9 %
Neutro Abs: 10.3 10*3/uL — ABNORMAL HIGH (ref 1.7–7.7)
Neutrophils Relative %: 78 %
Platelets: 289 10*3/uL (ref 150–400)
RBC: 4.05 MIL/uL (ref 3.87–5.11)
RDW: 13.5 % (ref 11.5–15.5)
WBC: 13.2 10*3/uL — ABNORMAL HIGH (ref 4.0–10.5)
nRBC: 0 % (ref 0.0–0.2)

## 2023-01-27 LAB — BASIC METABOLIC PANEL
Anion gap: 10 (ref 5–15)
BUN: 11 mg/dL (ref 6–20)
CO2: 26 mmol/L (ref 22–32)
Calcium: 8.3 mg/dL — ABNORMAL LOW (ref 8.9–10.3)
Chloride: 99 mmol/L (ref 98–111)
Creatinine, Ser: 0.63 mg/dL (ref 0.44–1.00)
GFR, Estimated: >60 mL/min (ref >60)
Glucose, Bld: 103 mg/dL — ABNORMAL HIGH (ref 70–99)
Potassium: 4.4 mmol/L (ref 3.5–5.1)
Sodium: 135 mmol/L (ref 135–145)

## 2023-01-27 LAB — URIC ACID: Uric Acid, Serum: 5.8 mg/dL (ref 2.5–7.1)

## 2023-01-27 MED ORDER — FENTANYL CITRATE PF 50 MCG/ML IJ SOSY
50.0000 ug | PREFILLED_SYRINGE | Freq: Once | INTRAMUSCULAR | Status: AC
  Administered 2023-01-27: 50 ug via INTRAVENOUS
  Filled 2023-01-27: qty 1

## 2023-01-27 MED ORDER — KETOROLAC TROMETHAMINE 30 MG/ML IJ SOLN
15.0000 mg | Freq: Once | INTRAMUSCULAR | Status: AC
  Administered 2023-01-27: 15 mg via INTRAVENOUS
  Filled 2023-01-27: qty 1

## 2023-01-27 MED ORDER — IRBESARTAN 150 MG PO TABS: 150.0000 mg | ORAL_TABLET | Freq: Every day | ORAL | 0 refills | Status: DC

## 2023-01-27 MED ORDER — IRBESARTAN 150 MG PO TABS
150.0000 mg | ORAL_TABLET | Freq: Every day | ORAL | Status: DC
  Administered 2023-01-27: 150 mg via ORAL
  Filled 2023-01-27: qty 1

## 2023-01-27 MED ORDER — AMLODIPINE BESYLATE 5 MG PO TABS: 5.0000 mg | ORAL_TABLET | Freq: Every day | ORAL | 0 refills | Status: DC

## 2023-01-27 MED ORDER — HYDROCODONE-ACETAMINOPHEN 5-325 MG PO TABS: ORAL_TABLET | ORAL | 0 refills | Status: DC

## 2023-01-27 MED ORDER — IBUPROFEN 800 MG PO TABS: 800.0000 mg | ORAL_TABLET | Freq: Three times a day (TID) | ORAL | 0 refills | Status: DC

## 2023-01-27 MED ORDER — AMLODIPINE BESYLATE 5 MG PO TABS
5.0000 mg | ORAL_TABLET | Freq: Once | ORAL | Status: AC
  Administered 2023-01-27: 5 mg via ORAL
  Filled 2023-01-27: qty 1

## 2023-01-27 MED ORDER — FENTANYL CITRATE PF 50 MCG/ML IJ SOSY
25.0000 ug | PREFILLED_SYRINGE | Freq: Once | INTRAMUSCULAR | Status: AC
  Administered 2023-01-27: 25 ug via INTRAVENOUS
  Filled 2023-01-27: qty 1

## 2023-01-27 MED ORDER — GADOBUTROL 1 MMOL/ML IV SOLN
5.0000 mL | Freq: Once | INTRAVENOUS | Status: AC | PRN
  Administered 2023-01-27: 5 mL via INTRAVENOUS

## 2023-01-27 NOTE — ED Provider Notes (Signed)
[facility] AT [facility] Provider Note   CSN: [id number] Arrival date & time: 01/27/23  1031     History  Chief Complaint  Patient presents with   Wrist Pain    Jasmine Davies is a 57 y.o. female.   Wrist Pain Pertinent negatives include no chest pain and no shortness of breath.        Jasmine Davies is a 57 y.o. female with past medical history of hypertension, degenerative joint disease, chronic back pain and chronic pain of left foot who presents to the Emergency Department complaining of pain and swelling of her right hand and right wrist.  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  Began having pain along her proximal right thumb and thenar eminence 2 days ago.  Was applying ice, but last evening she applied heat to her hand.  She woke this morning with increased pain of her thumb that extends into her dorsal hand and proximal wrist.  Pain worsens with movement.  She denies known injury or possible insect bite   Home Medications Prior to Admission medications   Medication Sig Start Date End Date Taking? Authorizing Provider  albuterol (PROVENTIL) (2.5 MG/3ML) 0.083% nebulizer solution Use up to every 4 hours if needed 11/30/22   Nyoka Cowden, MD  albuterol (VENTOLIN HFA) 108 (90 Base) MCG/ACT inhaler Inhale 2 puffs into the lungs every 6 (six) hours as needed for wheezing or shortness of breath. 10/02/22   Del Nigel Berthold, FNP  amLODipine (NORVASC) 5 MG tablet Take 1 tablet (5 mg total) by mouth daily. 10/30/22   Del Nigel Berthold, FNP  aspirin EC 81 MG tablet Take 81 mg by mouth daily.    [provider]  citalopram (CELEXA) 20 MG tablet TAKE 1 Tablet BY MOUTH ONCE EVERY DAY 10/30/20   Jacquelin Hawking, PA-C  citalopram (CELEXA) 20 MG tablet TAKE ONE (1) TABLET EACH DAY 10/30/20   Jacquelin Hawking, PA-C  telmisartan-hydrochlorothiazide (MICARDIS HCT) 40-12.5 MG tablet Take 1  tablet by mouth daily. 10/30/22   Del Nigel Berthold, FNP      Allergies    Penicillins    Review of Systems   Review of Systems  Constitutional:  Negative for appetite change, chills and fever.  Respiratory:  Negative for shortness of breath.   Cardiovascular:  Negative for chest pain.  Gastrointestinal:  Negative for nausea and vomiting.  Musculoskeletal:  Positive for arthralgias and joint swelling. Negative for neck pain.  Skin:  Negative for color change, rash and wound.  Neurological:  Negative for dizziness, weakness and numbness.    Physical Exam Updated Vital Signs BP (!) 183/113 (BP Location: Left Arm)   Pulse 93   Temp 99.3 F (37.4 C) (Oral)   Resp 18   Ht 4\' 11"  (1.499 m)   Wt 48.1 kg   SpO2 96%   BMI 21.41 kg/m  Physical Exam Vitals and nursing note reviewed.  Constitutional:      General: She is not in acute distress.    Appearance: Normal appearance. She is not toxic-appearing.  Cardiovascular:     Rate and Rhythm: Normal rate and regular rhythm.     Pulses: Normal pulses.  Pulmonary:     Effort: Pulmonary effort is normal.  Musculoskeletal:     Right hand: Swelling and tenderness present. No deformity. Decreased range of motion. Normal strength. Normal sensation. There is  no disruption of two-point discrimination. Normal capillary refill. Normal pulse.     Cervical back: Normal range of motion. No tenderness.     Comments: Ttp of the dorsal hand and distal right wrist.  Pain with attempted movement of the right thumb.  Moderate soft tissue swelling of dorsal hand.  No excessive warmth or erythema.  Moves fingers of the right hand w/o difficulty.    Skin:    General: Skin is warm.     Capillary Refill: Capillary refill takes less than 2 seconds.     Findings: No bruising, erythema or rash.  Neurological:     General: No focal deficit present.     Mental Status: She is alert.     Sensory: No sensory deficit.     Motor: No weakness.     ED  Results / Procedures / Treatments   Labs (all labs ordered are listed, but only abnormal results are displayed) Labs Reviewed  CBC WITH DIFFERENTIAL/PLATELET - Abnormal; Notable for the following components:      Result Value   WBC 13.2 (*)    MCV 101.2 (*)    MCH 34.6 (*)    Neutro Abs 10.3 (*)    Monocytes Absolute 1.2 (*)    All other components within normal limits  BASIC METABOLIC PANEL - Abnormal; Notable for the following components:   Glucose, Bld 103 (*)    Calcium 8.3 (*)    All other components within normal limits    EKG None  Radiology MR HAND RIGHT W WO CONTRAST  Result Date: 01/27/2023 CLINICAL DATA:  Hand pain, chronic, inflammatory arthritis suspected. EXAM: MRI OF THE RIGHT HAND WITHOUT AND WITH CONTRAST TECHNIQUE: Multiplanar, multisequence MR imaging of the right hand was performed before and after the administration of intravenous contrast. CONTRAST:  5mL GADAVIST GADOBUTROL 1 MMOL/ML IV SOLN COMPARISON:  None Available. FINDINGS: Wrist findings are dictated separately. Bones/Joint/Cartilage No evidence of acute fracture or dislocation. Mild degenerative changes at the 1st metacarpal phalangeal and carpometacarpal joints. There is a small effusion and mild enhancement at the 1st carpometacarpal joint. No significant interphalangeal arthropathy or other effusions within the hand. Radiocarpal and midcarpal joint effusions with diffuse synovial enhancement are present, further described on separate examination of the wrist. Ligaments The collateral ligaments of the metacarpal phalangeal joints are intact. Muscles and Tendons Diffuse edema and enhancement throughout the flexor and abductor musculature of the thumb. This is associated with some fatty atrophy, and may reflect subacute denervation. No other focal muscular abnormalities are identified. Mild synovial enhancement associated with the flexor and extensor tendons at the wrist without significant extension into the  hand. Soft tissues Diffuse periarticular soft tissue edema and enhancement at the wrist. There is relatively mild extension of this process into the hand, greatest dorsally and around the thumb. No focal fluid collections are identified. Subcutaneous edema extends into the dorsal aspects of the proximal fingers. IMPRESSION: 1. Diffuse periarticular soft tissue edema and enhancement at the wrist with mild extension into the hand, greatest dorsally and around the thumb. No focal fluid collections identified. Based on wrist findings (dictated separately), this could reflect cellulitis. Correlate clinically. 2. Radiocarpal and midcarpal inflammatory arthropathy, dictated separately. Mild involvement of the 1st carpometacarpal joint. Cannot exclude septic arthritis. 3. Diffuse edema and enhancement throughout the flexor and abductor musculature of the thumb may reflect subacute denervation. 4. Mild synovial enhancement associated with the flexor and extensor tendons at the wrist without significant extension into the hand. 5.  No evidence of osteomyelitis. Electronically Signed   By: Carey Bullocks M.D.   On: 01/27/2023 16:08   MR WRIST RIGHT W WO CONTRAST  Result Date: 01/27/2023 CLINICAL DATA:  Wrist pain, chronic, inflammatory arthritis suspected. EXAM: MR OF THE RIGHT WRIST WITHOUT AND WITH CONTRAST TECHNIQUE: Multiplanar multisequence MR imaging of the right wrist was performed both before and after the administration of intravenous contrast. CONTRAST:  5mL GADAVIST GADOBUTROL 1 MMOL/ML IV SOLN COMPARISON:  Radiographs 01/27/2023 FINDINGS: Ligaments: The scapholunate and lunotriquetral ligaments appear intact. Triangular fibrocartilage: The triangular fibrocartilage complex appears intact. There is an ulnar negative variance. Tendons: Mild extensor tenosynovitis. There is mild diffuse synovial enhancement involving the flexor and extensor tendon sheaths. No evidence of tendon tear. Carpal tunnel/median nerve:  Unremarkable. Guyon's canal: Unremarkable. Joint/cartilage: Moderate to large radiocarpal and midcarpal joint effusions with heterogeneous synovial enhancement following contrast, consistent with inflammatory arthropathy. There are scattered erosions or intraosseous cysts within the scaphoid, capitate and hamate. No significant erosive change involving the metacarpal bases. The ulnar styloid and distal radius appear intact. Bones/carpal alignment: The carpal bone alignment is normal.No evidence of acute fracture, dislocation or osteonecrosis. No evidence of osteomyelitis. Other: Generalized subcutaneous edema and enhancement surrounding the wrist without focal fluid collection. IMPRESSION: 1. Moderate to large radiocarpal and midcarpal joint effusions with heterogeneous synovial enhancement following contrast, consistent with inflammatory arthropathy. Cannot exclude septic arthritis; correlate clinically. Scattered erosions or intraosseous cysts within the scaphoid, capitate and hamate. 2. Mild extensor and flexor tenosynovitis, likely inflammatory. 3. Diffuse soft tissue edema and enhancement attributed to inflammation. Cellulitis not excluded. 4. No evidence of osteomyelitis or soft tissue abscess. Electronically Signed   By: Carey Bullocks M.D.   On: 01/27/2023 15:57   DG Wrist Complete Right  Result Date: 01/27/2023 CLINICAL DATA:  Pain and swelling no history of trauma. EXAM: RIGHT WRIST - COMPLETE 3 VIEW COMPARISON:  None Available. FINDINGS: Soft tissue swelling about the wrist. Chondrocalcinosis. Slight negative ulnar variance. No acute fracture or dislocation. Preserved bone mineralization and joint spaces. If there is further concern including for infection, MRI may be useful for further delineation. IMPRESSION: Soft tissue swelling. Slight negative ulnar variance with some chondrocalcinosis in the area of the triangular fibrocartilage. Electronically Signed   By: Karen Kays M.D.   On: 01/27/2023  11:28    Procedures Procedures    Medications Ordered in ED Medications  fentaNYL (SUBLIMAZE) injection 50 mcg (50 mcg Intravenous Given 01/27/23 1153)  gadobutrol (GADAVIST) 1 MMOL/ML injection 5 mL (5 mLs Intravenous Contrast Given 01/27/23 1518)    ED Course/ Medical Decision Making/ A&P                             Medical Decision Making Pt here with increased pain and new onset swelling of her hand and wrist.  Sx's began with right thumb pain few days ago she put heat on her hand and woke up with swelling of hand and wrist.  Pain with any attempted movement.  No clinical signs of infection, no reported injury.  NV intact  Differential includes but not limited to gamekeepers thumb, fx, dislocation septic joint, inflammatory process from repetitive use, tenosynovitis  Amount and/or Complexity of Data Reviewed Labs: ordered.    Details: Labs, mild leukocytosis but white elevated 4 months ago as well. No significant change, remaining labs unremarkable Radiology: ordered.    Details: MR hand and wrist findings suggest inflammatory arthropathy.  No  focal fluid consolidations.  Diffuse soft tissue edema.  Cellulitis and septic arthritis not excluded.   Discussion of management or test interpretation with external provider(s): I suspect pt's sx's are related to an inflammatory process.  There is no erythema, excessive warmth, wounds, or skin lesions to suggest an infectious process.  No systemic sx's.  There was no hand or wrist involvement until patient applied heat to her hand.    Patient also seen by Dr. Estell Harpin and care plan was discussed.    On recheck, pt is feeling better after toradol.  She is hypertensive here.  Admits that she has been out of her BP meds for some time.  No evidence of end organ damage.  Will give dose of BP meds here and prescription written for her medications.  I will switch her from Micardis to Avapro as she cannot afford the Micardis.    Bulky dressing applied  to the hand.  Avoid heat and she is agreeable to close orthopedic f/u.  Strict return precautions given   Risk Prescription drug management.           Final Clinical Impression(s) / ED Diagnoses Final diagnoses:  Right wrist pain  Pain of right thumb  Hypertension, unspecified type    Rx / DC Orders ED Discharge Orders     None         Rosey Bath 01/29/23 Erick Alley, MD 02/02/23 1035

## 2023-01-27 NOTE — ED Triage Notes (Signed)
Pt arrived into ED with c/o right hand and wrsit pain. Pt right thumb became swollen on Monday. This morning pt woke up and her right wrist/hand is swollen, and painful.

## 2023-01-27 NOTE — Discharge Instructions (Signed)
Elevate your hand when possible.  Apply ice packs to help reduce swelling.  Call the orthopedic provider listed to arrange follow-up appointment.  Your blood pressure today was high.  I have written a refill for your blood pressure medications.  Please take these daily as directed.  Follow-up with your primary care provider for recheck regarding her blood pressure.

## 2023-02-03 ENCOUNTER — Ambulatory Visit: Payer: Self-pay | Admitting: Orthopaedic Surgery

## 2023-08-05 NOTE — Patient Instructions (Addendum)
        Great to see you today.  I have refilled the medication(s) we provide.    Lung Cancer Screening: Call our Nurse Navigator at [phone].    If labs were collected, we will inform you of lab results once received either by echart message or telephone call.   - echart message- for normal results that have been seen by the patient already.   - telephone call: abnormal results or if patient has not viewed results in their echart.   - Please take medications as prescribed. - Follow up with your primary health provider if any health concerns arises. - If symptoms worsen please contact your primary care provider and/or visit the emergency department.

## 2023-08-05 NOTE — Progress Notes (Signed)
Established Patient Office Visit   Subjective  Patient ID: Jasmine Davies, female    DOB: 1965/08/14  Age: 57 y.o. MRN: [id number]  Chief Complaint  Patient presents with   Care Management    Check up, has adult disability paperwork . States her hip, copd and neck pain has worsened.     She  has a past medical history of Chronic back pain, Chronic pain in left foot, Cleft foot (L foot), Degenerative joint disease, Depression (2003), Hernia, hiatal, Hiatal hernia, Hypertension, PUD (peptic ulcer disease), and Sciatica.  Patient reports a recurrent issue with neck pain, with the current episode beginning two weeks ago. The pain is constant and has been gradually worsening. It is localized to the occipital region and described as aching and cramping, with a severity of 10/10. Symptoms are aggravated by certain positions and bending, and the pain remains consistent throughout the day. Morning stiffness is noted, along with associated symptoms of headaches and numbness. Pertinent negatives include no chest pain, fever, difficulty swallowing, pain with swallowing, paresis, or visual changes. The patient has tried acetaminophen for relief, but it provided mild improvement.    Review of Systems  Constitutional:  Negative for chills and fever.  Respiratory:  Positive for cough, shortness of breath and wheezing.   Cardiovascular:  Negative for chest pain.  Gastrointestinal:  Negative for abdominal pain.  Genitourinary:  Negative for dysuria.  Musculoskeletal:  Positive for myalgias and neck pain.  Neurological:  Positive for headaches.      Objective:     BP 138/86 (BP Location: Left Arm)   Pulse 79   Ht 4\' 11"  (1.499 m)   Wt 118 lb 1.3 oz (53.6 kg)   SpO2 98%   BMI 23.85 kg/m  BP Readings from Last 3 Encounters:  08/06/23 138/86  01/27/23 (!) 175/89  12/11/22 125/76      Physical Exam Vitals reviewed.  Constitutional:      General: She is not in acute distress.    Appearance:  Normal appearance. She is not ill-appearing, toxic-appearing or diaphoretic.  HENT:     Head: Normocephalic.  Eyes:     General:        Right eye: No discharge.        Left eye: No discharge.     Conjunctiva/sclera: Conjunctivae normal.  Cardiovascular:     Pulses: Normal pulses.     Heart sounds: Normal heart sounds.  Pulmonary:     Effort: Pulmonary effort is normal. No respiratory distress.     Breath sounds: Normal breath sounds.  Abdominal:     General: Bowel sounds are normal.     Palpations: Abdomen is soft.     Tenderness: There is no abdominal tenderness. There is no right CVA tenderness, left CVA tenderness or guarding.  Musculoskeletal:     Cervical back: Pain with movement present. Decreased range of motion.  Skin:    General: Skin is warm and dry.     Capillary Refill: Capillary refill takes less than 2 seconds.  Neurological:     General: No focal deficit present.     Mental Status: She is alert and oriented to person, place, and time.     Coordination: Coordination normal.     Gait: Gait normal.  Psychiatric:        Mood and Affect: Mood normal.        Behavior: Behavior normal.        Thought Content: Thought content normal.  Judgment: Judgment normal.      No results found for any visits on 08/06/23.  The 10-year ASCVD risk score (Arnett DK, et al., 2019) is: 5.1%    Assessment & Plan:  Primary hypertension Assessment & Plan: Vitals:   08/06/23 0920 08/06/23 0933  BP: (!) 176/79 138/86  Patient reported has been out of her medications Refilled Amlodipine 5 mg once daily, Olmesartan 10 mg once daily Labs ordered. Discussed with  patient to monitor their blood pressure regularly and maintain a heart-healthy diet rich in fruits, vegetables, whole grains, and low-fat dairy, while reducing sodium intake to less than 2,300 mg per day. Regular physical activity, such as 30 minutes of moderate exercise most days of the week, will help lower blood  pressure and improve overall cardiovascular health. Avoiding smoking, limiting alcohol consumption, and managing stress. Take  prescribed medication, & take it as directed and avoid skipping doses. Seek emergency care if your blood pressure is (over 180/100) or you experience chest pain, shortness of breath, or sudden vision changes.Patient verbalizes understanding regarding plan of care and all questions answered.   Orders: -     BMP8+eGFR -     CBC with Differential/Platelet -     Lipid panel -     amLODIPine Besylate; Take 1 tablet (5 mg total) by mouth daily.  Dispense: 90 tablet; Refill: 1  Screening for lung cancer -     Ambulatory Referral for Lung Cancer Scre  Screening for colon cancer -     Ambulatory referral to Gastroenterology  Wheezing -     Albuterol Sulfate HFA; Inhale 2 puffs into the lungs every 6 (six) hours as needed for wheezing or shortness of breath.  Dispense: 8 g; Refill: 3  Acute neck pain -     DG Neck Soft Tissue; Future  Routine eye exam -     Ambulatory referral to Ophthalmology  COPD ? gold/ CB Assessment & Plan: Dulera inhaler 2 times daily Albuterol PRN   Neck pain Assessment & Plan: Xray Ordered awaiting results will follow up Zanaflex 4 mg PRN I explained to the patient that non-pharmacological interventions include the application of ice or heat, rest, and recommended range of motion exercises along with gentle stretching. For pain management,  can take Tylenol as well. The patient was instructed to follow up if symptoms worsen or persist. The patient verbalized understanding of the care plan, and all questions were answered.     Anxiety Assessment & Plan:    08/06/2023    9:28 AM 12/11/2022    9:44 AM 10/30/2022    9:17 AM 10/02/2022   10:01 AM  GAD 7 : Generalized Anxiety Score  Nervous, Anxious, on Edge 2 0 1 2  Control/stop worrying 2 0 0 0  Worry too much - different things 2 1 1 1   Trouble relaxing 1 0 0 1  Restless 1 0 0 0   Easily annoyed or irritable 0 1 1 2   Afraid - awful might happen 0 0 1 2  Total GAD 7 Score 8 2 4 8   Anxiety Difficulty Not difficult at all Not difficult at all Somewhat difficult Not difficult at all  Lexapro 10 mg once daily Gabapentin 300 mg twice daily PRN We discussed several non-pharmacological approaches to managing anxiety, including:  Establishing a consistent daily routine: This helps create structure and stability. Practicing mindfulness and relaxation techniques: Incorporating meditation, deep breathing exercises, or yoga to manage stress and improve emotional well-being. Engaging in  regular physical activity: Aim for at least 30 minutes of exercise most days to boost mood and energy levels. Spending time outdoors: Exposure to natural light and fresh air can improve mental health. Building a support network: Encouraging social connections with friends, family, or support groups to reduce feelings of isolation. Prioritizing a balanced diet: Eating nutrient-rich foods while avoiding excessive amounts of processed foods, sugar, and unhealthy fats. Follow-up is recommended in 4-8 weeks to assess progress, with a referral to behavioral health for further support if needed.    Other orders -     Albuterol Sulfate; Use up to every 4 hours if neededUse up to every 4 hours if needed -     Dulera; Inhale 2 puffs into the lungs 2 (two) times daily.  Dispense: 1 each; Refill: 3 -     Gabapentin; Take 1 capsule (300 mg total) by mouth 2 (two) times daily.  Dispense: 60 capsule; Refill: 3 -     Escitalopram Oxalate; Take 1 tablet (10 mg total) by mouth daily.  Dispense: 30 tablet; Refill: 3 -     tiZANidine HCl; Take 1 tablet (4 mg total) by mouth every 8 (eight) hours as needed for muscle spasms.  Dispense: 30 tablet; Refill: 2 -     Olmesartan Medoxomil; Take 0.5 tablets (10 mg total) by mouth daily. Take half a tablet once daily  Dispense: 30 tablet; Refill: 3    Return in about 4  months (around 12/05/2023), or if symptoms worsen or fail to improve, for chronic follow-up.   Cruzita Lederer Newman Nip, FNP

## 2023-08-06 ENCOUNTER — Ambulatory Visit (HOSPITAL_COMMUNITY)
Admission: RE | Admit: 2023-08-06 | Discharge: 2023-08-06 | Disposition: A | Discharge status: Home / Self Care | Payer: Medicaid Other | Source: Ambulatory Visit | Attending: Family Medicine | Admitting: Family Medicine

## 2023-08-06 ENCOUNTER — Encounter: Payer: Self-pay | Admitting: Family Medicine

## 2023-08-06 ENCOUNTER — Ambulatory Visit (INDEPENDENT_AMBULATORY_CARE_PROVIDER_SITE_OTHER): Payer: Medicaid Other | Admitting: Family Medicine

## 2023-08-06 ENCOUNTER — Other Ambulatory Visit: Payer: Self-pay | Admitting: Family Medicine

## 2023-08-06 VITALS — BP 138/86 | HR 79 | Ht 59.0 in | Wt 118.1 lb

## 2023-08-06 DIAGNOSIS — Z23 Encounter for immunization: Secondary | ICD-10-CM | POA: Diagnosis not present

## 2023-08-06 DIAGNOSIS — R062 Wheezing: Secondary | ICD-10-CM

## 2023-08-06 DIAGNOSIS — J449 Chronic obstructive pulmonary disease, unspecified: Secondary | ICD-10-CM | POA: Diagnosis not present

## 2023-08-06 DIAGNOSIS — M542 Cervicalgia: Secondary | ICD-10-CM | POA: Diagnosis not present

## 2023-08-06 DIAGNOSIS — Z122 Encounter for screening for malignant neoplasm of respiratory organs: Secondary | ICD-10-CM

## 2023-08-06 DIAGNOSIS — F419 Anxiety disorder, unspecified: Secondary | ICD-10-CM | POA: Diagnosis not present

## 2023-08-06 DIAGNOSIS — M50323 Other cervical disc degeneration at C6-C7 level: Secondary | ICD-10-CM | POA: Diagnosis not present

## 2023-08-06 DIAGNOSIS — I10 Essential (primary) hypertension: Secondary | ICD-10-CM | POA: Diagnosis not present

## 2023-08-06 DIAGNOSIS — Z0100 Encounter for examination of eyes and vision without abnormal findings: Secondary | ICD-10-CM

## 2023-08-06 DIAGNOSIS — Z1211 Encounter for screening for malignant neoplasm of colon: Secondary | ICD-10-CM

## 2023-08-06 MED ORDER — GABAPENTIN 300 MG PO CAPS: 300.0000 mg | ORAL_CAPSULE | Freq: Two times a day (BID) | ORAL | 3 refills | Status: DC

## 2023-08-06 MED ORDER — TIZANIDINE HCL 4 MG PO TABS: 4.0000 mg | ORAL_TABLET | Freq: Three times a day (TID) | ORAL | 2 refills | Status: DC | PRN

## 2023-08-06 MED ORDER — ALBUTEROL SULFATE HFA 108 (90 BASE) MCG/ACT IN AERS
2.0000 | INHALATION_SPRAY | Freq: Four times a day (QID) | RESPIRATORY_TRACT | 3 refills | Status: DC | PRN
Start: 2023-08-06 — End: 2023-08-26

## 2023-08-06 MED ORDER — DULERA 100-5 MCG/ACT IN AERO: 2.0000 | INHALATION_SPRAY | Freq: Two times a day (BID) | RESPIRATORY_TRACT | 3 refills | Status: DC

## 2023-08-06 MED ORDER — ALBUTEROL SULFATE (2.5 MG/3ML) 0.083% IN NEBU: INHALATION_SOLUTION | RESPIRATORY_TRACT | Status: DC

## 2023-08-06 MED ORDER — OLMESARTAN MEDOXOMIL 20 MG PO TABS: 10.0000 mg | ORAL_TABLET | Freq: Every day | ORAL | 3 refills | Status: DC

## 2023-08-06 MED ORDER — ESCITALOPRAM OXALATE 10 MG PO TABS
10.0000 mg | ORAL_TABLET | Freq: Every day | ORAL | 3 refills | Status: DC
Start: 2023-08-06 — End: 2023-08-26

## 2023-08-06 MED ORDER — AMLODIPINE BESYLATE 5 MG PO TABS
5.0000 mg | ORAL_TABLET | Freq: Every day | ORAL | 1 refills | Status: DC
Start: 2023-08-06 — End: 2023-08-26

## 2023-08-06 NOTE — Assessment & Plan Note (Signed)
Dulera inhaler 2 times daily Albuterol PRN

## 2023-08-06 NOTE — Assessment & Plan Note (Addendum)
Vitals:   08/06/23 0920 08/06/23 0933  BP: (!) 176/79 138/86  Patient reported has been out of her medications Refilled Amlodipine 5 mg once daily, Olmesartan 10 mg once daily Labs ordered. Discussed with  patient to monitor their blood pressure regularly and maintain a heart-healthy diet rich in fruits, vegetables, whole grains, and low-fat dairy, while reducing sodium intake to less than 2,300 mg per day. Regular physical activity, such as 30 minutes of moderate exercise most days of the week, will help lower blood pressure and improve overall cardiovascular health. Avoiding smoking, limiting alcohol consumption, and managing stress. Take  prescribed medication, & take it as directed and avoid skipping doses. Seek emergency care if your blood pressure is (over 180/100) or you experience chest pain, shortness of breath, or sudden vision changes.Patient verbalizes understanding regarding plan of care and all questions answered.

## 2023-08-06 NOTE — Assessment & Plan Note (Signed)
    08/06/2023    9:28 AM 12/11/2022    9:44 AM 10/30/2022    9:17 AM 10/02/2022   10:01 AM  GAD 7 : Generalized Anxiety Score  Nervous, Anxious, on Edge 2 0 1 2  Control/stop worrying 2 0 0 0  Worry too much - different things 2 1 1 1   Trouble relaxing 1 0 0 1  Restless 1 0 0 0  Easily annoyed or irritable 0 1 1 2   Afraid - awful might happen 0 0 1 2  Total GAD 7 Score 8 2 4 8   Anxiety Difficulty Not difficult at all Not difficult at all Somewhat difficult Not difficult at all  Lexapro 10 mg once daily Gabapentin 300 mg twice daily PRN We discussed several non-pharmacological approaches to managing anxiety, including:  Establishing a consistent daily routine: This helps create structure and stability. Practicing mindfulness and relaxation techniques: Incorporating meditation, deep breathing exercises, or yoga to manage stress and improve emotional well-being. Engaging in regular physical activity: Aim for at least 30 minutes of exercise most days to boost mood and energy levels. Spending time outdoors: Exposure to natural light and fresh air can improve mental health. Building a support network: Encouraging social connections with friends, family, or support groups to reduce feelings of isolation. Prioritizing a balanced diet: Eating nutrient-rich foods while avoiding excessive amounts of processed foods, sugar, and unhealthy fats. Follow-up is recommended in 4-8 weeks to assess progress, with a referral to behavioral health for further support if needed.

## 2023-08-06 NOTE — Assessment & Plan Note (Signed)
Xray Ordered awaiting results will follow up Zanaflex 4 mg PRN I explained to the patient that non-pharmacological interventions include the application of ice or heat, rest, and recommended range of motion exercises along with gentle stretching. For pain management,  can take Tylenol as well. The patient was instructed to follow up if symptoms worsen or persist. The patient verbalized understanding of the care plan, and all questions were answered.

## 2023-08-07 LAB — CBC WITH DIFFERENTIAL/PLATELET
Basophils Absolute: 0.1 10*3/uL (ref 0.0–0.2)
Basos: 1 %
EOS (ABSOLUTE): 0.2 10*3/uL (ref 0.0–0.4)
Eos: 2 %
Hematocrit: 41.9 % (ref 34.0–46.6)
Hemoglobin: 14.2 g/dL (ref 11.1–15.9)
Immature Grans (Abs): 0 10*3/uL (ref 0.0–0.1)
Immature Granulocytes: 0 %
Lymphocytes Absolute: 2 10*3/uL (ref 0.7–3.1)
Lymphs: 25 %
MCH: 32.9 pg (ref 26.6–33.0)
MCHC: 33.9 g/dL (ref 31.5–35.7)
MCV: 97 fL (ref 79–97)
Monocytes Absolute: 0.8 10*3/uL (ref 0.1–0.9)
Monocytes: 10 %
Neutrophils Absolute: 4.9 10*3/uL (ref 1.4–7.0)
Neutrophils: 62 %
Platelets: 297 10*3/uL (ref 150–450)
RBC: 4.31 x10E6/uL (ref 3.77–5.28)
RDW: 12 % (ref 11.7–15.4)
WBC: 7.9 10*3/uL (ref 3.4–10.8)

## 2023-08-07 LAB — BMP8+EGFR
BUN/Creatinine Ratio: 13 (ref 9–23)
BUN: 11 mg/dL (ref 6–24)
CO2: 24 mmol/L (ref 20–29)
Calcium: 8.8 mg/dL (ref 8.7–10.2)
Chloride: 102 mmol/L (ref 96–106)
Creatinine, Ser: 0.85 mg/dL (ref 0.57–1.00)
Glucose: 74 mg/dL (ref 70–99)
Potassium: 4.1 mmol/L (ref 3.5–5.2)
Sodium: 141 mmol/L (ref 134–144)
eGFR: 81 mL/min/{1.73_m2} (ref >59)

## 2023-08-07 LAB — LIPID PANEL
Chol/HDL Ratio: 3.9 {ratio} (ref 0.0–4.4)
Cholesterol, Total: 147 mg/dL (ref 100–199)
HDL: 38 mg/dL — ABNORMAL LOW (ref >39)
LDL Chol Calc (NIH): 69 mg/dL (ref 0–99)
Triglycerides: 245 mg/dL — ABNORMAL HIGH (ref 0–149)
VLDL Cholesterol Cal: 40 mg/dL (ref 5–40)

## 2023-08-08 NOTE — Progress Notes (Signed)
Please inform patient,  Triglycerides levels elevated I encourage over the counter omega-3 fish oil 1000 mg twice daily.       Follow diet low in saturated fat.  Diet Tips: Eat More: Oats, beans, and lentils: High in soluble fiber. Fatty fish: Salmon, tuna (rich in omega-3s). Nuts and seeds: Almonds, walnuts, flaxseeds. Fruits and vegetables: Apples, berries, leafy greens. Healthy fats: Olive oil, avocado. Limit: Saturated fats: Butter, cream, fatty meats. Trans fats: Fried foods, processed snacks. Sugar and refined carbs: Sweets, white bread. Focus on whole foods, healthy fats, and fiber to improve heart health! Maintain an exercise routine 3 to 5 days a week for a minimum total of 150 minutes.

## 2023-08-09 ENCOUNTER — Encounter (INDEPENDENT_AMBULATORY_CARE_PROVIDER_SITE_OTHER): Payer: Self-pay | Admitting: *Deleted

## 2023-08-09 ENCOUNTER — Emergency Department (HOSPITAL_COMMUNITY)
Admission: EM | Admit: 2023-08-09 | Discharge: 2023-08-09 | Disposition: A | Discharge status: Home / Self Care | Payer: Medicaid Other | Attending: Emergency Medicine | Admitting: Emergency Medicine

## 2023-08-09 ENCOUNTER — Telehealth: Payer: Self-pay

## 2023-08-09 ENCOUNTER — Encounter (HOSPITAL_COMMUNITY): Payer: Self-pay | Admitting: *Deleted

## 2023-08-09 ENCOUNTER — Other Ambulatory Visit: Payer: Self-pay

## 2023-08-09 ENCOUNTER — Other Ambulatory Visit: Payer: Self-pay | Admitting: Family Medicine

## 2023-08-09 DIAGNOSIS — M542 Cervicalgia: Secondary | ICD-10-CM

## 2023-08-09 DIAGNOSIS — Z79899 Other long term (current) drug therapy: Secondary | ICD-10-CM | POA: Diagnosis not present

## 2023-08-09 DIAGNOSIS — I10 Essential (primary) hypertension: Secondary | ICD-10-CM | POA: Diagnosis not present

## 2023-08-09 DIAGNOSIS — M5412 Radiculopathy, cervical region: Secondary | ICD-10-CM | POA: Insufficient documentation

## 2023-08-09 DIAGNOSIS — J449 Chronic obstructive pulmonary disease, unspecified: Secondary | ICD-10-CM | POA: Insufficient documentation

## 2023-08-09 DIAGNOSIS — Z7982 Long term (current) use of aspirin: Secondary | ICD-10-CM | POA: Insufficient documentation

## 2023-08-09 MED ORDER — METHOCARBAMOL 500 MG PO TABS
1000.0000 mg | ORAL_TABLET | Freq: Once | ORAL | Status: AC
  Administered 2023-08-09: 1000 mg via ORAL
  Filled 2023-08-09: qty 2

## 2023-08-09 MED ORDER — METHYLPREDNISOLONE 4 MG PO TBPK: ORAL_TABLET | ORAL | 0 refills | Status: DC

## 2023-08-09 MED ORDER — HYDROCODONE-ACETAMINOPHEN 5-325 MG PO TABS: 1.0000 | ORAL_TABLET | Freq: Four times a day (QID) | ORAL | 0 refills | Status: DC | PRN

## 2023-08-09 MED ORDER — OXYCODONE-ACETAMINOPHEN 5-325 MG PO TABS
1.0000 | ORAL_TABLET | Freq: Once | ORAL | Status: AC
  Administered 2023-08-09: 1 via ORAL
  Filled 2023-08-09: qty 1
  Filled 2023-08-09: qty 2

## 2023-08-09 MED ORDER — LIDOCAINE 5 % EX PTCH
1.0000 | MEDICATED_PATCH | CUTANEOUS | Status: DC
  Administered 2023-08-09: 1 via TRANSDERMAL
  Filled 2023-08-09: qty 1

## 2023-08-09 MED ORDER — KETOROLAC TROMETHAMINE 15 MG/ML IJ SOLN
15.0000 mg | Freq: Once | INTRAMUSCULAR | Status: AC
  Administered 2023-08-09: 15 mg via INTRAMUSCULAR
  Filled 2023-08-09: qty 1

## 2023-08-09 MED ORDER — LIDOCAINE 5 % EX PTCH: 1.0000 | MEDICATED_PATCH | CUTANEOUS | 0 refills | Status: DC

## 2023-08-09 NOTE — Discharge Instructions (Addendum)
As we discussed, x-ray imaging of your neck has resulted and shows some degenerative disc disease at C6-C7.  Given this, I suspect your symptoms are due to something known as radiculopathy or a pinched nerve.   For this, I have given you a prescription for a few doses of vicodin for you to take as prescribed as needed for severe pain only.  Do not drive or operate machinery while taking this medication as it can be sedating.  Have also given you a Medrol Dosepak to take as prescribed in its entirety to help with inflammation.  You can also use the lidocaine patches I have prescribed to you over the area that hurts.  You can also try topical Voltaren gel or a Salonpas pain relief patch specifically the ones with methyl salicylate on the area as these provide anti-inflammatory properties given that you cannot take NSAIDs.  It looks like your primary care provider has already reviewed this x-ray and given you a referral to orthopedics.  Please follow-up with them at your earliest convenience.  I also recommend that you rest, avoid aggressive twisting in your neck is much as possible and use a heating pad over the area that hurts.  Follow-up with your primary doctor in the next few days.  Take the tizanidine that you are already prescribed as needed.  Return if development of any new or worsening symptoms.

## 2023-08-09 NOTE — ED Triage Notes (Addendum)
Pt brought in by [facility] from home with c/o neck pain that radiates down to left shoulder that started mid of last week. Pt saw her PCP on Friday and had xray taken but she hasn't received the results yet. Pain worse with movement. BP 150/80, HR 76 for EMS. Denies injury.

## 2023-08-09 NOTE — ED Provider Notes (Signed)
[place] EMER[facility]ENC[facility] DEPARTMENT AT [facility] Provider Note   CSN: [id number] Arrival date & time: 08/09/23  1057     History  Chief Complaint  Patient presents with   Neck Pain    Jasmine Davies is a 57 y.o. female.  Patient with history of hypertension, chronic back pain, COPD presents today with complaints of neck pain. She states that same began initially 2 weeks ago when she was cleaning her house and had gradual soreness in the left side of her neck. She state states that pain is in the left occiput and is only present with movement. She states that she has pain when she moves her head. Pain shoots into her left arm. Denies any numbness or weakness in her extremities. No loss of bowel or bladder function or saddle paraesthesias. Denies vision changes, fevers, or chills. No history of similar symptoms previously. Went to her primary care doctor to discuss these symptoms and had an x-ray of her cervical spine that has not been read yet. She was given tizanidine which she has been taking with no improvement. She has been taking tylenol with minimal improvement.   The history is provided by the patient. No language interpreter was used.  Neck Pain      Home Medications Prior to Admission medications   Medication Sig Start Date End Date Taking? Authorizing Provider  albuterol (PROVENTIL) (2.5 MG/3ML) 0.083% nebulizer solution Use up to every 4 hours if neededUse up to every 4 hours if needed 08/06/23   Del Newman Nip, Tenna Child, FNP  albuterol (VENTOLIN HFA) 108 (90 Base) MCG/ACT inhaler Inhale 2 puffs into the lungs every 6 (six) hours as needed for wheezing or shortness of breath. 08/06/23   Del Nigel Berthold, FNP  amLODipine (NORVASC) 5 MG tablet Take 1 tablet (5 mg total) by mouth daily. 08/06/23   Del Nigel Berthold, FNP  aspirin EC 81 MG tablet Take 81 mg by mouth daily.    [provider]  escitalopram (LEXAPRO) 10 MG tablet Take 1 tablet (10 mg  total) by mouth daily. 08/06/23   Del Nigel Berthold, FNP  gabapentin (NEURONTIN) 300 MG capsule Take 1 capsule (300 mg total) by mouth 2 (two) times daily. 08/06/23   Del Nigel Berthold, FNP  HYDROcodone-acetaminophen (NORCO/VICODIN) 5-325 MG tablet Take one tab po q 4 hrs prn pain 01/27/23   Triplett, Tammy, PA-C  mometasone-formoterol (DULERA) 100-5 MCG/ACT AERO Inhale 2 puffs into the lungs 2 (two) times daily. 08/06/23   Del Nigel Berthold, FNP  olmesartan (BENICAR) 20 MG tablet Take 0.5 tablets (10 mg total) by mouth daily. Take half a tablet once daily 08/06/23   Del Newman Nip, Tenna Child, FNP  tiZANidine (ZANAFLEX) 4 MG tablet Take 1 tablet (4 mg total) by mouth every 8 (eight) hours as needed for muscle spasms. 08/06/23   Del Nigel Berthold, FNP      Allergies    Penicillins    Review of Systems   Review of Systems  Musculoskeletal:  Positive for neck pain.  All other systems reviewed and are negative.   Physical Exam Updated Vital Signs BP 121/87   Pulse 77   Temp 97.8 F (36.6 C) (Oral)   Resp 17   Ht 4\' 11"  (1.499 m)   Wt 53.5 kg   SpO2 94%   BMI 23.83 kg/m  Physical Exam Vitals and nursing note reviewed.  Constitutional:      General: She is not  in acute distress.    Appearance: Normal appearance. She is normal weight. She is not ill-appearing, toxic-appearing or diaphoretic.  HENT:     Head: Normocephalic and atraumatic.  Eyes:     Extraocular Movements: Extraocular movements intact.     Pupils: Pupils are equal, round, and reactive to light.  Neck:     Comments: No midline tenderness to palpation of the cervical spine. Left sided muscle tightness and tenderness noted. Positive Spurling maneuver.   No meningismus  Cardiovascular:     Rate and Rhythm: Normal rate.     Pulses:          Radial pulses are 2+ on the right side and 2+ on the left side.  Pulmonary:     Effort: Pulmonary effort is normal. No respiratory distress.   Musculoskeletal:        General: Normal range of motion.     Cervical back: Normal range of motion.     Comments: 5/5 strength and sensation intact to bilateral upper extremities. Observed to be ambulatory with steady gait.   Skin:    General: Skin is warm and dry.  Neurological:     General: No focal deficit present.     Mental Status: She is alert and oriented to person, place, and time.     Cranial Nerves: No cranial nerve deficit.  Psychiatric:        Mood and Affect: Mood normal.        Behavior: Behavior normal.     ED Results / Procedures / Treatments   Labs (all labs ordered are listed, but only abnormal results are displayed) Labs Reviewed - No data to display  EKG None  Radiology No results found.  Procedures Procedures    Medications Ordered in ED Medications  ketorolac (TORADOL) 15 MG/ML injection 15 mg (has no administration in time range)  methocarbamol (ROBAXIN) tablet 1,000 mg (has no administration in time range)  oxyCODONE-acetaminophen (PERCOCET/ROXICET) 5-325 MG per tablet 1-2 tablet (has no administration in time range)  lidocaine (LIDODERM) 5 % 1 patch (has no administration in time range)    ED Course/ Medical Decision Making/ A&P                                 Medical Decision Making Risk Prescription drug management.   Patient presents today with complaints of left sided neck pain x 2 weeks. She is afebrile, non-toxic appearing, and in no acute distress with reassuring vital signs. Physical exam reveals no midline tenderness to palpation of the cervical spine. Left sided muscle tightness and tenderness to palpation without overlying skin changes. Positive Spurling maneuver. Good strength and sensation intact to bilateral upper and lower extremities. Patient is alert and oriented and neurologically intact without focal deficits. She denies any specific neck trauma. Chart reviewed, patient seen and evaluated for these symptoms by her primary  doctor on 12/27, had a cervical spine x-ray ordered which has not yet resulted. She was sent home with tizanidine for her symptoms.   I did call [facility] and had patients imaging expedited. Her x-ray has resulted and reveals  1. No acute findings. 2. C6-7 degenerative disc disease.  I have personally reviewed and interpreted this imaging and agree with radiology interpretation.   Chart reviewed, appears that the patients NP has already reviewed this imaging and given referral to orthopedics which is reasonable. Appears that this C6-7 degenerative disc disease was  already present when she had a CT of her cervical spine back in 01/24.   Patient given lidocaine patch, toradol, percocet, and robaxin for her symptoms with significant improvement. She is now able to range her neck with only minimal discomfort. She has no neurologic deficits to suggest emergent condition requiring MRI imaging at this time. She also has several comorbid conditions that contraindicate long term systemic NSAIDs. Therefore recommend topical Voltaren gel or Salonpas with Methyl Salicylate. Will also give a few doses of vicodin for symptoms. PDMP reviewed. Patient advised not to drive or operate heavy machinery while taking this medication. Will also send for Medrol dosepak. Rest with heating pad and close outpatient follow-up with return precautions recommended and discussed as well. Evaluation and diagnostic testing in the emergency department does not suggest an emergent condition requiring admission or immediate intervention beyond what has been performed at this time.  Plan for discharge with close PCP follow-up.  Patient is understanding and amenable with plan, educated on red flag symptoms that would prompt immediate return.  Patient discharged in stable condition.  Final Clinical Impression(s) / ED Diagnoses Final diagnoses:  Cervical radiculopathy    Rx / DC Orders ED Discharge Orders          Ordered     HYDROcodone-acetaminophen (NORCO/VICODIN) 5-325 MG tablet  Every 6 hours PRN        08/09/23 1721    methylPREDNISolone (MEDROL DOSEPAK) 4 MG TBPK tablet        08/09/23 1721    lidocaine (LIDODERM) 5 %  Every 24 hours        08/09/23 1721          An After Visit Summary was printed and given to the patient.     Vear Clock 08/09/23 1730    Gloris Manchester, MD 08/10/23 [phone]

## 2023-08-09 NOTE — Telephone Encounter (Signed)
Copied from CRM [id number]. Topic: Clinical - Lab/Test Results >> Aug 09, 2023  9:30 AM Maxwell Marion wrote: Reason for CRM: Pt called for results of xray on the neck. Says she wants to know what is going on because her neck is in so much pain and she can barely move it and the pain is starting to radiate down arm

## 2023-08-09 NOTE — ED Notes (Signed)
Pt stated she noticed the pain 3-4 days ago. Neck pain is common. PCP has prescribed her muscle relaxer stated it no longer works. Pain is beginning to move down her left side and making it hard for her to move she stated. Limited ROM seen in upper left extremity.

## 2023-08-09 NOTE — Telephone Encounter (Signed)
Results pending.

## 2023-08-09 NOTE — Progress Notes (Signed)
Please inform patient xray showed C6-7 degenerative disc disease which means wear-and-tear of the intervertebral disc located between the sixth (C6) and seventh (C7) cervical vertebrae in your neck. This condition can cause neck pain, stiffness, and sometimes nerve compression, leading to symptoms such as arm pain, numbness, or weakness  Referral placed to Orthopedics

## 2023-08-18 ENCOUNTER — Telehealth: Payer: Self-pay | Admitting: Family Medicine

## 2023-08-18 NOTE — Telephone Encounter (Signed)
 Attempted to return call to go over imaging results.

## 2023-08-18 NOTE — Telephone Encounter (Signed)
 Copied from CRM [id number]. Topic: General - Call Back - No Documentation >> Aug 17, 2023  3:49 PM Clayton Bibles wrote: Reason for CRM: Returning a call from yesterday. Please have nurse call her at [phone]

## 2023-08-19 NOTE — Telephone Encounter (Signed)
 Copied from CRM [id number]. Topic: Clinical - Lab/Test Results >> Aug 18, 2023  1:08 PM Montie POUR wrote: Reason for CRM: Returning a call from today about her Imaging results. Please have nurse call her at [phone] Ina wants to see if you can mail her the disability paperwork that is at the front desk. She does not have a ride to pick them up.

## 2023-08-19 NOTE — Telephone Encounter (Signed)
 Spoke to pt went over imaging results.

## 2023-08-25 ENCOUNTER — Ambulatory Visit: Payer: Self-pay | Admitting: Family Medicine

## 2023-08-25 NOTE — Telephone Encounter (Signed)
  Chief Complaint: worsening neck pain Symptoms: neck pain, left shoulder pain, difficulty raising left arm, left hand fingers numbness Frequency: today Pertinent Negatives: Patient denies fevers, loss of bowel or bladder control, chest pain, SOB, swelling. Disposition: [x] ED /[x] Urgent Care (no appt availability in office) / [] Appointment(In office/virtual)/ []  [place] [facility]/ [] Home Care/ [] Refused Recommended Disposition /[] [place] [facility]/ []  Follow-up with PCP Additional Notes: Offered acute office visit for this afternoon or tomorrow, patient refused due to transportation. Recommended evaluation within 24 hours and patient states she is willing to go to urgent care or ED. Patient asking if she has any refills of her muscle relaxer. Informed her tizanidine  has 2 refills. Patient also requesting her prescriptions be transferred to [facility] in [place].   Copied from CRM [id number]. Topic: Clinical - Red Word Triage >> Aug 25, 2023  3:08 PM Elle L wrote: Red Word that prompted transfer to Nurse Triage: The patient is in severe pain in her neck and cannot lift her arm. Reason for Disposition  Numbness in an arm or hand (i.e., loss of sensation)  Answer Assessment - Initial Assessment Questions 1. ONSET: "When did the pain begin?"      Neck pain x 2-3 weeks and left arm pain started today.  2. LOCATION: "Where does it hurt?"      Left shoulder where the neck and arm meet at the muscle.  3. PATTERN "Does the pain come and go, or has it been constant since it started?"      Constant.  4. SEVERITY: "How bad is the pain?"  (Scale 1-10; or mild, moderate, severe)   - NO PAIN (0): no pain or only slight stiffness    - MILD (1-3): doesn't interfere with normal activities    - MODERATE (4-7): interferes with normal activities or awakens from sleep    - SEVERE (8-10):  excruciating pain, unable to do any normal activities      10/10.  5. RADIATION: "Does the pain go  anywhere else, shoot into your arms?"     Denies.  6. CORD SYMPTOMS: "Any weakness or numbness of the arms or legs?"     Left hand fingers feel numb since today.  7. CAUSE: "What do you think is causing the neck pain?"     Patient states she was seen in ED and was told C6, C7 had degenerative disc disease. She states she has an appointment next week to see an orthopedist.  8. NECK OVERUSE: "Any recent activities that involved turning or twisting the neck?"     Patient denies.  9. OTHER SYMPTOMS: "Do you have any other symptoms?" (e.g., headache, fever, chest pain, difficulty breathing, neck swelling)     Mild headache.  Protocols used: Neck Pain or Stiffness-A-AH

## 2023-08-26 ENCOUNTER — Other Ambulatory Visit: Payer: Self-pay | Admitting: Family Medicine

## 2023-08-26 DIAGNOSIS — R062 Wheezing: Secondary | ICD-10-CM

## 2023-08-26 DIAGNOSIS — I10 Essential (primary) hypertension: Secondary | ICD-10-CM

## 2023-08-26 MED ORDER — TIZANIDINE HCL 4 MG PO TABS: 4.0000 mg | ORAL_TABLET | Freq: Three times a day (TID) | ORAL | 2 refills | Status: DC | PRN

## 2023-08-26 MED ORDER — ALBUTEROL SULFATE (2.5 MG/3ML) 0.083% IN NEBU: INHALATION_SOLUTION | RESPIRATORY_TRACT | Status: AC

## 2023-08-26 MED ORDER — GABAPENTIN 300 MG PO CAPS: 300.0000 mg | ORAL_CAPSULE | Freq: Two times a day (BID) | ORAL | 3 refills | Status: DC

## 2023-08-26 MED ORDER — ALBUTEROL SULFATE HFA 108 (90 BASE) MCG/ACT IN AERS: 2.0000 | INHALATION_SPRAY | Freq: Four times a day (QID) | RESPIRATORY_TRACT | 3 refills | Status: AC | PRN

## 2023-08-26 MED ORDER — DULERA 100-5 MCG/ACT IN AERO: 2.0000 | INHALATION_SPRAY | Freq: Two times a day (BID) | RESPIRATORY_TRACT | 3 refills | Status: AC

## 2023-08-26 MED ORDER — AMLODIPINE BESYLATE 5 MG PO TABS: 5.0000 mg | ORAL_TABLET | Freq: Every day | ORAL | 1 refills | Status: DC

## 2023-08-26 MED ORDER — OLMESARTAN MEDOXOMIL 20 MG PO TABS: 10.0000 mg | ORAL_TABLET | Freq: Every day | ORAL | 3 refills | Status: DC

## 2023-08-26 MED ORDER — ESCITALOPRAM OXALATE 10 MG PO TABS: 10.0000 mg | ORAL_TABLET | Freq: Every day | ORAL | 3 refills | Status: DC

## 2023-08-26 NOTE — Telephone Encounter (Signed)
Sent her medication to [facility]

## 2023-08-26 NOTE — Telephone Encounter (Signed)
LVM making pt aware

## 2023-09-02 ENCOUNTER — Other Ambulatory Visit (INDEPENDENT_AMBULATORY_CARE_PROVIDER_SITE_OTHER): Payer: Medicaid Other

## 2023-09-02 ENCOUNTER — Ambulatory Visit: Payer: Medicaid Other | Admitting: Orthopedic Surgery

## 2023-09-02 VITALS — BP 147/81 | HR 85 | Ht 59.0 in | Wt 118.0 lb

## 2023-09-02 DIAGNOSIS — M5412 Radiculopathy, cervical region: Secondary | ICD-10-CM

## 2023-09-02 DIAGNOSIS — M542 Cervicalgia: Secondary | ICD-10-CM | POA: Diagnosis not present

## 2023-09-02 MED ORDER — TRAMADOL HCL 50 MG PO TABS: 50.0000 mg | ORAL_TABLET | Freq: Four times a day (QID) | ORAL | 0 refills | Status: DC | PRN

## 2023-09-02 NOTE — Progress Notes (Signed)
Orthopedic Spine Surgery Office Note  Assessment: Patient is a 58 y.o. female with neck pain that radiates into the left upper extremity, suspect radiculopathy   Plan: -Explained that initially conservative treatment is tried as a significant number of patients may experience relief with these treatment modalities. Discussed that the conservative treatments include:  -activity modification  -physical therapy  -over the counter pain medications  -medrol dosepak  -cervical steroid injections -Patient has tried Tylenol, gabapentin, Medrol Dosepak -Gabapentin was started by her primary care doctor on 08/26/2023 -Recommended she continue with the gabapentin.  Prescribed tramadol for additional pain relief, referred her to physical therapy -If she is not doing any better at her next visit, we will order an MRI of the cervical spine to evaluate further -Would need to be nicotine free prior to any elective spine surgery -Patient should return to office in 4 weeks, x-rays at next visit: None   Patient expressed understanding of the plan and all questions were answered to the patient's satisfaction.   ___________________________________________________________________________   History:  Patient is a 58 y.o. female who presents today for cervical spine.  Patient has had 1 month of neck pain that radiates into the left upper extremity.  She feels a going into the left lateral arm and into the dorsal forearm.  Pain does not radiate past the wrist.  She does get numbness and paresthesias in that same distribution but does extend into the hand.  She feels it in all the fingers and the hand.  She does not have any pain radiating into the right upper extremity.  There is no trauma or injury that preceded the onset of pain.   Weakness: Denies Difficulty with fine motor skills (e.g., buttoning shirts, handwriting): Denies Symptoms of imbalance: Denies Paresthesias and numbness: Yes, gets numbness and  paresthesias into the left upper extremity along the lateral arm and dorsal forearm into the hand.  No other numbness or paresthesias Bowel or bladder incontinence: Denies Saddle anesthesia: Denies  Treatments tried: Tylenol, gabapentin, Medrol Dosepak  Review of systems: Denies fevers and chills, night sweats, unexplained weight loss, history of cancer, pain that wakes them at night  Past medical history: Chronic pain Depression Peptic ulcer disease HTN  Allergies: penicillin  Past surgical history:  C section Tubal ligation Cholecystectomy Left club foot surgery  Social history: Reports use of nicotine product (smoking, vaping, patches, smokeless) Alcohol use: denies Denies recreational drug use   Physical Exam:  BMI of 23.8  General: no acute distress, appears stated age Neurologic: alert, answering questions appropriately, following commands Respiratory: unlabored breathing on room air, symmetric chest rise Psychiatric: appropriate affect, normal cadence to speech   MSK (spine):  -Strength exam      Left  Right Grip strength                5/5  5/5 Interosseus   5/5   5/5 Wrist extension  5/5  5/5 Wrist flexion   5/5  5/5 Elbow flexion   5/5  5/5 Deltoid    5/5  5/5  EHL    5/5  5/5 TA    5/5  5/5 GSC    5/5  5/5 Knee extension  5/5  5/5 Hip flexion   5/5  5/5  -Sensory exam    Sensation intact to light touch in L3-S1 nerve distributions of bilateral lower extremities  Sensation intact to light touch in C5-T1 nerve distributions of bilateral upper extremities  -Brachioradialis DTR: 2/4 on the left,  2/4 on the right -Biceps DTR: 2/4 on the left, 2/4 on the right -Triceps DTR: 2/4 on the left, 2/4 on the right -Achilles DTR: 2/4 on the left, 2/4 on the right -Patellar tendon DTR: 2/4 on the left, 2/4 on the right  -Spurling: negative bilaterally -Hoffman sign: negative bilaterally -Clonus: no beats bilaterally -Interosseous wasting: none  seen -Grip and release test: negative -Romberg: negative -Gait: normal  Left shoulder exam: no pain through range of motion, negative jobe,negative belly press, no weakness with external rotation with arm at side Right shoulder exam: no pain through range of motion  Tinel's at wrist: negative bilaterally Phalen's at wrist: negative bilaterally  Durkan's: negative bilaterally  Tinel's at elbow: negative bilaterally  Imaging: XRs of the cervical spine from 08/06/2023 were independently reviewed and interpreted, showing anterior osteophyte formation at C5/6. Disc height loss and anterior osteophyte formation at C6/7. No fracture or dislocation seen. Neutral alignment.    Patient name: Jasmine Davies Patient MRN: [id number] Date of visit: 09/02/23

## 2023-09-08 ENCOUNTER — Telehealth: Payer: Self-pay | Admitting: Radiology

## 2023-09-08 NOTE — Telephone Encounter (Signed)
Patient called to check on status of PT referral.  Per chart, [facility] left her a voicemail on 09/02/2023.  She states that she spoke with someone there and they told her they did not have a referral. I called [facility] and was told they do have the referral and they left a voicemail on 09/02/2023.  Referral will pop back up in their que at 7 day mark for them to reach out to patient again, or patient may call them to schedule. I called patient back and advised. She will reach out to them tomorrow.

## 2023-09-09 ENCOUNTER — Telehealth (INDEPENDENT_AMBULATORY_CARE_PROVIDER_SITE_OTHER): Payer: Self-pay | Admitting: Gastroenterology

## 2023-09-09 NOTE — Telephone Encounter (Signed)
Ok to schedule.  Room 1/2  Thanks,  Vista Lawman, MD Gastroenterology and Hepatology [facility]

## 2023-09-09 NOTE — Telephone Encounter (Signed)
Who is your primary care physician: Jasmine Davies  Reasons for the colonoscopy: Screening  Have you had a colonoscopy before?  no  Do you have family history of colon cancer? no  Previous colonoscopy with polyps removed? no  Do you have a history colorectal cancer?   no  Are you diabetic? If yes, Type 1 or Type 2?    no  Do you have a prosthetic or mechanical heart valve? no  Do you have a pacemaker/defibrillator?   no  Have you had endocarditis/atrial fibrillation? no  Have you had joint replacement within the last 12 months?  no  Do you tend to be constipated or have to use laxatives? no  Do you have any history of drugs or alchohol?  yes  Do you use supplemental oxygen?  no  Have you had a stroke or heart attack within the last 6 months? no  Do you take weight loss medication?  no  For female patients: have you had a hysterectomy?  no                                     are you post menopausal?                                                   do you still have your menstrual cycle? no      Do you take any blood-thinning medications such as: (aspirin, warfarin, Plavix, Aggrenox)  no  If yes we need the name, milligram, dosage and who is prescribing doctor  Current Outpatient Medications on File Prior to Visit  Medication Sig Dispense Refill   amLODipine (NORVASC) 5 MG tablet Take 1 tablet (5 mg total) by mouth daily. 90 tablet 1   escitalopram (LEXAPRO) 10 MG tablet Take 1 tablet (10 mg total) by mouth daily. 30 tablet 3   gabapentin (NEURONTIN) 300 MG capsule Take 1 capsule (300 mg total) by mouth 2 (two) times daily. 60 capsule 3   olmesartan (BENICAR) 20 MG tablet Take 0.5 tablets (10 mg total) by mouth daily. Take half a tablet once daily 30 tablet 3   tiZANidine (ZANAFLEX) 4 MG tablet Take 1 tablet (4 mg total) by mouth every 8 (eight) hours as needed for muscle spasms. 30 tablet 2   albuterol (PROVENTIL) (2.5 MG/3ML) 0.083% nebulizer solution Use up to every 4  hours if neededUse up to every 4 hours if needed (Patient not taking: Reported on 09/09/2023)     albuterol (VENTOLIN HFA) 108 (90 Base) MCG/ACT inhaler Inhale 2 puffs into the lungs every 6 (six) hours as needed for wheezing or shortness of breath. (Patient not taking: Reported on 09/09/2023) 8 g 3   aspirin EC 81 MG tablet Take 81 mg by mouth daily. (Patient not taking: Reported on 09/09/2023)     lidocaine (LIDODERM) 5 % Place 1 patch onto the skin daily. Remove & Discard patch within 12 hours or as directed by MD (Patient not taking: Reported on 09/09/2023) 30 patch 0   methylPREDNISolone (MEDROL DOSEPAK) 4 MG TBPK tablet Take as directed on package (Patient not taking: Reported on 09/09/2023) 1 each 0   mometasone-formoterol (DULERA) 100-5 MCG/ACT AERO Inhale 2 puffs into the lungs 2 (two) times daily. (Patient not taking:  Reported on 09/09/2023) 1 each 3   No current facility-administered medications on file prior to visit.    Allergies  Allergen Reactions   Penicillins Rash     Pharmacy: [facility]  Primary Insurance Name: Medicaid  Best number where you can be reached: [phone]

## 2023-09-13 NOTE — Telephone Encounter (Signed)
 Left message to return call

## 2023-09-14 ENCOUNTER — Encounter (INDEPENDENT_AMBULATORY_CARE_PROVIDER_SITE_OTHER): Payer: Self-pay | Admitting: *Deleted

## 2023-09-14 MED ORDER — PEG 3350-KCL-NA BICARB-NACL 420 G PO SOLR: 4000.0000 mL | Freq: Once | ORAL | 0 refills | Status: AC

## 2023-09-14 NOTE — Telephone Encounter (Signed)
Pt left message returning call. Returned call to pt this morning. Pt scheduled. PA will need to be faxed in. Instructions mailed to pt. Prep sent to pharmacy. I also went over instructions with pt.

## 2023-09-14 NOTE — Telephone Encounter (Signed)
 Referral completed, [facility]CS apt letter sent to PCP

## 2023-09-14 NOTE — Addendum Note (Signed)
Addended by: Marlowe Shores on: 09/14/2023 08:38 AM   Modules accepted: Orders

## 2023-09-21 ENCOUNTER — Telehealth (INDEPENDENT_AMBULATORY_CARE_PROVIDER_SITE_OTHER): Payer: Self-pay | Admitting: Gastroenterology

## 2023-09-21 NOTE — Telephone Encounter (Signed)
Pt left message stating that she can not get our of her driveway and would not be able to make to procedure.  Returned call to patient and rescheduled her to 10/19/23. Pt states she still has her prep. New instructions will be mailed to pt.

## 2023-09-27 ENCOUNTER — Ambulatory Visit (HOSPITAL_COMMUNITY): Payer: Medicaid Other

## 2023-09-27 NOTE — Therapy (Deleted)
 OUTPATIENT PHYSICAL THERAPY CERVICAL EVALUATION   Patient Name: Jasmine Davies MRN: [id number] DOB:1965-10-19, 58 y.o., female Today's Date: 09/27/2023  END OF SESSION:   Past Medical History:  Diagnosis Date   Chronic back pain    Chronic pain in left foot    Cleft foot L foot   Degenerative joint disease    Depression 2003   Hernia, hiatal    Hiatal hernia    Hypertension    PUD (peptic ulcer disease)    ? ETOH and ASA use   Sciatica    Past Surgical History:  Procedure Laterality Date   CESAREAN SECTION     x2   CHOLECYSTECTOMY  1993   CLUB FOOT RELEASE     left foot   ESOPHAGOGASTRODUODENOSCOPY  10/06/2011   ulcers,multiple in antrum and duodenitis/hiatal hernia   ESOPHAGOGASTRODUODENOSCOPY  12/15/2011   SLF: Moderate gastritis/ Hiatal hernia/PUD IMPROVED BUT NOT RESOLVED DUE TO ONGOING ASA USE   TUBAL LIGATION  1999   Patient Active Problem List   Diagnosis Date Noted   Neck pain 08/06/2023   Anxiety 08/06/2023   Encounter for Papanicolaou smear of cervix 12/11/2022   COPD ? 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   Hypertensive crisis 07/03/2019   TIA (transient ischemic attack) 07/02/2019   Pain in left foot 12/23/2015   Cigarette smoker 12/23/2015   Primary hypertension 12/23/2015   History of alcohol abuse 12/23/2015   Abdominal pain, other specified site 09/20/2012   Nausea with vomiting 09/20/2012   Abdominal bloating 12/09/2011   PUD (peptic ulcer disease) 12/09/2011   Epigastric pain 10/01/2011   Hematemesis 10/01/2011   Abnormal computerized axial tomography of chest 10/01/2011   Abnormal CT scan 10/01/2011    PCP: Rica Records, FNP  REFERRING PROVIDER: London Sheer, MD  REFERRING DIAG: M54.12 (ICD-10-CM) - Radiculopathy, cervical region   THERAPY DIAG:  No diagnosis found.  Rationale for Evaluation and Treatment: Rehabilitation  ONSET DATE:  ***  SUBJECTIVE:                                                                                                                                                                                                         SUBJECTIVE STATEMENT: *** Hand dominance: {MISC; OT HAND DOMINANCE:[phone]}  PERTINENT HISTORY:  ***  PAIN:  Are you having pain? {OPRCPAIN:27236}  PRECAUTIONS: {Therapy precautions:24002}  RED FLAGS: {PT Red Flags:29287}     WEIGHT BEARING RESTRICTIONS: {Yes ***/No:24003}  FALLS:  Has patient  fallen in last 6 months? {fallsyesno:27318}  LIVING ENVIRONMENT: Lives with: {OPRC lives with:25569::"lives with their family"} Lives in: {Lives in:25570} Stairs: {opstairs:27293} Has following equipment at home: {Assistive devices:23999}  OCCUPATION: ***  PLOF: {PLOF:24004}  PATIENT GOALS: ***  NEXT MD VISIT: ***  OBJECTIVE:  Note: Objective measures were completed at Evaluation unless otherwise noted.  DIAGNOSTIC FINDINGS:  ***  PATIENT SURVEYS:  NDI ***  COGNITION: Overall cognitive status: {cognition:24006}  S  POSTURE: {posture:25561}  PALPATION: ***   CERVICAL ROM:   Active ROM A/PROM (deg) eval  Flexion   Extension   Right lateral flexion   Left lateral flexion   Right rotation   Left rotation    (Blank rows = not tested)  UPPER EXTREMITY ROM:  Active ROM Right eval Left eval  Shoulder flexion    Shoulder extension    Shoulder abduction    Shoulder adduction    Shoulder extension    Shoulder internal rotation    Shoulder external rotation    Elbow flexion    Elbow extension    Wrist flexion    Wrist extension    Wrist ulnar deviation    Wrist radial deviation    Wrist pronation    Wrist supination     (Blank rows = not tested)  UPPER EXTREMITY MMT:  MMT Right eval Left eval  Shoulder flexion    Shoulder extension    Shoulder abduction    Shoulder adduction    Shoulder extension    Shoulder internal  rotation    Shoulder external rotation    Middle trapezius    Lower trapezius    Elbow flexion    Elbow extension    Wrist flexion    Wrist extension    Wrist ulnar deviation    Wrist radial deviation    Wrist pronation    Wrist supination    Grip strength     (Blank rows = not tested)  CERVICAL SPECIAL TESTS:  Neck flexor muscle endurance test: {pos/neg:25243}, Upper limb tension test (ULTT): {pos/neg:25243}, Spurling's test: {pos/neg:25243}, and Distraction test: {pos/neg:25243}  FUNCTIONAL TESTS:  {Functional tests:24029}  TREATMENT DATE: ***                                                                                                                                 PATIENT EDUCATION:  Education details: PT Evaluation, findings, prognosis, frequency, attendance policy, and HEP if given.  Person educated: Patient Education method: Medical illustrator Education comprehension: verbalized understanding  HOME EXERCISE PROGRAM: ***  ASSESSMENT:  CLINICAL IMPRESSION: Patient is a *** y.o. *** who was seen today for physical therapy evaluation and treatment for M54.12 (ICD-10-CM) - Radiculopathy, cervical region. ***. Pt demonstrating *** due to ***. Pt will benefit from skilled Physical Therapy services to address deficits/limitations in order to improve functional and QOL.    OBJECTIVE IMPAIRMENTS: {opptimpairments:25111}.   ACTIVITY LIMITATIONS: {activitylimitations:27494}  PARTICIPATION LIMITATIONS: {participationrestrictions:25113}  PERSONAL  FACTORS: {Personal factors:25162} are also affecting patient's functional outcome.   REHAB POTENTIAL: {rehabpotential:25112}  CLINICAL DECISION MAKING: {clinical decision making:25114}  EVALUATION COMPLEXITY: {Evaluation complexity:25115}   GOALS: Goals reviewed with patient? {yes/no:20286}  SHORT TERM GOALS: Target date: ***  *** and parents/caregivers will be independent with HEP in order to demonstrate  participation in Physical Therapy POC.  Baseline: Goal status: {GOALSTATUS:25110}  2.  Pt will report > 25% improvement in subjective functional in order to demonstrate improved pain with functional activities.  Baseline:  Goal status: {GOALSTATUS:25110}  LONG TERM GOALS: Target date: ***  Pt will improve Deep Neck Flexor Endurance test by *** in order to demonstrate improved functional strength to return to desired activities.  Baseline: *** Goal status: {GOALSTATUS:25110}  2.  Pt will improve Cervical ROM by *** in order to improve functional mobility during ADLs.  Baseline: *** Goal status: {GOALSTATUS:25110}  3.  Pt will improve NDI/FOTO score by *** in order to demonstrate improved pain with functional goals and outcomes. Baseline: *** Goal status: {GOALSTATUS:25110}  4.  Pt will report subjective improvement by > 50% in order to demonstrate reduced pain with overall functional status.  Baseline: *** Goal status: {GOALSTATUS:25110}  5.  Pt will *** Baseline: *** Goal status: {GOALSTATUS:25110}   PLAN:  PT FREQUENCY: {rehab frequency:25116}  PT DURATION: {rehab duration:25117}  PLANNED INTERVENTIONS: {rehab planned interventions:25118::"97110-Therapeutic exercises","97530- Therapeutic [phone]- Neuromuscular re-education","97535- Self IONG","29528- Manual therapy"}  PLAN FOR NEXT SESSION: ***   Nelida Meuse, PT 09/27/2023, 8:06 AM

## 2023-09-29 ENCOUNTER — Other Ambulatory Visit: Payer: Self-pay | Admitting: Emergency Medicine

## 2023-09-29 ENCOUNTER — Telehealth: Payer: Self-pay | Admitting: Acute Care

## 2023-09-29 DIAGNOSIS — F17210 Nicotine dependence, cigarettes, uncomplicated: Secondary | ICD-10-CM

## 2023-09-29 DIAGNOSIS — Z122 Encounter for screening for malignant neoplasm of respiratory organs: Secondary | ICD-10-CM

## 2023-09-29 DIAGNOSIS — Z87891 Personal history of nicotine dependence: Secondary | ICD-10-CM

## 2023-09-29 NOTE — Telephone Encounter (Signed)
 Lung Cancer Screening Narrative/Criteria Questionnaire (Cigarette Smokers Only- No Cigars/Pipes/vapes)   Jasmine Davies   SDMV:10/07/2023 at 10:45 with Dirk Dress NP  15-Feb-1966   LDCT: 10/11/2023 at 12:30 at [facility]    58 y.o.   Phone: [phone]  Lung Screening Narrative (confirm age 65-77 yrs Medicare / 50-80 yrs Private pay insurance)   Insurance information:[employer] Medicaid    Referring Provider:Iliana Del Newman Nip - PCP   This screening involves an initial phone call with a team member from our program. It is called a shared decision making visit. The initial meeting is required by  insurance and Medicare to make sure you understand the program. This appointment takes about 15-20 minutes to complete. You will complete the screening scan at your scheduled date/time.  This scan takes about 5-10 minutes to complete. You can eat and drink normally before and after the scan.  Criteria questions for Lung Cancer Screening:   Are you a current or former smoker? Current Age began smoking: 58yo   If you are a former smoker, what year did you quit smoking? N/A(within 15 yrs)   To calculate your smoking history, I need an accurate estimate of how many packs of cigarettes you smoked per day and for how many years. (Not just the number of PPD you are now smoking)   Years smoking 42 x Packs per day 3/4 = Pack years 31.5   (at least 20 pack yrs)   (Make sure they understand that we need to know how much they have smoked in the past, not just the number of PPD they are smoking now)  Do you have a personal history of cancer?  No    Do you have a family history of cancer? Yes  (cancer type and and relative) Father - pancreatic   Are you coughing up blood?  No  Have you had unexplained weight loss of 15 lbs or more in the last 6 months? No  It looks like you meet all criteria.  When would be a good time for [place] to schedule you for this screening?   Additional information: N/A

## 2023-09-30 ENCOUNTER — Other Ambulatory Visit: Payer: Self-pay | Admitting: Orthopedic Surgery

## 2023-09-30 ENCOUNTER — Ambulatory Visit: Payer: Medicaid Other | Admitting: Orthopedic Surgery

## 2023-10-07 ENCOUNTER — Encounter: Payer: Medicaid Other | Admitting: Adult Health

## 2023-10-11 ENCOUNTER — Ambulatory Visit (HOSPITAL_COMMUNITY): Payer: Medicaid Other

## 2023-10-12 ENCOUNTER — Emergency Department (HOSPITAL_COMMUNITY)

## 2023-10-12 ENCOUNTER — Ambulatory Visit (HOSPITAL_COMMUNITY): Payer: Self-pay

## 2023-10-12 ENCOUNTER — Other Ambulatory Visit: Payer: Self-pay

## 2023-10-12 ENCOUNTER — Encounter (HOSPITAL_COMMUNITY): Payer: Self-pay | Admitting: Emergency Medicine

## 2023-10-12 ENCOUNTER — Ambulatory Visit: Payer: Self-pay | Admitting: Family Medicine

## 2023-10-12 ENCOUNTER — Telehealth (HOSPITAL_COMMUNITY): Payer: Self-pay

## 2023-10-12 ENCOUNTER — Encounter (HOSPITAL_COMMUNITY): Payer: Self-pay

## 2023-10-12 ENCOUNTER — Emergency Department (HOSPITAL_COMMUNITY)
Admission: EM | Admit: 2023-10-12 | Discharge: 2023-10-12 | Disposition: A | Discharge status: Home / Self Care | Attending: Emergency Medicine | Admitting: Emergency Medicine

## 2023-10-12 DIAGNOSIS — R519 Headache, unspecified: Secondary | ICD-10-CM | POA: Insufficient documentation

## 2023-10-12 DIAGNOSIS — J449 Chronic obstructive pulmonary disease, unspecified: Secondary | ICD-10-CM | POA: Insufficient documentation

## 2023-10-12 DIAGNOSIS — Z7982 Long term (current) use of aspirin: Secondary | ICD-10-CM | POA: Diagnosis not present

## 2023-10-12 DIAGNOSIS — I6529 Occlusion and stenosis of unspecified carotid artery: Secondary | ICD-10-CM | POA: Diagnosis not present

## 2023-10-12 DIAGNOSIS — Z8673 Personal history of transient ischemic attack (TIA), and cerebral infarction without residual deficits: Secondary | ICD-10-CM | POA: Diagnosis not present

## 2023-10-12 DIAGNOSIS — Z79899 Other long term (current) drug therapy: Secondary | ICD-10-CM | POA: Insufficient documentation

## 2023-10-12 DIAGNOSIS — I700 Atherosclerosis of aorta: Secondary | ICD-10-CM | POA: Diagnosis not present

## 2023-10-12 DIAGNOSIS — R200 Anesthesia of skin: Secondary | ICD-10-CM | POA: Diagnosis not present

## 2023-10-12 DIAGNOSIS — I10 Essential (primary) hypertension: Secondary | ICD-10-CM | POA: Insufficient documentation

## 2023-10-12 DIAGNOSIS — R5383 Other fatigue: Secondary | ICD-10-CM | POA: Diagnosis not present

## 2023-10-12 DIAGNOSIS — G4489 Other headache syndrome: Secondary | ICD-10-CM | POA: Diagnosis not present

## 2023-10-12 DIAGNOSIS — J329 Chronic sinusitis, unspecified: Secondary | ICD-10-CM | POA: Diagnosis not present

## 2023-10-12 HISTORY — DX: Cerebral infarction, unspecified: I63.9

## 2023-10-12 LAB — COMPREHENSIVE METABOLIC PANEL
ALT: 12 U/L (ref 0–44)
AST: 27 U/L (ref 15–41)
Albumin: 3.8 g/dL (ref 3.5–5.0)
Alkaline Phosphatase: 72 U/L (ref 38–126)
Anion gap: 11 (ref 5–15)
BUN: 11 mg/dL (ref 6–20)
CO2: 26 mmol/L (ref 22–32)
Calcium: 8.4 mg/dL — ABNORMAL LOW (ref 8.9–10.3)
Chloride: 104 mmol/L (ref 98–111)
Creatinine, Ser: 0.68 mg/dL (ref 0.44–1.00)
GFR, Estimated: >60 mL/min (ref >60)
Glucose, Bld: 91 mg/dL (ref 70–99)
Potassium: 3.9 mmol/L (ref 3.5–5.1)
Sodium: 141 mmol/L (ref 135–145)
Total Bilirubin: 0.5 mg/dL (ref 0.0–1.2)
Total Protein: 6.8 g/dL (ref 6.5–8.1)

## 2023-10-12 LAB — DIFFERENTIAL
Abs Immature Granulocytes: 0.03 10*3/uL (ref 0.00–0.07)
Basophils Absolute: 0 10*3/uL (ref 0.0–0.1)
Basophils Relative: 0 %
Eosinophils Absolute: 0.2 10*3/uL (ref 0.0–0.5)
Eosinophils Relative: 3 %
Immature Granulocytes: 0 %
Lymphocytes Relative: 32 %
Lymphs Abs: 2.2 10*3/uL (ref 0.7–4.0)
Monocytes Absolute: 0.5 10*3/uL (ref 0.1–1.0)
Monocytes Relative: 8 %
Neutro Abs: 3.8 10*3/uL (ref 1.7–7.7)
Neutrophils Relative %: 57 %

## 2023-10-12 LAB — URINALYSIS, ROUTINE W REFLEX MICROSCOPIC
Bilirubin Urine: NEGATIVE
Glucose, UA: NEGATIVE mg/dL
Hgb urine dipstick: NEGATIVE
Ketones, ur: 20 mg/dL — AB
Leukocytes,Ua: NEGATIVE
Nitrite: NEGATIVE
Protein, ur: NEGATIVE mg/dL
Specific Gravity, Urine: 1.01 (ref 1.005–1.030)
pH: 7 (ref 5.0–8.0)

## 2023-10-12 LAB — CBC
HCT: 38.4 % (ref 36.0–46.0)
Hemoglobin: 13 g/dL (ref 12.0–15.0)
MCH: 32.6 pg (ref 26.0–34.0)
MCHC: 33.9 g/dL (ref 30.0–36.0)
MCV: 96.2 fL (ref 80.0–100.0)
Platelets: 278 10*3/uL (ref 150–400)
RBC: 3.99 MIL/uL (ref 3.87–5.11)
RDW: 12.9 % (ref 11.5–15.5)
WBC: 6.7 10*3/uL (ref 4.0–10.5)
nRBC: 0 % (ref 0.0–0.2)

## 2023-10-12 LAB — RAPID URINE DRUG SCREEN, HOSP PERFORMED
Amphetamines: NOT DETECTED
Barbiturates: NOT DETECTED
Benzodiazepines: NOT DETECTED
Cocaine: NOT DETECTED
Opiates: NOT DETECTED
Tetrahydrocannabinol: POSITIVE — AB

## 2023-10-12 LAB — PROTIME-INR
INR: 1 (ref 0.8–1.2)
Prothrombin Time: 13.3 s (ref 11.4–15.2)

## 2023-10-12 LAB — ETHANOL: Alcohol, Ethyl (B): <10 mg/dL (ref <10)

## 2023-10-12 LAB — MAGNESIUM: Magnesium: 2 mg/dL (ref 1.7–2.4)

## 2023-10-12 MED ORDER — KETOROLAC TROMETHAMINE 15 MG/ML IJ SOLN
15.0000 mg | Freq: Once | INTRAMUSCULAR | Status: AC
  Administered 2023-10-12: 15 mg via INTRAVENOUS
  Filled 2023-10-12: qty 1

## 2023-10-12 MED ORDER — LACTATED RINGERS IV BOLUS
500.0000 mL | Freq: Once | INTRAVENOUS | Status: AC
  Administered 2023-10-12: 500 mL via INTRAVENOUS

## 2023-10-12 MED ORDER — IOHEXOL 350 MG/ML SOLN
75.0000 mL | Freq: Once | INTRAVENOUS | Status: AC | PRN
  Administered 2023-10-12: 75 mL via INTRAVENOUS

## 2023-10-12 MED ORDER — DIPHENHYDRAMINE HCL 50 MG/ML IJ SOLN
12.5000 mg | Freq: Once | INTRAMUSCULAR | Status: AC
Start: 2023-10-12 — End: 2023-10-12
  Administered 2023-10-12: 12.5 mg via INTRAVENOUS
  Filled 2023-10-12: qty 1

## 2023-10-12 MED ORDER — METOCLOPRAMIDE HCL 5 MG/ML IJ SOLN
10.0000 mg | Freq: Once | INTRAMUSCULAR | Status: AC
  Administered 2023-10-12: 10 mg via INTRAVENOUS
  Filled 2023-10-12: qty 2

## 2023-10-12 NOTE — ED Triage Notes (Signed)
 Pt went to bed normal last night around 6pm and woke this am with headache left arm numbness and lethargic.

## 2023-10-12 NOTE — ED Provider Notes (Signed)
 [place] EMERGENCY DEPARTMENT AT [facility] Provider Note   CSN: [id number] Arrival date & time: 10/12/23  1500     History  Chief Complaint  Patient presents with   Numbness    Jasmine Davies is a 58 y.o. female.  HPI Patient presenting for left arm numbness.  Her medical history includes HTN, TIA, PUD, COPD, anxiety, alcohol abuse.  She states that she went to bed last night at 6 PM in her normal state of health.  This morning, she woke up at 8 AM.  She experienced left arm numbness at the time.  This has been persistent throughout the day.  She additionally has headache and fatigue.  She states that she has had previous CVA, most recently 5 years ago.  At the time, she had left-sided numbness and headache.  Symptoms today seem similar.    Home Medications Prior to Admission medications   Medication Sig Start Date End Date Taking? Authorizing Provider  GAVILYTE-G 236 g solution Take 4,000 mLs by mouth once. 09/14/23  Yes [provider]  albuterol (PROVENTIL) (2.5 MG/3ML) 0.083% nebulizer solution Use up to every 4 hours if neededUse up to every 4 hours if needed Patient not taking: Reported on 09/09/2023 08/26/23   Del Nigel Berthold, FNP  albuterol (VENTOLIN HFA) 108 (90 Base) MCG/ACT inhaler Inhale 2 puffs into the lungs every 6 (six) hours as needed for wheezing or shortness of breath. Patient not taking: Reported on 09/09/2023 08/26/23   Del Nigel Berthold, FNP  amLODipine (NORVASC) 5 MG tablet Take 1 tablet (5 mg total) by mouth daily. 08/26/23   Del Nigel Berthold, FNP  aspirin EC 81 MG tablet Take 81 mg by mouth daily. Patient not taking: Reported on 09/09/2023    [provider]  escitalopram (LEXAPRO) 10 MG tablet Take 1 tablet (10 mg total) by mouth daily. 08/26/23   Del Nigel Berthold, FNP  gabapentin (NEURONTIN) 300 MG capsule Take 1 capsule (300 mg total) by mouth 2 (two) times daily. 08/26/23   Del Nigel Berthold, FNP   lidocaine (LIDODERM) 5 % Place 1 patch onto the skin daily. Remove & Discard patch within 12 hours or as directed by MD Patient not taking: Reported on 09/09/2023 08/09/23   Smoot, Shawn Route, PA-C  methylPREDNISolone (MEDROL DOSEPAK) 4 MG TBPK tablet Take as directed on package Patient not taking: Reported on 09/09/2023 08/09/23   Smoot, Shawn Route, PA-C  mometasone-formoterol (DULERA) 100-5 MCG/ACT AERO Inhale 2 puffs into the lungs 2 (two) times daily. Patient not taking: Reported on 09/09/2023 08/26/23   Del Nigel Berthold, FNP  olmesartan (BENICAR) 20 MG tablet Take 0.5 tablets (10 mg total) by mouth daily. Take half a tablet once daily 08/26/23   Del Newman Nip, Tenna Child, FNP  tiZANidine (ZANAFLEX) 4 MG tablet Take 1 tablet (4 mg total) by mouth every 8 (eight) hours as needed for muscle spasms. 08/26/23   Del Nigel Berthold, FNP      Allergies    Penicillins    Review of Systems   Review of Systems  Constitutional:  Positive for fatigue.  Neurological:  Positive for numbness and headaches.  All other systems reviewed and are negative.   Physical Exam Updated Vital Signs BP (!) 142/73   Pulse 78   Temp 98.7 F (37.1 C)   Resp 18   Ht 4\' 11"  (1.499 m)   Wt 54 kg   SpO2 99%   BMI  24.04 kg/m  Physical Exam Vitals and nursing note reviewed.  Constitutional:      General: She is not in acute distress.    Appearance: Normal appearance. She is well-developed. She is not ill-appearing, toxic-appearing or diaphoretic.  HENT:     Head: Normocephalic and atraumatic.     Left Ear: External ear normal.     Nose: Nose normal.     Mouth/Throat:     Mouth: Mucous membranes are moist.  Eyes:     General: No visual field deficit.    Extraocular Movements: Extraocular movements intact.     Conjunctiva/sclera: Conjunctivae normal.  Cardiovascular:     Rate and Rhythm: Normal rate and regular rhythm.  Pulmonary:     Effort: Pulmonary effort is normal. No respiratory distress.   Abdominal:     General: There is no distension.     Palpations: Abdomen is soft.     Tenderness: There is no abdominal tenderness.  Musculoskeletal:        General: No swelling. Normal range of motion.     Cervical back: Normal range of motion and neck supple.  Skin:    General: Skin is warm and dry.     Capillary Refill: Capillary refill takes less than 2 seconds.     Coloration: Skin is not jaundiced or pale.  Neurological:     Mental Status: She is alert.     Cranial Nerves: No cranial nerve deficit, dysarthria or facial asymmetry.     Sensory: Sensory deficit (Endorses mild diminished sensation in left V2/V3 and left upper extremity.) present.     Motor: Motor function is intact. No weakness or pronator drift.     Coordination: Coordination is intact. Finger-Nose-Finger Test normal.  Psychiatric:        Mood and Affect: Mood normal.        Behavior: Behavior normal.     ED Results / Procedures / Treatments   Labs (all labs ordered are listed, but only abnormal results are displayed) Labs Reviewed  COMPREHENSIVE METABOLIC PANEL - Abnormal; Notable for the following components:      Result Value   Calcium 8.4 (*)    All other components within normal limits  RAPID URINE DRUG SCREEN, HOSP PERFORMED - Abnormal; Notable for the following components:   Tetrahydrocannabinol POSITIVE (*)    All other components within normal limits  URINALYSIS, ROUTINE W REFLEX MICROSCOPIC - Abnormal; Notable for the following components:   Ketones, ur 20 (*)    All other components within normal limits  PROTIME-INR  CBC  DIFFERENTIAL  ETHANOL  MAGNESIUM  I-STAT CHEM 8, ED    EKG None  Radiology CT ANGIO HEAD NECK W WO CM Result Date: 10/12/2023 CLINICAL DATA:  Headache and left arm numbness EXAM: CT ANGIOGRAPHY HEAD AND NECK WITH AND WITHOUT CONTRAST TECHNIQUE: Multidetector CT imaging of the head and neck was performed using the standard protocol during bolus administration of  intravenous contrast. Multiplanar CT image reconstructions and MIPs were obtained to evaluate the vascular anatomy. Carotid stenosis measurements (when applicable) are obtained utilizing NASCET criteria, using the distal internal carotid diameter as the denominator. RADIATION DOSE REDUCTION: This exam was performed according to the departmental dose-optimization program which includes automated exposure control, adjustment of the mA and/or kV according to patient size and/or use of iterative reconstruction technique. CONTRAST:  75mL OMNIPAQUE IOHEXOL 350 MG/ML SOLN COMPARISON:  None Available. FINDINGS: CT HEAD FINDINGS Brain: No mass,hemorrhage or extra-axial collection. Normal appearance of the parenchyma  and CSF spaces. Partially empty sella turcica. Vascular: No hyperdense vessel or unexpected vascular calcification. Skull: The visualized skull base, calvarium and extracranial soft tissues are normal. Sinuses/Orbits: No fluid levels or advanced mucosal thickening of the visualized paranasal sinuses. No mastoid or middle ear effusion. Normal orbits. CTA NECK FINDINGS Skeleton: No acute abnormality or high grade bony spinal canal stenosis. Other neck: Normal pharynx, larynx and major salivary glands. No cervical lymphadenopathy. Unremarkable thyroid gland. Upper chest: No pneumothorax or pleural effusion. No nodules or masses. Aortic arch: There is calcific atherosclerosis of the aortic arch. Conventional 3 vessel aortic branching pattern. RIGHT carotid system: Normal without aneurysm, dissection or stenosis. LEFT carotid system: No dissection, occlusion or aneurysm. Mild atherosclerotic calcification at the carotid bifurcation without hemodynamically significant stenosis. Vertebral arteries: Left dominant configuration. There is no dissection, occlusion or flow-limiting stenosis to the skull base (V1-V3 segments). CTA HEAD FINDINGS POSTERIOR CIRCULATION: Vertebral arteries are normal. No proximal occlusion of the  anterior or inferior cerebellar arteries. Basilar artery is normal. Superior cerebellar arteries are normal. Posterior cerebral arteries are normal. ANTERIOR CIRCULATION: Intracranial internal carotid arteries are normal. Anterior cerebral arteries are normal. Middle cerebral arteries are normal. Venous sinuses: As permitted by contrast timing, patent. Anatomic variants: None Review of the MIP images confirms the above findings. IMPRESSION: 1. No emergent large vessel occlusion or hemodynamically significant stenosis of the head or neck. Electronically Signed   By: Deatra Robinson M.D.   On: 10/12/2023 18:40   MR BRAIN WO CONTRAST Result Date: 10/12/2023 CLINICAL DATA:  Provided history: Neuro deficit, acute, stroke suspected. Additional history provided: Headache, left arm numbness. EXAM: MRI HEAD WITHOUT CONTRAST TECHNIQUE: Multiplanar, multiecho pulse sequences of the brain and surrounding structures were obtained without intravenous contrast. COMPARISON:  Non-contrast head CT 09/06/2022.  Brain MRI 07/03/2019. FINDINGS: Brain: No age-advanced or lobar predominant cerebral atrophy. Several tiny nonspecific foci of T2 FLAIR hyperintense signal abnormality scattered within the bilateral cerebral white matter, nonspecific but most often secondary to chronic small vessel ischemia. Partially empty sella turcica. No cortical encephalomalacia is identified. There is no acute infarct. No evidence of an intracranial mass. No chronic intracranial blood products. No extra-axial fluid collection. No midline shift. Vascular: The non-dominant right vertebral artery is developmentally diminutive. Flow voids preserved elsewhere within the proximal large arterial vessels Skull and upper cervical spine: No focal worrisome marrow lesion. Sinuses/Orbits: No mass or acute finding within the imaged orbits. Trace mucosal thickening, and tiny mucous retention cysts, within the right maxillary sinus. Trace mucosal thickening within the  left maxillary and bilateral sphenoid sinuses. Mild mucosal thickening within the bilateral ethmoid and frontal sinuses. IMPRESSION: 1.  No evidence of an acute intracranial abnormality. 2. Minimal T2 FLAIR hyperintense signal changes within the cerebral white matter, nonspecific but most often secondary to chronic small vessel ischemia. 3. Partially empty sella turcica. This finding can reflect incidental anatomic variation, or alternatively, it can be associated with chronic idiopathic intracranial hypertension (pseudotumor cerebri). 4. Paranasal sinus disease as described. Electronically Signed   By: Jackey Loge D.O.   On: 10/12/2023 16:56    Procedures Procedures    Medications Ordered in ED Medications  metoCLOPramide (REGLAN) injection 10 mg (has no administration in time range)  ketorolac (TORADOL) 15 MG/ML injection 15 mg (has no administration in time range)  diphenhydrAMINE (BENADRYL) injection 12.5 mg (has no administration in time range)  lactated ringers bolus 500 mL (has no administration in time range)  iohexol (OMNIPAQUE) 350 MG/ML injection 75 mL (  75 mLs Intravenous Contrast Given 10/12/23 1700)    ED Course/ Medical Decision Making/ A&P                                 Medical Decision Making Amount and/or Complexity of Data Reviewed Labs: ordered. Radiology: ordered.  Risk Prescription drug management.   This patient presents to the ED for concern of left arm numbness, this involves an extensive number of treatment options, and is a complaint that carries with it a high risk of complications and morbidity.  The differential diagnosis includes CVA, complex migraine, radiculopathy, recrudescence of prior stroke, infection, metabolic arrangements   Co morbidities that complicate the patient evaluation  HTN, TIA, PUD, COPD, anxiety, alcohol abuse   Additional history obtained:  Additional history obtained from EMS External records from outside source obtained and  reviewed including EMR   Lab Tests:  I Ordered, and personally interpreted labs.  The pertinent results include: Normal kidney function, normal electrolytes, normal hemoglobin, no leukocytosis, no evidence of UTI.  UDS positive for THC.   Imaging Studies ordered:  I ordered imaging studies including CTA head and neck, MRI brain I independently visualized and interpreted imaging which showed no acute findings I agree with the radiologist interpretation   Cardiac Monitoring: / EKG:  The patient was maintained on a cardiac monitor.  I personally viewed and interpreted the cardiac monitored which showed an underlying rhythm of: Sinus rhythm   Problem List / ED Course / Critical interventions / Medication management  Patient presenting for mild strokelike symptoms, present since awakening this morning 8 AM.  Last well was 6 PM last night.  On arrival in the ED, she is alert and oriented.  She endorses some mild diminished in station in left side of face and left upper extremity.  No other focal deficits are appreciated.  Concern is for mild acute stroke versus recrudescence of prior stroke, she does state that she had similar symptoms with CVA 5 years ago.  She states that she is not currently on any blood thinners or antiplatelet medication.  Symptoms not consistent with LVO.  Workup was initiated.  MRI did not show any acute findings.  Lab work was unremarkable.  Patient was treated with headache cocktail.  CTA also showed no acute findings.  Patient was discharged in stable condition. I ordered medication including IV fluids, Reglan, Benadryl, Toradol for headache Reevaluation of the patient after these medicines showed that the patient improved I have reviewed the patients home medicines and have made adjustments as needed   Social Determinants of Health:  Lives independently        Final Clinical Impression(s) / ED Diagnoses Final diagnoses:  Bad headache    Rx / DC  Orders ED Discharge Orders     None         Gloris Manchester, MD 10/12/23 2114

## 2023-10-12 NOTE — Telephone Encounter (Signed)
 Chief Complaint: Headache, left side numbness/weakness Symptoms: Headache, left sided numbness/weakness Frequency: when woke up this morning - last known normal before sleep Pertinent Negatives: Patient denies n/a Disposition: [x] ED /[] Urgent Care (no appt availability in office) / [] Appointment(In office/virtual)/ []  [place] [place] [facility]/ [] Home Care/ [] Refused Recommended Disposition /[] [place] [facility]/ []  Follow-up with PCP Additional Notes: Patient called in stating she is having a severe headache and her left arm is numb, since she woke up this morning. Patient states she feels very off. This RN called 911 with patient on the line and EMS dispatch is headed to patient's house. This RN stayed on the phone with patient until I was able to speak with patient's roommate and ensure front door was unlocked and all pets put away. While on line with EMS dispatch, patient was able to smile with both sides of her lips moving upwards, but states when she held arms in front of her that her left arm drooped down. Patient states her breathing feels normal at this time. Patient's roommate got on the phone and stated patient has been preparing for a colonoscopy tomorrow and hasn't had anything to eat or drink and is wondering if that is contributing to symptoms. This RN advised patient and roommate to alert EMS of this as well. Patient confirms she is okay with roommate until EMS arrives.    Copied from CRM [id number]. Topic: Clinical - Red Word Triage >> Oct 12, 2023  2:15 PM Shamecia H wrote: [facility] that prompted transfer to Nurse Triage: Patient called in and said her blood pressure was 164/95 and she is feeling dizzy and has a headache and her left side is numb. Reason for Disposition  [1] Weakness (i.e., paralysis, loss of muscle strength) of the face, arm / hand, or leg / foot on one side of the body AND [2] sudden onset AND [3] present now  (Exception: Bell's palsy suspected [i.e., weakness only on  one side of the face, developing over hours to days, no other symptoms].)  Answer Assessment - Initial Assessment Questions 1. SYMPTOM: "What is the main symptom you are concerned about?" (e.g., weakness, numbness)     Left arm is numb, severe headache 2. ONSET: "When did this start?" (minutes, hours, days; while sleeping)     When patient woke up this morning at 0830 this morning 3. LAST NORMAL: "When was the last time you (the patient) were normal (no symptoms)?"     When patient woke up this morning 4. PATTERN "Does this come and go, or has it been constant since it started?"  "Is it present now?"     Constant 5. CARDIAC SYMPTOMS: "Have you had any of the following symptoms: chest pain, difficulty breathing, palpitations?"     No 6. NEUROLOGIC SYMPTOMS: "Have you had any of the following symptoms: headache, dizziness, vision loss, double vision, changes in speech, unsteady on your feet?"     Headache, left sided arm numbness  7. OTHER SYMPTOMS: "Do you have any other symptoms?"     Headache, left sided numbness and difficulty raising arm  Protocols used: Neurologic Deficit-A-AH

## 2023-10-12 NOTE — Discharge Instructions (Addendum)
 Test results today are reassuring.  Follow-up with your neurologist in the office.  If you need to establish care with a neurologist, there is a telephone number below to call.  Return to the emergency department for any new or worsening symptoms of concern.

## 2023-10-12 NOTE — Therapy (Signed)
 Just saw per chart review that patient is in the ED at [facility]  3:13 PM, 10/12/23 Lorene Klimas Small Keyshun Elpers MPT [facility] [place] [phone]

## 2023-10-12 NOTE — Telephone Encounter (Signed)
 Left message regarding missed appointment for physical therapy evaluation.  Requested a call back if she would like to reschedule.   2:51 PM, 10/12/23 Jerilyn Gillaspie Small Curtez Brallier MPT [facility] [place] [phone]

## 2023-10-13 ENCOUNTER — Ambulatory Visit: Payer: Medicaid Other | Admitting: Orthopedic Surgery

## 2023-10-18 ENCOUNTER — Ambulatory Visit (INDEPENDENT_AMBULATORY_CARE_PROVIDER_SITE_OTHER): Payer: Medicaid Other | Admitting: Orthopedic Surgery

## 2023-10-18 ENCOUNTER — Telehealth: Payer: Self-pay | Admitting: Orthopedic Surgery

## 2023-10-18 ENCOUNTER — Other Ambulatory Visit (INDEPENDENT_AMBULATORY_CARE_PROVIDER_SITE_OTHER): Payer: Self-pay

## 2023-10-18 ENCOUNTER — Encounter: Payer: Self-pay | Admitting: Emergency Medicine

## 2023-10-18 DIAGNOSIS — G8929 Other chronic pain: Secondary | ICD-10-CM

## 2023-10-18 DIAGNOSIS — M25512 Pain in left shoulder: Secondary | ICD-10-CM

## 2023-10-18 NOTE — Telephone Encounter (Signed)
I tried to call but VM is full  

## 2023-10-18 NOTE — Telephone Encounter (Signed)
 Patient called. Says she had MRI last week.

## 2023-10-18 NOTE — Progress Notes (Signed)
 Orthopedic Spine Surgery Office Note   Assessment: Patient is a 58 y.o. female with left trapezius and shoulder pain.  Her symptoms that sound more radicular in nature have resolved.  Based on her history and exam today, this sounds more like shoulder as the etiology     Plan: -Patient has tried PT, Tylenol, gabapentin, Medrol Dosepak, tramadol -Patient has now done over six weeks of conservative treatment without any relief, so ordered an MRI of the shoulder to evaluate further -Would need to be nicotine free prior to any elective spine surgery -Patient should return to office in 4 weeks, x-rays at next visit: none     Patient expressed understanding of the plan and all questions were answered to the patient's satisfaction.    ___________________________________________________________________________     History:   Patient is a 58 y.o. female who presents today for follow up on her cervical spine.  Patient had been previously having neck pain that radiated to the left upper extremity.  She felt a going along the lateral aspect of the arm and dorsal forearm.  She is now experiencing more pain in the left trapezius and left lateral shoulder.  She does not have the pain radiating past the shoulder.  She notes that the pain is really only present when she is using the left arm particularly doing overhead activity.  She does not have any pain at rest.  She has no pain radiating to the right upper extremity.  No numbness or paresthesias.    General: no acute distress, appears stated age Neurologic: alert, answering questions appropriately, following commands Respiratory: unlabored breathing on room air, symmetric chest rise Psychiatric: appropriate affect, normal cadence to speech     MSK (spine):   -Strength exam                                                   Left                  Right Grip strength                5/5                  5/5 Interosseus                  5/5                   5/5 Wrist extension            5/5                  5/5 Wrist flexion                 5/5                  5/5 Elbow flexion                5/5                  5/5 Deltoid                          5/5                  5/5   EHL  5/5                  5/5 TA                                 5/5                  5/5 GSC                             5/5                  5/5 Knee extension            5/5                  5/5 Hip flexion                    5/5                  5/5   -Sensory exam                           Sensation intact to light touch in L2-S1 nerve distributions of bilateral lower extremities             Sensation intact to light touch in C4-T1 nerve distributions of bilateral upper extremities   -Brachioradialis DTR: 2/4 on the left, 2/4 on the right -Biceps DTR: 2/4 on the left, 2/4 on the right -Triceps DTR: 2/4 on the left, 2/4 on the right -Achilles DTR: 2/4 on the left, 2/4 on the right -Patellar tendon DTR: 2/4 on the left, 2/4 on the right   -Spurling: negative bilaterally -Hoffman sign: negative bilaterally -Clonus: no beats bilaterally -Interosseous wasting: none seen -Grip and release test: negative -Romberg: negative -Gait: normal   Left shoulder exam: Pain with external rotation past 70 degrees, pain with Jobe and slight weakness, no weakness with external rotation with arm at side, negative belly press Right shoulder exam: no pain through range of motion    Imaging: XRs of the cervical spine from 08/06/2023 were previously independently reviewed and interpreted, showing anterior osteophyte formation at C5/6. Disc height loss and anterior osteophyte formation at C6/7. No fracture or dislocation seen. Neutral alignment.   XRs of the left shoulder from 10/18/2023 were independently reviewed and interpreted, showing no significant degenerative changes seen within the joint. No dislocation seen. No fracture seen.      Patient name: Jasmine Davies Patient MRN: [id number] Date of visit: 10/18/23

## 2023-10-19 ENCOUNTER — Encounter (HOSPITAL_COMMUNITY): Payer: Self-pay | Admitting: Gastroenterology

## 2023-10-19 ENCOUNTER — Ambulatory Visit (HOSPITAL_COMMUNITY)
Admission: RE | Admit: 2023-10-19 | Discharge: 2023-10-19 | Disposition: A | Discharge status: Home / Self Care | Payer: Medicaid Other | Attending: Gastroenterology | Admitting: Gastroenterology

## 2023-10-19 ENCOUNTER — Ambulatory Visit (HOSPITAL_COMMUNITY): Admitting: Anesthesiology

## 2023-10-19 ENCOUNTER — Other Ambulatory Visit: Payer: Self-pay

## 2023-10-19 ENCOUNTER — Encounter (HOSPITAL_COMMUNITY)
Admission: RE | Disposition: A | Discharge status: Home / Self Care | Payer: Self-pay | Source: Home / Self Care | Attending: Gastroenterology

## 2023-10-19 DIAGNOSIS — Z8711 Personal history of peptic ulcer disease: Secondary | ICD-10-CM | POA: Insufficient documentation

## 2023-10-19 DIAGNOSIS — I10 Essential (primary) hypertension: Secondary | ICD-10-CM | POA: Insufficient documentation

## 2023-10-19 DIAGNOSIS — F419 Anxiety disorder, unspecified: Secondary | ICD-10-CM | POA: Insufficient documentation

## 2023-10-19 DIAGNOSIS — Z1211 Encounter for screening for malignant neoplasm of colon: Secondary | ICD-10-CM | POA: Insufficient documentation

## 2023-10-19 DIAGNOSIS — K649 Unspecified hemorrhoids: Secondary | ICD-10-CM | POA: Diagnosis not present

## 2023-10-19 DIAGNOSIS — K5730 Diverticulosis of large intestine without perforation or abscess without bleeding: Secondary | ICD-10-CM | POA: Diagnosis not present

## 2023-10-19 DIAGNOSIS — F32A Depression, unspecified: Secondary | ICD-10-CM | POA: Insufficient documentation

## 2023-10-19 DIAGNOSIS — K644 Residual hemorrhoidal skin tags: Secondary | ICD-10-CM | POA: Diagnosis not present

## 2023-10-19 DIAGNOSIS — K648 Other hemorrhoids: Secondary | ICD-10-CM | POA: Diagnosis not present

## 2023-10-19 DIAGNOSIS — F17210 Nicotine dependence, cigarettes, uncomplicated: Secondary | ICD-10-CM | POA: Insufficient documentation

## 2023-10-19 HISTORY — PX: COLONOSCOPY WITH PROPOFOL: SHX5780

## 2023-10-19 LAB — HM COLONOSCOPY

## 2023-10-19 SURGERY — COLONOSCOPY WITH PROPOFOL
Anesthesia: General

## 2023-10-19 MED ORDER — LIDOCAINE HCL (CARDIAC) PF 100 MG/5ML IV SOSY
PREFILLED_SYRINGE | INTRAVENOUS | Status: DC | PRN
  Administered 2023-10-19: 60 mg via INTRAVENOUS

## 2023-10-19 MED ORDER — PROPOFOL 10 MG/ML IV BOLUS
INTRAVENOUS | Status: DC | PRN
Start: 2023-10-19 — End: 2023-10-19
  Administered 2023-10-19: 50 mg via INTRAVENOUS
  Administered 2023-10-19: 100 ug/kg/min via INTRAVENOUS

## 2023-10-19 MED ORDER — LACTATED RINGERS IV SOLN: INTRAVENOUS | Status: DC | PRN

## 2023-10-19 MED ORDER — POLYETHYLENE GLYCOL 3350 17 GM/SCOOP PO POWD: 8.5000 g | Freq: Every day | ORAL | Status: DC

## 2023-10-19 MED ORDER — LACTATED RINGERS IV SOLN: INTRAVENOUS | Status: DC

## 2023-10-19 NOTE — Telephone Encounter (Signed)
 I called and lmovm that the test she had last week in different from what we have ordered for Dr. Christell Constant.

## 2023-10-19 NOTE — Op Note (Signed)
 [facility] Patient Name: Jasmine Davies Procedure Date: 10/19/2023 1:25 PM MRN: [id number] Date of Birth: [birth year] Attending MD: Sanjuan Dame , MD, [id number] CSN: [id number] Age: 58 Admit Type: Outpatient Procedure:                Colonoscopy Indications:              Screening for colorectal malignant neoplasm Providers:                Sanjuan Dame, MD, Francoise Ceo RN, RN, Judeth Cornfield.                            Jessee Avers, Technician Referring MD:              Medicines:                Monitored Anesthesia Care Complications:            No immediate complications. Estimated Blood Loss:     Estimated blood loss: none. Procedure:                Pre-Anesthesia Assessment:                           - Prior to the procedure, a History and Physical                            was performed, and patient medications and                            allergies were reviewed. The patient's tolerance of                            previous anesthesia was also reviewed. The risks                            and benefits of the procedure and the sedation                            options and risks were discussed with the patient.                            All questions were answered, and informed consent                            was obtained. Prior Anticoagulants: The patient has                            taken no anticoagulant or antiplatelet agents. ASA                            Grade Assessment: II - A patient with mild systemic                            disease. After reviewing the risks and benefits,  the patient was deemed in satisfactory condition to                            undergo the procedure.                           After obtaining informed consent, the colonoscope                            was passed under direct vision. Throughout the                            procedure, the patient's blood pressure, pulse, and                             oxygen saturations were monitored continuously. The                            [id number]) scope was introduced through the                            anus and advanced to the the cecum, identified by                            appendiceal orifice and ileocecal valve. The                            colonoscopy was performed without difficulty. The                            patient tolerated the procedure well. The quality                            of the bowel preparation was adequate to identify                            polyps greater than 5 mm in size. The ileocecal                            valve, appendiceal orifice, and rectum were                            photographed. The quality of the bowel preparation                            was evaluated using the BBPS [facility] Bowel                            Preparation Scale) with scores of: Right Colon = 2                            (minor amount of residual staining, small fragments  of stool and/or opaque liquid, but mucosa seen                            well), Transverse Colon = 2 (minor amount of                            residual staining, small fragments of stool and/or                            opaque liquid, but mucosa seen well) and Left Colon                            = 2 (minor amount of residual staining, small                            fragments of stool and/or opaque liquid, but mucosa                            seen well). The total BBPS score equals 6. Scope In: 1:55:00 PM Scope Out: 2:11:34 PM Scope Withdrawal Time: 0 hours 11 minutes 13 seconds  Total Procedure Duration: 0 hours 16 minutes 34 seconds  Findings:      The perianal and digital rectal examinations were normal.      A moderate amount of stool was found in the entire colon, precluding       visualization. Lavage of the area was performed using a large amount of       sterile water, resulting in clearance with fair  visualization.      Scattered diverticula were found in the left colon.      Non-bleeding external and internal hemorrhoids were found during       retroflexion. The hemorrhoids were small. Impression:               - Stool in the entire examined colon.                           - Diverticulosis in the left colon.                           - Non-bleeding external and internal hemorrhoids.                           - No specimens collected. Moderate Sedation:      Per Anesthesia Care Recommendation:           - Patient has a contact number available for                            emergencies. The signs and symptoms of potential                            delayed complications were discussed with the                            patient. Return to normal activities tomorrow.  Written discharge instructions were provided to the                            patient.                           - High fiber diet.                           - Continue present medications.                           - Repeat colonoscopy in 3 years for screening                            purposes given fair prep with extended bowel prep                           - Return to primary care physician as previously                            scheduled. Procedure Code(s):        --- Professional ---                           N0272, Colorectal cancer screening; colonoscopy on                            individual not meeting criteria for high risk Diagnosis Code(s):        --- Professional ---                           Z12.11, Encounter for screening for malignant                            neoplasm of colon                           K64.8, Other hemorrhoids                           K57.30, Diverticulosis of large intestine without                            perforation or abscess without bleeding CPT copyright 2022 American Medical Association. All rights reserved. The codes documented in this  report are preliminary and upon coder review may  be revised to meet current compliance requirements. Sanjuan Dame, MD Sanjuan Dame, MD 10/19/2023 2:15:28 PM This report has been signed electronically. Number of Addenda: 0

## 2023-10-19 NOTE — Discharge Instructions (Signed)

## 2023-10-19 NOTE — H&P (Signed)
 Primary Care Physician:  Rica Records, FNP Primary Gastroenterologist:  Dr. Tasia Catchings  Pre-Procedure History & Physical: HPI:  Jasmine Davies is a 58 y.o. female is here for a colonoscopy for colon cancer screening purposes.  Patient denies any family history of colorectal cancer.  No melena or hematochezia.  No abdominal pain or unintentional weight loss.  No change in bowel habits.  Overall feels well from a GI standpoint.  Past Medical History:  Diagnosis Date   Chronic back pain    Chronic pain in left foot    Cleft foot L foot   Degenerative joint disease    Depression 2003   Hernia, hiatal    Hiatal hernia    Hypertension    PUD (peptic ulcer disease)    ? ETOH and ASA use   Sciatica    Stroke [facility])     Past Surgical History:  Procedure Laterality Date   CESAREAN SECTION     x2   CHOLECYSTECTOMY  1993   CLUB FOOT RELEASE     left foot   ESOPHAGOGASTRODUODENOSCOPY  10/06/2011   ulcers,multiple in antrum and duodenitis/hiatal hernia   ESOPHAGOGASTRODUODENOSCOPY  12/15/2011   SLF: Moderate gastritis/ Hiatal hernia/PUD IMPROVED BUT NOT RESOLVED DUE TO ONGOING ASA USE   TUBAL LIGATION  1999    Prior to Admission medications   Medication Sig Start Date End Date Taking? Authorizing Provider  albuterol (PROVENTIL) (2.5 MG/3ML) 0.083% nebulizer solution Use up to every 4 hours if neededUse up to every 4 hours if needed 08/26/23  Yes Del Newman Nip, Tenna Child, FNP  albuterol (VENTOLIN HFA) 108 (90 Base) MCG/ACT inhaler Inhale 2 puffs into the lungs every 6 (six) hours as needed for wheezing or shortness of breath. 08/26/23  Yes Del Newman Nip, Tenna Child, FNP  amLODipine (NORVASC) 5 MG tablet Take 1 tablet (5 mg total) by mouth daily. 08/26/23  Yes Del Newman Nip, Tenna Child, FNP  escitalopram (LEXAPRO) 10 MG tablet Take 1 tablet (10 mg total) by mouth daily. 08/26/23  Yes Del Newman Nip, Tenna Child, FNP  gabapentin (NEURONTIN) 300 MG capsule Take 1 capsule (300 mg total) by mouth 2  (two) times daily. 08/26/23  Yes Del Newman Nip, Tenna Child, FNP  GAVILYTE-G 236 g solution Take 4,000 mLs by mouth once. 09/14/23  Yes [provider]  lidocaine (LIDODERM) 5 % Place 1 patch onto the skin daily. Remove & Discard patch within 12 hours or as directed by MD 08/09/23  Yes Smoot, Shawn Route, PA-C  mometasone-formoterol (DULERA) 100-5 MCG/ACT AERO Inhale 2 puffs into the lungs 2 (two) times daily. 08/26/23  Yes Del Newman Nip, Tenna Child, FNP  olmesartan (BENICAR) 20 MG tablet Take 0.5 tablets (10 mg total) by mouth daily. Take half a tablet once daily 08/26/23  Yes Del Newman Nip, [place], [place]  aspirin EC 81 MG tablet Take 81 mg by mouth daily. Patient not taking: Reported on 09/09/2023    [provider]  methylPREDNISolone (MEDROL DOSEPAK) 4 MG TBPK tablet Take as directed on package Patient not taking: Reported on 09/09/2023 08/09/23   Smoot, Shawn Route, PA-C  tiZANidine (ZANAFLEX) 4 MG tablet Take 1 tablet (4 mg total) by mouth every 8 (eight) hours as needed for muscle spasms. 08/26/23   Del Nigel Berthold, FNP    Allergies as of 09/14/2023 - Review Complete 09/09/2023  Allergen Reaction Noted   Penicillins Rash 09/20/2012    Family History  Problem Relation Age of Onset   Hypertension Mother  Heart failure Father        CHF   Alcohol abuse Father    Cancer Father        Stomach Cancer   Colon cancer Neg Hx     Social History   Socioeconomic History   Marital status: Divorced    Spouse name: Not on file   Number of children: Not on file   Years of education: Not on file   Highest education level: Not on file  Occupational History   Not on file  Tobacco Use   Smoking status: Every Day    Current packs/day: 0.50    Average packs/day: 0.5 packs/day for 39.8 years (19.9 ttl pk-yrs)    Types: Cigarettes    Start date: 12/20/1983   Smokeless tobacco: Never   Tobacco comments:    A pack every 2-3 day, 08/06/2023, jb, cma  Vaping Use   Vaping status:  Never Used  Substance and Sexual Activity   Alcohol use: Not Currently    Alcohol/week: 67.0 standard drinks of alcohol    Types: 67 Cans of beer per week    Comment: 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 08/09/23   Drug use: Not Currently    Types: Marijuana   Sexual activity: Yes    Birth control/protection: None, Surgical  Other Topics Concern   Not on file  Social History Narrative   Not on file   Social Drivers of Health   Financial Resource Strain: Not on file  Food Insecurity: Not on file  Transportation Needs: Not on file  Physical Activity: Not on file  Stress: Not on file  Social Connections: Not on file  Intimate Partner Violence: Not on file    Review of Systems: See HPI, otherwise negative ROS  Physical Exam: Vital signs in last 24 hours:     General:   Alert,  Well-developed, well-nourished, pleasant and cooperative in NAD Head:  Normocephalic and atraumatic. Eyes:  Sclera clear, no icterus.   Conjunctiva pink. Ears:  Normal auditory acuity. Nose:  No deformity, discharge,  or lesions. Msk:  Symmetrical without gross deformities. Normal posture. Extremities:  Without clubbing or edema. Neurologic:  Alert and  oriented x4;  grossly normal neurologically. Skin:  Intact without significant lesions or rashes. Psych:  Alert and cooperative. Normal mood and affect.  Impression/Plan: Jasmine Davies is here for a colonoscopy to be performed for colon cancer screening purposes.  The risks of the procedure including infection, bleed, or perforation as well as benefits, limitations, alternatives and imponderables have been reviewed with the patient. Questions have been answered. All parties agreeable.

## 2023-10-19 NOTE — Anesthesia Preprocedure Evaluation (Addendum)
 Anesthesia Evaluation  Patient identified by MRN, date of birth, ID band Patient awake    Reviewed: Allergy & Precautions, H&P , NPO status , Patient's Chart, lab work & pertinent test results, reviewed documented beta blocker date and time   Airway Mallampati: II  TM Distance: >3 FB Neck ROM: full    Dental no notable dental hx.    Pulmonary COPD, Current Smoker   Pulmonary exam normal breath sounds clear to auscultation       Cardiovascular Exercise Tolerance: Good hypertension,  Rhythm:regular Rate:Normal     Neuro/Psych  PSYCHIATRIC DISORDERS Anxiety Depression    TIA Neuromuscular disease CVA negative neurological ROS  negative psych ROS   GI/Hepatic negative GI ROS, Neg liver ROS, hiatal hernia, PUD,,,  Endo/Other  negative endocrine ROS    Renal/GU negative Renal ROS  negative genitourinary   Musculoskeletal   Abdominal   Peds  Hematology negative hematology ROS (+)   Anesthesia Other Findings   Reproductive/Obstetrics negative OB ROS                              Anesthesia Physical Anesthesia Plan  ASA: 2  Anesthesia Plan: General   Post-op Pain Management:    Induction:   PONV Risk Score and Plan: Propofol infusion  Airway Management Planned:   Additional Equipment:   Intra-op Plan:   Post-operative Plan:   Informed Consent: I have reviewed the patients History and Physical, chart, labs and discussed the procedure including the risks, benefits and alternatives for the proposed anesthesia with the patient or authorized representative who has indicated his/her understanding and acceptance.     Dental Advisory Given  Plan Discussed with: CRNA  Anesthesia Plan Comments:         Anesthesia Quick Evaluation

## 2023-10-19 NOTE — Transfer of Care (Signed)
 Immediate Anesthesia Transfer of Care Note  Patient: Jasmine Davies  Procedure(s) Performed: COLONOSCOPY WITH PROPOFOL  Patient Location: PACU  Anesthesia Type:General  Level of Consciousness: awake, alert , and patient cooperative  Airway & Oxygen Therapy: Patient Spontanous Breathing  Post-op Assessment: Report given to RN, Post -op Vital signs reviewed and stable, and Patient moving all extremities X 4  Post vital signs: Reviewed and stable  Last Vitals:  Vitals Value Taken Time  BP 135/72 10/19/23 1417  Temp 36.8 C 10/19/23 1417  Pulse 82 10/19/23 1417  Resp 15 10/19/23 1417  SpO2 100 % 10/19/23 1417    Last Pain:  Vitals:   10/19/23 1417  TempSrc: Axillary  PainSc: 0-No pain      Patients Stated Pain Goal: 10 (10/19/23 1331)  Complications: No notable events documented.

## 2023-10-20 ENCOUNTER — Encounter (INDEPENDENT_AMBULATORY_CARE_PROVIDER_SITE_OTHER): Payer: Self-pay | Admitting: *Deleted

## 2023-10-20 ENCOUNTER — Ambulatory Visit (HOSPITAL_COMMUNITY)

## 2023-10-20 ENCOUNTER — Encounter (HOSPITAL_COMMUNITY): Payer: Self-pay | Admitting: Gastroenterology

## 2023-10-23 NOTE — Anesthesia Postprocedure Evaluation (Signed)
 Anesthesia Post Note  Patient: Xitlalli Newhard Raya  Procedure(s) Performed: COLONOSCOPY WITH PROPOFOL  Patient location during evaluation: Phase II Anesthesia Type: General Level of consciousness: awake Pain management: pain level controlled Vital Signs Assessment: post-procedure vital signs reviewed and stable Respiratory status: spontaneous breathing and respiratory function stable Cardiovascular status: blood pressure returned to baseline and stable Postop Assessment: no headache and no apparent nausea or vomiting Anesthetic complications: no Comments: Late entry   No notable events documented.   Last Vitals:  Vitals:   10/19/23 1331 10/19/23 1417  BP: 121/70 135/72  Pulse: 68 82  Resp: 19 15  Temp: 36.5 C 36.8 C  SpO2: 99% 100%    Last Pain:  Vitals:   10/19/23 1417  TempSrc: Axillary  PainSc: 0-No pain                 Windell Norfolk

## 2023-10-26 DIAGNOSIS — H268 Other specified cataract: Secondary | ICD-10-CM | POA: Diagnosis not present

## 2023-10-26 DIAGNOSIS — H25812 Combined forms of age-related cataract, left eye: Secondary | ICD-10-CM | POA: Diagnosis not present

## 2023-11-15 ENCOUNTER — Ambulatory Visit (INDEPENDENT_AMBULATORY_CARE_PROVIDER_SITE_OTHER): Admitting: Orthopedic Surgery

## 2023-11-15 DIAGNOSIS — M75112 Incomplete rotator cuff tear or rupture of left shoulder, not specified as traumatic: Secondary | ICD-10-CM | POA: Diagnosis not present

## 2023-11-15 MED ORDER — HYDROCODONE-ACETAMINOPHEN 5-325 MG PO TABS
1.0000 | ORAL_TABLET | Freq: Four times a day (QID) | ORAL | 0 refills | Status: AC | PRN
Start: 2023-11-15 — End: 2023-11-20

## 2023-11-15 NOTE — Progress Notes (Signed)
 Orthopedic Spine Surgery Office Note   Assessment: Patient is a 58 y.o. female with left trapezius and shoulder pain.  Exam and history point towards shoulder as potential etiology     Plan: -Patient has tried PT, Tylenol, gabapentin, Medrol Dosepak, tramadol -Still recommending shoulder MRI to evaluate for rotator cuff pathology. It looks like it has been ordered correctly. Patient just needs to schedule. Provided her with the number to schedule -Prescribed Norco for her pain since she said tramadol is not helping. Told her this would be a one time prescription and she should not expect any refills. This medication has potential for addiction -Would need to be nicotine free prior to any elective spine surgery -Will call patient with the results of the MRI to decide on next steps in treatment     Patient expressed understanding of the plan and all questions were answered to the patient's satisfaction.    ___________________________________________________________________________     History:   Patient is a 58 y.o. female who presents today for follow up on her left shoulder pain.  Patient is still having left trapezius and lateral shoulder pain.  She is not having any pain radiating past the shoulder.  She notes the pain is worse with activity, particularly overhead activity.  She does not have as much pain at rest.  She has no pain radiating into the right upper extremity.  No numbness or paresthesias.  She has not developed any new symptoms since she was last seen in the office.     General: no acute distress, appears stated age Neurologic: alert, answering questions appropriately, following commands Respiratory: unlabored breathing on room air, symmetric chest rise Psychiatric: appropriate affect, normal cadence to speech     MSK (spine):   -Strength exam                                                   Left                  Right Grip strength                5/5                   5/5 Interosseus                  5/5                  5/5 Wrist extension            5/5                  5/5 Wrist flexion                 5/5                  5/5 Elbow flexion                5/5                  5/5 Deltoid                          4/5                  5/5   -Sensory exam  Sensation intact to light touch in C4-T1 nerve distributions of bilateral upper extremities   Left shoulder exam: Pain with external rotation past 70 degrees, pain with Jobe and weakness, no weakness with external rotation with arm at side, negative belly press Right shoulder exam: no pain through range of motion     Imaging: XRs of the cervical spine from 08/06/2023 were previously independently reviewed and interpreted, showing anterior osteophyte formation at C5/6. Disc height loss and anterior osteophyte formation at C6/7. No fracture or dislocation seen. Neutral alignment.    XRs of the left shoulder from 10/18/2023 were previously independently reviewed and interpreted, showing no significant degenerative changes seen within the joint. No dislocation seen. No fracture seen.     Patient name: Jasmine Davies Patient MRN: [id number] Date of visit: 11/15/23

## 2023-11-18 DIAGNOSIS — H268 Other specified cataract: Secondary | ICD-10-CM | POA: Diagnosis not present

## 2023-11-18 DIAGNOSIS — J449 Chronic obstructive pulmonary disease, unspecified: Secondary | ICD-10-CM | POA: Diagnosis not present

## 2023-11-18 DIAGNOSIS — F17219 Nicotine dependence, cigarettes, with unspecified nicotine-induced disorders: Secondary | ICD-10-CM | POA: Diagnosis not present

## 2023-11-18 DIAGNOSIS — I10 Essential (primary) hypertension: Secondary | ICD-10-CM | POA: Diagnosis not present

## 2023-11-18 DIAGNOSIS — H25811 Combined forms of age-related cataract, right eye: Secondary | ICD-10-CM | POA: Diagnosis not present

## 2023-11-22 ENCOUNTER — Ambulatory Visit (INDEPENDENT_AMBULATORY_CARE_PROVIDER_SITE_OTHER): Admitting: Acute Care

## 2023-11-22 ENCOUNTER — Encounter

## 2023-11-22 DIAGNOSIS — F17210 Nicotine dependence, cigarettes, uncomplicated: Secondary | ICD-10-CM

## 2023-11-22 NOTE — Patient Instructions (Signed)

## 2023-11-22 NOTE — Progress Notes (Addendum)
 Provider Attestation I agree with the documentation of the Shared Decision Making visit,  smoking cessation counseling if appropriate, and verification or eligibility for lung cancer screening as documented by the RN Nurse Navigator.   Raejean Bullock, MSN, AGACNP-BC [facility] See Amion for personal pager PCCM on call pager [phone]       Virtual Visit via Telephone Note  I connected with Jasmine Davies on 11/22/23 at  3:30 PM EDT by telephone and verified that I am speaking with the correct person using two identifiers.  Location: Patient: Jasmine Davies Provider: Alyse Bach, RN   I discussed the limitations, risks, security and privacy concerns of performing an evaluation and management service by telephone and the availability of in person appointments. I also discussed with the patient that there may be a patient responsible charge related to this service. The patient expressed understanding and agreed to proceed.    Shared Decision Making Visit Lung Cancer Screening Program [phone])   Eligibility: Age 58 y.o. Pack Years Smoking History Calculation 32 (# packs/per year x # years smoked) Recent History of coughing up blood  no Unexplained weight loss? no ( >Than 15 pounds within the last 6 months ) Prior History Lung / other cancer no (Diagnosis within the last 5 years already requiring surveillance chest CT Scans). Smoking Status Current Smoker Former Smokers: Years since quit: n/a  Quit Date: n/a  Visit Components: Discussion included one or more decision making aids. yes Discussion included risk/benefits of screening. yes Discussion included potential follow up diagnostic testing for abnormal scans. yes Discussion included meaning and risk of over diagnosis. yes Discussion included meaning and risk of False Positives. yes Discussion included meaning of total radiation exposure. yes  Counseling Included: Importance of adherence  to annual lung cancer LDCT screening. yes Impact of comorbidities on ability to participate in the program. yes Ability and willingness to under diagnostic treatment. yes  Smoking Cessation Counseling: Current Smokers:  Discussed importance of smoking cessation. yes Information about tobacco cessation classes and interventions provided to patient. yes Patient provided with "ticket" for LDCT Scan. no Symptomatic Patient. no  Counseling(Intermediate counseling: > three minutes) [id number] Diagnosis Code: Tobacco Use Z72.0 Asymptomatic Patient yes  Counseling (Intermediate counseling: > three minutes counseling) [id number] Former Smokers:  Discussed the importance of maintaining cigarette abstinence. yes Diagnosis Code: Personal History of Nicotine  Dependence. L87.564 Information about tobacco cessation classes and interventions provided to patient. Yes Patient provided with "ticket" for LDCT Scan. no Written Order for Lung Cancer Screening with LDCT placed in Epic. Yes (CT Chest Lung Cancer Screening Low Dose W/O CM) [id number] Z12.2-Screening of respiratory organs Z87.891-Personal history of nicotine  dependence   Alyse Bach, RN

## 2023-11-23 ENCOUNTER — Ambulatory Visit (HOSPITAL_COMMUNITY)
Admission: RE | Admit: 2023-11-23 | Discharge: 2023-11-23 | Disposition: A | Discharge status: Home / Self Care | Source: Ambulatory Visit | Attending: Acute Care | Admitting: Acute Care

## 2023-11-23 ENCOUNTER — Ambulatory Visit (HOSPITAL_COMMUNITY)

## 2023-11-23 DIAGNOSIS — Z87891 Personal history of nicotine dependence: Secondary | ICD-10-CM | POA: Insufficient documentation

## 2023-11-23 DIAGNOSIS — Z122 Encounter for screening for malignant neoplasm of respiratory organs: Secondary | ICD-10-CM | POA: Insufficient documentation

## 2023-11-23 DIAGNOSIS — F17210 Nicotine dependence, cigarettes, uncomplicated: Secondary | ICD-10-CM | POA: Insufficient documentation

## 2023-11-23 NOTE — Therapy (Signed)
 OUTPATIENT PHYSICAL THERAPY CERVICAL EVALUATION   Patient Name: Jasmine Davies MRN: [id number] DOB:June 29, 1966, 58 y.o., female Today's Date: 11/24/2023  END OF SESSION:  PT End of Session - 11/24/23 1116     Visit Number 1    Authorization Type [place] [employer]    Authorization Time Period seeking auth    Progress Note Due on Visit 10    PT Start Time 1015    PT Stop Time 1100    PT Time Calculation (min) 45 min    Activity Tolerance Patient tolerated treatment well;Patient limited by pain    Behavior During Therapy WFL for tasks assessed/performed             Past Medical History:  Diagnosis Date   Chronic back pain    Chronic pain in left foot    Cleft foot L foot   Degenerative joint disease    Depression 2003   Hernia, hiatal    Hiatal hernia    Hypertension    PUD (peptic ulcer disease)    ? ETOH and ASA use   Sciatica    Stroke [facility])    Past Surgical History:  Procedure Laterality Date   CESAREAN SECTION     x2   CHOLECYSTECTOMY  1993   CLUB FOOT RELEASE     left foot   COLONOSCOPY WITH PROPOFOL N/A 10/19/2023   Procedure: COLONOSCOPY WITH PROPOFOL;  Surgeon: Hargis Lias, MD;  Location: [facility];  Service: Endoscopy;  Laterality: N/A;  12:45pm;asa 1-2   ESOPHAGOGASTRODUODENOSCOPY  10/06/2011   ulcers,multiple in antrum and duodenitis/hiatal hernia   ESOPHAGOGASTRODUODENOSCOPY  12/15/2011   SLF: Moderate gastritis/ Hiatal hernia/PUD IMPROVED BUT NOT RESOLVED DUE TO ONGOING ASA USE   TUBAL LIGATION  1999   Patient Active Problem List   Diagnosis Date Noted   Diverticulosis of colon without hemorrhage 10/19/2023   Neck pain 08/06/2023   Anxiety 08/06/2023   Encounter for Papanicolaou smear of cervix 12/11/2022   COPD ? 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   Hypertensive crisis 07/03/2019   TIA (transient ischemic attack) 07/02/2019   Pain in left foot  12/23/2015   Cigarette smoker 12/23/2015   Primary hypertension 12/23/2015   History of alcohol abuse 12/23/2015   Abdominal pain, other specified site 09/20/2012   Nausea with vomiting 09/20/2012   Abdominal bloating 12/09/2011   PUD (peptic ulcer disease) 12/09/2011   Epigastric pain 10/01/2011   Hematemesis 10/01/2011   Abnormal computerized axial tomography of chest 10/01/2011   Abnormal CT scan 10/01/2011    PCP:    Del Abron Abt, FNP    REFERRING PROVIDER: Diedra Fowler, MD  REFERRING DIAG: M54.12 (ICD-10-CM) - Radiculopathy, cervical region  THERAPY DIAG:  Cervicalgia  Stiffness of cervical spine  Decreased spinal mobility  Rationale for Evaluation and Treatment: Rehabilitation  ONSET DATE: 5-6 months ago  SUBJECTIVE:  SUBJECTIVE STATEMENT: Pt does not recall MOI for neck pain. Pt reports neck pain came on about 5-6 months ago. Pain comes and goes in the arm, last time pain was radiating to the hand was a couple of months ago. Pt reports pain is only in the neck this morning. Picking up and lifting items is reported to cause pain to travel into the arm. Pt states when she had her stroke her left arm went numb but has gotten better since. Pt reports MRI is scheduled for Friday. Pt also reports driving although not having a license at this time. Pt currently stays with a 65 year old lady who has her there for company and help around the house. Pt reports being denied any more pain medication and has been denied disability at this point.  Hand dominance: Right  PERTINENT HISTORY:  -light stroke, 6 years ago (in hospital for 3 days) -Memory comes and goes -COPD reported  PAIN:  Are you having pain? Yes: NPRS scale: 5/10 Pain location: Left posterior neck  region Pain description: throbbing Aggravating factors: lifting, carrying Relieving factors: goody powder, pain meds,   PRECAUTIONS: None  RED FLAGS: None     WEIGHT BEARING RESTRICTIONS: No  FALLS:  Has patient fallen in last 6 months? 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   PLOF: Independent  PATIENT GOALS: to get the neck pain to go away, be able to carry and lift without pain  NEXT MD VISIT: MRI scheduled Friday  OBJECTIVE:  Note: Objective measures were completed at Evaluation unless otherwise noted.  DIAGNOSTIC FINDINGS:  XRs of the left shoulder from 10/18/2023 were independently reviewed and  interpreted, showing no significant degenerative changes seen within the  joint. No dislocation seen. No fracture seen.  XRs of the cervical spine from 09/01/2022 was independently reviewed and  interpreted, showing C5/6 anterior osteophyte formation. Disc height loss  at C6/7 with anterior osteophyte formation. No fracture or dislocation  seen. No evidence of instability on flexion/extension views.    PATIENT SURVEYS:  NDI 17/50  COGNITION: Overall cognitive status: Impaired  SENSATION: Light touch: Impaired , left shoulder decreased sensation  POSTURE: rounded shoulders, forward head, and increased thoracic kyphosis  PALPATION: Pt is tender to palpation from C2-T7, pt also demonstrates increased tone in bilateral upper traps and levator musculature region.   CERVICAL ROM:   Active ROM A/PROM (deg) eval  Flexion 45, pulling on left side of neck  Extension 35, pulling on left side of neck  Right lateral flexion 25, pulling on left side  Left lateral flexion 20, pulling side on left side  Right rotation 69, pain in left side  Left rotation 76, pain in left side    (Blank rows = not tested)  UPPER EXTREMITY ROM:  Active ROM Right eval Left eval  Shoulder flexion [facility]  Shoulder extension [facility]  Shoulder abduction [facility]  Shoulder adduction    Shoulder extension [facility]  Shoulder internal rotation    Shoulder external rotation    Elbow flexion [facility] WFL  Elbow extension [facility] WFL  Wrist flexion    Wrist extension    Wrist ulnar deviation    Wrist radial deviation    Wrist pronation    Wrist supination     (Blank rows =  not tested)  UPPER EXTREMITY MMT:  MMT Right eval Left eval  Shoulder flexion 4 4  Shoulder extension 4 4  Shoulder abduction 4, pain 4, pain  Shoulder adduction    Shoulder extension 4 4  Shoulder internal rotation    Shoulder external rotation    Middle trapezius 3 3-  Lower trapezius 3- 3-  Elbow flexion 4 4  Elbow extension 4 4  Wrist flexion    Wrist extension    Wrist ulnar deviation    Wrist radial deviation    Wrist pronation    Wrist supination    Grip strength     (Blank rows = not tested)  CERVICAL SPECIAL TESTS:  Distraction test: Positive and Positive quadrant test on L side    TREATMENT DATE:  11/24/2023  Evaluation: -ROM measured, Strength assessed, HEP prescribed, pt educated on prognosis, findings, and importance of HEP compliance if given.                                                                                                                                   PATIENT EDUCATION:  Education details: Pt was educated on findings of PT evaluation, prognosis, frequency of therapy visits and rationale, attendance policy, and HEP if given.   Person educated: Patient Education method: Explanation, Verbal cues, and Handouts Education comprehension: verbalized understanding and needs further education  HOME EXERCISE PROGRAM: Access Code: [email or web address] URL: [email or web address] Date: 11/24/2023 Prepared by: Armond Bertin  Exercises - Seated Cervical Retraction  -  1 x daily - 7 x weekly - 3 sets - 10 reps - 3s hold - Seated Upper Trapezius Stretch  - 1 x daily - 7 x weekly - 3 sets - 3 reps - 30s hold - Seated Scapular Retraction  - 1 x daily - 7 x weekly - 3 sets - 10 reps - 3s hold  ASSESSMENT:  CLINICAL IMPRESSION: Patient is a 58 y.o. female who was seen today for physical therapy evaluation and treatment for M54.12 (ICD-10-CM) - Radiculopathy, cervical region.   Patient demonstrates decreased cervical spine ROM, increased pain in neck, and increased tension in bilateral upper trapezius and paraspinals of cervical spine. Patient also suffering from increased frequency and intensity of headaches. Patient also demonstrates tenderness to upper/mid thoracic spinal segments to about T7 vertebrae. Patient requires moderate verbal cuing for relaxation of cervical spine musculature with difficulty. Pt reports decreased symptoms with cervical spine distraction special test and increased symptoms during cervical quadrant test to the left. Pt also demonstrates decreased strength of bilateral trapezius and parascapular musculature. Patient would benefit from skilled physical therapy for decreased headache frequency and intensity, increased strength in paraspinal and other postural musculature (parascapular mm), and improved posture for improved ability to perform ADL without symptom reproduction, return to higher level of function with ADLs, and progress towards therapy goals.   OBJECTIVE IMPAIRMENTS: decreased activity tolerance, decreased coordination, decreased endurance, decreased mobility, decreased ROM,  decreased strength, hypomobility, and pain.   ACTIVITY LIMITATIONS: carrying, lifting, stairs, bed mobility, and locomotion level  PARTICIPATION LIMITATIONS: cleaning, driving, community activity, and occupation  PERSONAL FACTORS: Fitness and Time since onset of injury/illness/exacerbation are also affecting patient's functional outcome.   REHAB POTENTIAL:  Good  CLINICAL DECISION MAKING: Stable/uncomplicated  EVALUATION COMPLEXITY: Low   GOALS: Goals reviewed with patient? No  SHORT TERM GOALS: Target date: 12/08/23  Pt will be independent with HEP in order to demonstrate participation in Physical Therapy POC.  Baseline: Goal status: INITIAL  2.  Pt will report 3/10 pain with cervical mobility in order to demonstrate improved pain with ADLs.  Baseline: 5/10 Goal status: INITIAL  LONG TERM GOALS: Target date: 12/29/23  Pt will report decreased cervicogenic caused headaches to less than 2 per week in order to demonstrate improved quality of life.  Baseline: see objective.  Goal status: INITIAL  2.  Pt will improve cervical ROM (flex/ext/lateral flexion/rotation) by 20 degrees globally in order to demonstrate improved functional ambulatory capacity in community setting.  Baseline: see objective.  Goal status: INITIAL  3.  Pt will improve NDI score by at least 11.75 points in order to demonstrate decreased pain with functional goals and outcomes. Baseline: see objective.  Goal status: INITIAL  4.  Pt will report 1/10 pain with cervical mobility in order to demonstrate reduced pain with ADLs that require use of cervical spine musculature (driving, washing hair, reaching to elevated cabinet).  Baseline: see objective.  Goal status: INITIAL     PLAN:  PT FREQUENCY: 2x/week  PT DURATION: other: 5 weeks  PLANNED INTERVENTIONS: 97110-Therapeutic exercises, 97530- Therapeutic activity, 97112- Neuromuscular re-education, 97535- Self Care, 82956- Manual therapy, Patient/Family education, Stair training, Spinal mobilization, Cryotherapy, and Moist heat  PLAN FOR NEXT SESSION: progress manual therapy to cervical and thoracic spine, progress scapular and cervical spine strengthening   Armond Bertin, PT, DPT [facility] Office: [phone] 11:22 AM, 11/24/23   Managed Medicaid Authorization Request  Visit  Dx Codes: M54.12 ; M54.2 ; M43.6 ;  R26.89   Functional Tool Score: NDI 17/50  For all possible CPT codes, reference the Planned Interventions line above.     Check all conditions that are expected to impact treatment: {Conditions expected to impact treatment:Respiratory disorders   If treatment provided at initial evaluation, no treatment charged due to lack of authorization.

## 2023-11-24 ENCOUNTER — Ambulatory Visit (HOSPITAL_COMMUNITY): Attending: Orthopedic Surgery

## 2023-11-24 ENCOUNTER — Other Ambulatory Visit: Payer: Self-pay

## 2023-11-24 ENCOUNTER — Encounter (HOSPITAL_COMMUNITY): Payer: Self-pay

## 2023-11-24 DIAGNOSIS — M436 Torticollis: Secondary | ICD-10-CM | POA: Insufficient documentation

## 2023-11-24 DIAGNOSIS — R2689 Other abnormalities of gait and mobility: Secondary | ICD-10-CM | POA: Diagnosis present

## 2023-11-24 DIAGNOSIS — M5412 Radiculopathy, cervical region: Secondary | ICD-10-CM | POA: Diagnosis not present

## 2023-11-24 DIAGNOSIS — M542 Cervicalgia: Secondary | ICD-10-CM | POA: Diagnosis present

## 2023-11-26 ENCOUNTER — Ambulatory Visit (HOSPITAL_COMMUNITY)
Admission: RE | Admit: 2023-11-26 | Discharge: 2023-11-26 | Disposition: A | Discharge status: Home / Self Care | Source: Ambulatory Visit | Attending: Orthopedic Surgery | Admitting: Orthopedic Surgery

## 2023-11-26 DIAGNOSIS — S46812A Strain of other muscles, fascia and tendons at shoulder and upper arm level, left arm, initial encounter: Secondary | ICD-10-CM | POA: Diagnosis not present

## 2023-11-26 DIAGNOSIS — M129 Arthropathy, unspecified: Secondary | ICD-10-CM | POA: Diagnosis not present

## 2023-11-26 DIAGNOSIS — G8929 Other chronic pain: Secondary | ICD-10-CM | POA: Diagnosis present

## 2023-11-26 DIAGNOSIS — M25512 Pain in left shoulder: Secondary | ICD-10-CM | POA: Insufficient documentation

## 2023-11-26 DIAGNOSIS — M7582 Other shoulder lesions, left shoulder: Secondary | ICD-10-CM | POA: Diagnosis not present

## 2023-12-08 ENCOUNTER — Encounter: Payer: Self-pay | Admitting: Family Medicine

## 2023-12-08 ENCOUNTER — Ambulatory Visit (INDEPENDENT_AMBULATORY_CARE_PROVIDER_SITE_OTHER): Payer: Medicaid Other | Admitting: Family Medicine

## 2023-12-08 VITALS — BP 130/80 | HR 84 | Ht 59.0 in | Wt 122.1 lb

## 2023-12-08 DIAGNOSIS — F419 Anxiety disorder, unspecified: Secondary | ICD-10-CM

## 2023-12-08 DIAGNOSIS — F32A Depression, unspecified: Secondary | ICD-10-CM | POA: Diagnosis not present

## 2023-12-08 DIAGNOSIS — E038 Other specified hypothyroidism: Secondary | ICD-10-CM

## 2023-12-08 DIAGNOSIS — R7301 Impaired fasting glucose: Secondary | ICD-10-CM | POA: Diagnosis not present

## 2023-12-08 DIAGNOSIS — I10 Essential (primary) hypertension: Secondary | ICD-10-CM

## 2023-12-08 MED ORDER — BUSPIRONE HCL 15 MG PO TABS: 15.0000 mg | ORAL_TABLET | Freq: Two times a day (BID) | ORAL | 2 refills | Status: DC

## 2023-12-08 MED ORDER — BUPROPION HCL ER (XL) 150 MG PO TB24: 150.0000 mg | ORAL_TABLET | Freq: Every day | ORAL | 2 refills | Status: DC

## 2023-12-08 MED ORDER — AMLODIPINE BESYLATE 5 MG PO TABS: 5.0000 mg | ORAL_TABLET | Freq: Every day | ORAL | 1 refills | Status: AC

## 2023-12-08 NOTE — Assessment & Plan Note (Signed)
 Vitals:   12/08/23 0922  BP: 130/80  Controlled continue Amlodipine  5 mg once daily Labs ordered. Discussed with  patient to monitor their blood pressure regularly and maintain a heart-healthy diet rich in fruits, vegetables, whole grains, and low-fat dairy, while reducing sodium intake to less than 2,300 mg per day. Regular physical activity, such as 30 minutes of moderate exercise most days of the week, will help lower blood pressure and improve overall cardiovascular health. Avoiding smoking, limiting alcohol consumption, and managing stress. Take  prescribed medication, & take it as directed and avoid skipping doses. Seek emergency care if your blood pressure is (over 180/100) or you experience chest pain, shortness of breath, or sudden vision changes.Patient verbalizes understanding regarding plan of care and all questions answered. Follow up in   weeks with at home blood pressure to monitor trends.

## 2023-12-08 NOTE — Progress Notes (Signed)
 Established Patient Office Visit   Subjective  Patient ID: Jasmine Davies, female    DOB: 12/04/1965  Age: 58 y.o. MRN: [id number]  Chief Complaint  Patient presents with   Medical Management of Chronic Issues    Would like something to help her focus and calm her nerves. She would like to try Adderall. She states she is having bad thoughts.      She  has a past medical history of Chronic back pain, Chronic pain in left foot, Cleft foot (L foot), Degenerative joint disease, Depression (2003), Hernia, hiatal, Hiatal hernia, Hypertension, PUD (peptic ulcer disease), Sciatica, and Stroke (HCC).  Anxiety Patient presents for an initial evaluation of anxiety symptoms, which began over five years ago and have progressively worsened. Current symptoms include decreased concentration, excessive worry, irritability, muscle tension, restlessness, and shortness of breath. The patient denies chest pain or palpitations. Symptoms are persistent and occur daily, resulting in significant emotional distress and impairment in daily functioning. Triggers include ongoing family conflict and work-related stress. The patient reports averaging 7 hours of sleep per night, with good sleep quality. Risk factors include a history of trauma. The patient's medical history is significant for anxiety/panic attacks and depression. Previous treatment approaches included lifestyle modifications and selective serotonin reuptake inhibitors (SSRIs), which provided only mild relief. Adherence to past treatment has been inconsistent.  Hypertension This is a chronic, well-controlled condition. The patient reports associated symptoms of anxiety and shortness of breath, but denies chest pain, palpitations, or peripheral edema. Coronary artery disease risk factors include a history of tobacco use and a positive family history. Past treatment has included calcium channel blockers, which have provided significant symptom control. The patient  reports good adherence to the current regimen. There is no known history of kidney disease.    Review of Systems  Constitutional:  Negative for chills and fever.  Eyes:  Negative for blurred vision.  Respiratory:  Negative for shortness of breath.   Cardiovascular:  Negative for chest pain and palpitations.  Gastrointestinal:  Negative for abdominal pain.  Genitourinary:  Negative for dysuria.  Musculoskeletal:  Negative for joint pain.  Neurological:  Negative for dizziness and headaches.      Objective:     BP 130/80   Pulse 84   Ht 4\' 11"  (1.499 m)   Wt 122 lb 1.9 oz (55.4 kg)   SpO2 96%   BMI 24.67 kg/m  BP Readings from Last 3 Encounters:  12/08/23 130/80  10/19/23 135/72  10/12/23 (!) 140/75      Physical Exam Vitals reviewed.  Constitutional:      General: She is not in acute distress.    Appearance: Normal appearance. She is not ill-appearing, toxic-appearing or diaphoretic.  HENT:     Head: Normocephalic.  Eyes:     General:        Right eye: No discharge.        Left eye: No discharge.     Conjunctiva/sclera: Conjunctivae normal.  Cardiovascular:     Rate and Rhythm: Normal rate.     Pulses: Normal pulses.     Heart sounds: Normal heart sounds.  Pulmonary:     Effort: Pulmonary effort is normal. No respiratory distress.     Breath sounds: Normal breath sounds.  Abdominal:     General: Bowel sounds are normal.     Palpations: Abdomen is soft.     Tenderness: There is no abdominal tenderness. There is no right CVA tenderness, left CVA tenderness  or guarding.  Skin:    General: Skin is warm and dry.     Capillary Refill: Capillary refill takes less than 2 seconds.  Neurological:     Mental Status: She is alert.     Coordination: Coordination normal.     Gait: Gait normal.  Psychiatric:        Mood and Affect: Mood normal.        Behavior: Behavior normal.      No results found for any visits on 12/08/23.  The ASCVD Risk score (Arnett DK,  et al., 2019) failed to calculate for the following reasons:   Risk score cannot be calculated because patient has a medical history suggesting prior/existing ASCVD    Assessment & Plan:  Primary hypertension Assessment & Plan: Vitals:   12/08/23 0922  BP: 130/80  Controlled continue Amlodipine  5 mg once daily Labs ordered. Discussed with  patient to monitor their blood pressure regularly and maintain a heart-healthy diet rich in fruits, vegetables, whole grains, and low-fat dairy, while reducing sodium intake to less than 2,300 mg per day. Regular physical activity, such as 30 minutes of moderate exercise most days of the week, will help lower blood pressure and improve overall cardiovascular health. Avoiding smoking, limiting alcohol consumption, and managing stress. Take  prescribed medication, & take it as directed and avoid skipping doses. Seek emergency care if your blood pressure is (over 180/100) or you experience chest pain, shortness of breath, or sudden vision changes.Patient verbalizes understanding regarding plan of care and all questions answered. Follow up in   weeks with at home blood pressure to monitor trends.   Orders: -     Lipid panel -     BMP8+eGFR -     amLODIPine  Besylate; Take 1 tablet (5 mg total) by mouth daily.  Dispense: 90 tablet; Refill: 1  TSH (thyroid-stimulating hormone deficiency) -     TSH + free T4  IFG (impaired fasting glucose) -     Hemoglobin A1c  Anxiety and depression -     busPIRone HCl; Take 1 tablet (15 mg total) by mouth 2 (two) times daily.  Dispense: 60 tablet; Refill: 2 -     buPROPion HCl ER (XL); Take 1 tablet (150 mg total) by mouth daily.  Dispense: 30 tablet; Refill: 2  Anxiety Assessment & Plan:    12/08/2023    9:23 AM 08/06/2023    9:28 AM 12/11/2022    9:44 AM 10/30/2022    9:17 AM  GAD 7 : Generalized Anxiety Score  Nervous, Anxious, on Edge 1 2 0 1  Control/stop worrying 1 2 0 0  Worry too much - different things 1 2 1  1   Trouble relaxing 2 1 0 0  Restless 2 1 0 0  Easily annoyed or irritable 2 0 1 1  Afraid - awful might happen 1 0 0 1  Total GAD 7 Score 10 8 2 4   Anxiety Difficulty Somewhat difficult Not difficult at all Not difficult at all Somewhat difficult  Trial on Wellbutrin 150 mg once daily and buspar 15 mg twice daily PRN Continued discussion on lifestyle changes, establishing a daily routine, going outdoors, exercise, healthy eating habits, mindfulness and mediatation.      Return in about 4 months (around 04/08/2024), or if symptoms worsen or fail to improve, for Anxiety, hypertension.   Avelino Lek Amber Bail, FNP

## 2023-12-08 NOTE — Assessment & Plan Note (Signed)
    12/08/2023    9:23 AM 08/06/2023    9:28 AM 12/11/2022    9:44 AM 10/30/2022    9:17 AM  GAD 7 : Generalized Anxiety Score  Nervous, Anxious, on Edge 1 2 0 1  Control/stop worrying 1 2 0 0  Worry too much - different things 1 2 1 1   Trouble relaxing 2 1 0 0  Restless 2 1 0 0  Easily annoyed or irritable 2 0 1 1  Afraid - awful might happen 1 0 0 1  Total GAD 7 Score 10 8 2 4   Anxiety Difficulty Somewhat difficult Not difficult at all Not difficult at all Somewhat difficult  Trial on Wellbutrin 150 mg once daily and buspar 15 mg twice daily PRN Continued discussion on lifestyle changes, establishing a daily routine, going outdoors, exercise, healthy eating habits, mindfulness and mediatation.

## 2023-12-08 NOTE — Patient Instructions (Signed)

## 2023-12-09 LAB — HEMOGLOBIN A1C
Est. average glucose Bld gHb Est-mCnc: 111 mg/dL
Hgb A1c MFr Bld: 5.5 % (ref 4.8–5.6)

## 2023-12-09 LAB — LIPID PANEL
Chol/HDL Ratio: 3.7 ratio (ref 0.0–4.4)
Cholesterol, Total: 147 mg/dL (ref 100–199)
HDL: 40 mg/dL (ref >39)
LDL Chol Calc (NIH): 85 mg/dL (ref 0–99)
Triglycerides: 122 mg/dL (ref 0–149)
VLDL Cholesterol Cal: 22 mg/dL (ref 5–40)

## 2023-12-09 LAB — BMP8+EGFR
BUN/Creatinine Ratio: 9 (ref 9–23)
BUN: 6 mg/dL (ref 6–24)
CO2: 27 mmol/L (ref 20–29)
Calcium: 8.8 mg/dL (ref 8.7–10.2)
Chloride: 101 mmol/L (ref 96–106)
Creatinine, Ser: 0.64 mg/dL (ref 0.57–1.00)
Glucose: 75 mg/dL (ref 70–99)
Potassium: 4.2 mmol/L (ref 3.5–5.2)
Sodium: 141 mmol/L (ref 134–144)
eGFR: 104 mL/min/{1.73_m2} (ref >59)

## 2023-12-09 LAB — TSH+FREE T4
Free T4: 1.27 ng/dL (ref 0.82–1.77)
TSH: 1.23 u[IU]/mL (ref 0.450–4.500)

## 2023-12-09 NOTE — Progress Notes (Signed)
Please inform patient,  All labs normal.

## 2023-12-10 ENCOUNTER — Encounter (HOSPITAL_COMMUNITY)

## 2023-12-10 ENCOUNTER — Encounter (HOSPITAL_COMMUNITY): Payer: Self-pay

## 2023-12-13 ENCOUNTER — Telehealth: Payer: Self-pay | Admitting: Orthopedic Surgery

## 2023-12-13 DIAGNOSIS — M75112 Incomplete rotator cuff tear or rupture of left shoulder, not specified as traumatic: Secondary | ICD-10-CM

## 2023-12-13 NOTE — Telephone Encounter (Signed)
 Orthopedic Office Telephone Note  Tried calling patient this afternoon on both her home and mobile phone.  She did not pick up on either so I left a message on her mobile phone.  I was attempting to go over her MRI results which do show a rotator cuff tear.  For this issue, I am going to refer her to my partner since she has tried multiple conservative treatments without relief of her pain.  Referral was placed today.  She can follow-up with me on an as-needed basis.  Diedra Fowler, MD Orthopedic Surgeon

## 2023-12-13 NOTE — Telephone Encounter (Signed)
 Pt request a call back from Dr Sulema Endo to discuss the message he left her regarding the mri results.

## 2023-12-14 NOTE — Telephone Encounter (Signed)
 noted

## 2023-12-15 ENCOUNTER — Encounter (HOSPITAL_COMMUNITY): Admitting: Physical Therapy

## 2023-12-15 ENCOUNTER — Telehealth (HOSPITAL_COMMUNITY): Payer: Self-pay | Admitting: Physical Therapy

## 2023-12-15 NOTE — Telephone Encounter (Signed)
 Pt did not show for appt.  Called and left voicemail regarding missed appt and reminder for next one on Friday.  Lorenso Romance, PTA/CLT [facility] Ph: [phone]

## 2023-12-16 DIAGNOSIS — F418 Other specified anxiety disorders: Secondary | ICD-10-CM | POA: Diagnosis not present

## 2023-12-16 DIAGNOSIS — H25812 Combined forms of age-related cataract, left eye: Secondary | ICD-10-CM | POA: Diagnosis not present

## 2023-12-16 DIAGNOSIS — J449 Chronic obstructive pulmonary disease, unspecified: Secondary | ICD-10-CM | POA: Diagnosis not present

## 2023-12-16 DIAGNOSIS — F17219 Nicotine dependence, cigarettes, with unspecified nicotine-induced disorders: Secondary | ICD-10-CM | POA: Diagnosis not present

## 2023-12-17 ENCOUNTER — Encounter (HOSPITAL_COMMUNITY)

## 2023-12-20 ENCOUNTER — Telehealth: Payer: Self-pay | Admitting: *Deleted

## 2023-12-20 NOTE — Telephone Encounter (Signed)
 Call report from [facility] Radiology:   IMPRESSION: Lung-RADS 4A, suspicious. Follow up low-dose chest CT without contrast in 3 months (please use the following order, CT CHEST LCS NODULE FOLLOW-UP W/O CM) is recommended.   Peribronchovascular nodularity in the posterior right upper lobe, favoring multifocal pneumonia, but indeterminate. Dominant nodule measures 9.1 mm.   Aortic Atherosclerosis (ICD10-I70.0) and Emphysema (ICD10-J43.9).

## 2023-12-21 ENCOUNTER — Encounter (HOSPITAL_COMMUNITY): Admitting: Physical Therapy

## 2023-12-22 ENCOUNTER — Ambulatory Visit: Admitting: Orthopedic Surgery

## 2023-12-23 ENCOUNTER — Telehealth: Payer: Self-pay | Admitting: Acute Care

## 2023-12-23 ENCOUNTER — Encounter (HOSPITAL_COMMUNITY): Admitting: Physical Therapy

## 2023-12-23 DIAGNOSIS — R911 Solitary pulmonary nodule: Secondary | ICD-10-CM

## 2023-12-23 DIAGNOSIS — Z87891 Personal history of nicotine dependence: Secondary | ICD-10-CM

## 2023-12-23 NOTE — Telephone Encounter (Signed)
 I have attempted to call the patient with the results of their  Low Dose CT Chest Lung cancer screening scan. There was no answer. I have left a HIPPA compliant VM requesting the patient call the office for the scan results. I included the office contact information in the message. We will await their return call. If no return call we will continue to call until patient is contacted.    We need to ask patient is she has been sick when she calls back. The scan was done 11/23/2023. Please order  a 3 month follow up which will be due 02/22/2024 or around then. Please also check on if she has had hemoptysis or unexplained weight loss. That could possibly change the plan. Thanks so much

## 2023-12-23 NOTE — Telephone Encounter (Signed)
 See provider note 12/23/2023

## 2023-12-24 NOTE — Addendum Note (Signed)
 Addended by: Pearl Bottcher D on: 12/24/2023 11:26 AM   Modules accepted: Orders

## 2023-12-24 NOTE — Telephone Encounter (Signed)
 Spoke with patient regarding lung screening CT results. Small nodule noted. Pt denies feeling sick recently however she does report having chest congestion due related to COPD dx. I advised pt that we plan to repeat CT in 3 months. Pt verbalized understanding. Results/ plans faxed to PCP. Order placed for 3 mth nodule f/u CT.

## 2023-12-28 ENCOUNTER — Encounter (HOSPITAL_COMMUNITY): Payer: Self-pay | Admitting: Emergency Medicine

## 2023-12-28 ENCOUNTER — Other Ambulatory Visit: Payer: Self-pay

## 2023-12-28 ENCOUNTER — Emergency Department (HOSPITAL_COMMUNITY)

## 2023-12-28 ENCOUNTER — Encounter (HOSPITAL_COMMUNITY)

## 2023-12-28 ENCOUNTER — Emergency Department (HOSPITAL_COMMUNITY)
Admission: EM | Admit: 2023-12-28 | Discharge: 2023-12-28 | Disposition: A | Discharge status: Home / Self Care | Attending: Emergency Medicine | Admitting: Emergency Medicine

## 2023-12-28 DIAGNOSIS — M25511 Pain in right shoulder: Secondary | ICD-10-CM | POA: Insufficient documentation

## 2023-12-28 DIAGNOSIS — M19011 Primary osteoarthritis, right shoulder: Secondary | ICD-10-CM | POA: Diagnosis not present

## 2023-12-28 DIAGNOSIS — I959 Hypotension, unspecified: Secondary | ICD-10-CM | POA: Diagnosis not present

## 2023-12-28 DIAGNOSIS — M79603 Pain in arm, unspecified: Secondary | ICD-10-CM | POA: Diagnosis not present

## 2023-12-28 MED ORDER — OXYCODONE-ACETAMINOPHEN 5-325 MG PO TABS: 1.0000 | ORAL_TABLET | Freq: Four times a day (QID) | ORAL | 0 refills | Status: DC | PRN

## 2023-12-28 MED ORDER — OXYCODONE-ACETAMINOPHEN 5-325 MG PO TABS
1.0000 | ORAL_TABLET | Freq: Once | ORAL | Status: AC
  Administered 2023-12-28: 1 via ORAL
  Filled 2023-12-28: qty 1

## 2023-12-28 MED ORDER — KETOROLAC TROMETHAMINE 30 MG/ML IJ SOLN
30.0000 mg | Freq: Once | INTRAMUSCULAR | Status: AC
  Administered 2023-12-28: 30 mg via INTRAMUSCULAR
  Filled 2023-12-28: qty 1

## 2023-12-28 NOTE — Discharge Instructions (Signed)
Follow up with your orthopedic doctor as planned.

## 2023-12-28 NOTE — ED Triage Notes (Addendum)
 Pt to ed via [facility] c/o right shoulder and arm pain that started yesterday. 0100 pt was woke up d/t arm pain that has gotten worse. Pt states feels nauseated r/t pain. Arm only hurts when moves. Declines cp, dyspnea, sob. Denies any known injuries

## 2023-12-28 NOTE — ED Provider Notes (Signed)
 [place] EMERGENCY DEPARTMENT AT [facility] Provider Note   CSN: [id number] Arrival date & time: 12/28/23  7829     History  Chief Complaint  Patient presents with   Arm Pain    Jasmine Davies is a 58 y.o. female.  Patient complains of severe right shoulder pain.  She is to see an orthopedic doctor tomorrow.  The history is provided by the patient and medical records. No language interpreter was used.  Arm Pain This is a new problem. The current episode started 2 days ago. The problem occurs constantly. The problem has not changed since onset.Pertinent negatives include no chest pain, no abdominal pain and no headaches. The symptoms are aggravated by twisting. Nothing relieves the symptoms.       Home Medications Prior to Admission medications   Medication Sig Start Date End Date Taking? Authorizing Provider  oxyCODONE -acetaminophen  (PERCOCET/ROXICET) 5-325 MG tablet Take 1 tablet by mouth every 6 (six) hours as needed for severe pain (pain score 7-10). 12/28/23  Yes Margart Zemanek, MD  albuterol  (PROVENTIL ) (2.5 MG/3ML) 0.083% nebulizer solution Use up to every 4 hours if neededUse up to every 4 hours if needed 08/26/23   Del Amber Bail, Rogerio Clay, FNP  albuterol  (VENTOLIN  HFA) 108 (90 Base) MCG/ACT inhaler Inhale 2 puffs into the lungs every 6 (six) hours as needed for wheezing or shortness of breath. 08/26/23   Del Orbe Polanco, Iliana, FNP  amLODipine  (NORVASC ) 5 MG tablet Take 1 tablet (5 mg total) by mouth daily. 12/08/23   Del Abron Abt, FNP  buPROPion  (WELLBUTRIN  XL) 150 MG 24 hr tablet Take 1 tablet (150 mg total) by mouth daily. 12/08/23   Del Abron Abt, FNP  busPIRone  (BUSPAR ) 15 MG tablet Take 1 tablet (15 mg total) by mouth 2 (two) times daily. 12/08/23   Del Orbe Polanco, Iliana, FNP  gabapentin  (NEURONTIN ) 300 MG capsule Take 1 capsule (300 mg total) by mouth 2 (two) times daily. 08/26/23   Del Abron Abt, FNP   mometasone-formoterol (DULERA ) 100-5 MCG/ACT AERO Inhale 2 puffs into the lungs 2 (two) times daily. 08/26/23   Del Orbe Polanco, Iliana, FNP  tiZANidine  (ZANAFLEX ) 4 MG tablet Take 1 tablet (4 mg total) by mouth every 8 (eight) hours as needed for muscle spasms. 08/26/23   Del Abron Abt, FNP      Allergies    Penicillins    Review of Systems   Review of Systems  Constitutional:  Negative for appetite change and fatigue.  HENT:  Negative for congestion, ear discharge and sinus pressure.   Eyes:  Negative for discharge.  Respiratory:  Negative for cough.   Cardiovascular:  Negative for chest pain.  Gastrointestinal:  Negative for abdominal pain and diarrhea.  Genitourinary:  Negative for frequency and hematuria.  Musculoskeletal:  Negative for back pain.       Right shoulder pain  Skin:  Negative for rash.  Neurological:  Negative for seizures and headaches.  Psychiatric/Behavioral:  Negative for hallucinations.     Physical Exam Updated Vital Signs BP (!) 144/102   Pulse 81   Temp 98.1 F (36.7 C) (Oral)   Resp 20   Ht 4\' 11"  (1.499 m)   Wt 55.3 kg   SpO2 95%   BMI 24.64 kg/m  Physical Exam Vitals and nursing note reviewed.  Constitutional:      Appearance: She is well-developed.  HENT:     Head: Normocephalic.     Nose: Nose  normal.  Eyes:     General: No scleral icterus.    Conjunctiva/sclera: Conjunctivae normal.  Neck:     Thyroid: No thyromegaly.  Cardiovascular:     Rate and Rhythm: Normal rate and regular rhythm.     Heart sounds: No murmur heard.    No friction rub. No gallop.  Pulmonary:     Breath sounds: No stridor. No wheezing or rales.  Chest:     Chest wall: No tenderness.  Abdominal:     General: There is no distension.     Tenderness: There is no abdominal tenderness. There is no rebound.  Musculoskeletal:     Cervical back: Neck supple.     Comments: Tender right shoulder with decreased range of motion  Lymphadenopathy:      Cervical: No cervical adenopathy.  Skin:    Findings: No erythema or rash.  Neurological:     Mental Status: She is alert and oriented to person, place, and time.     Motor: No abnormal muscle tone.     Coordination: Coordination normal.     Comments: Neurovascular exam normal in right arm  Psychiatric:        Behavior: Behavior normal.     ED Results / Procedures / Treatments   Labs (all labs ordered are listed, but only abnormal results are displayed) Labs Reviewed - No data to display  EKG None  Radiology DG Shoulder Right Portable Result Date: 12/28/2023 CLINICAL DATA:  Shoulder pain. EXAM: RIGHT SHOULDER - 1 VIEW COMPARISON:  None Available. FINDINGS: No signs of acute fracture or dislocation. Decreased acromial humeral interval. Degenerative changes noted at the glenohumeral and acromioclavicular joint. IMPRESSION: 1. No acute findings. 2. Decreased acromial humeral interval compatible with chronic rotator cuff injury. 3. Glenohumeral and acromioclavicular osteoarthritis. Electronically Signed   By: Kimberley Penman M.D.   On: 12/28/2023 07:30    Procedures Procedures    Medications Ordered in ED Medications  ketorolac  (TORADOL ) 30 MG/ML injection 30 mg (has no administration in time range)  oxyCODONE -acetaminophen  (PERCOCET/ROXICET) 5-325 MG per tablet 1 tablet (1 tablet Oral Given 12/28/23 4540)    ED Course/ Medical Decision Making/ A&P                                 Medical Decision Making Amount and/or Complexity of Data Reviewed Radiology: ordered.  Risk Prescription drug management.   Patient with right rotator cuff inflammation in the right shoulder.  She is given pain medicine and will see orthopedics tomorrow        Final Clinical Impression(s) / ED Diagnoses Final diagnoses:  Acute pain of right shoulder    Rx / DC Orders ED Discharge Orders          Ordered    oxyCODONE -acetaminophen  (PERCOCET/ROXICET) 5-325 MG tablet  Every 6 hours PRN         12/28/23 0804              Cheyenne Cotta, MD 12/31/23 1150

## 2023-12-28 NOTE — ED Notes (Signed)
EKG printed and given to EDP.

## 2023-12-29 ENCOUNTER — Encounter (HOSPITAL_BASED_OUTPATIENT_CLINIC_OR_DEPARTMENT_OTHER): Payer: Self-pay | Admitting: Orthopaedic Surgery

## 2023-12-29 ENCOUNTER — Other Ambulatory Visit (HOSPITAL_BASED_OUTPATIENT_CLINIC_OR_DEPARTMENT_OTHER): Payer: Self-pay

## 2023-12-29 ENCOUNTER — Ambulatory Visit (INDEPENDENT_AMBULATORY_CARE_PROVIDER_SITE_OTHER): Admitting: Orthopaedic Surgery

## 2023-12-29 DIAGNOSIS — M25511 Pain in right shoulder: Secondary | ICD-10-CM

## 2023-12-29 DIAGNOSIS — M75110 Incomplete rotator cuff tear or rupture of unspecified shoulder, not specified as traumatic: Secondary | ICD-10-CM | POA: Diagnosis not present

## 2023-12-29 DIAGNOSIS — M25512 Pain in left shoulder: Secondary | ICD-10-CM

## 2023-12-29 DIAGNOSIS — M75111 Incomplete rotator cuff tear or rupture of right shoulder, not specified as traumatic: Secondary | ICD-10-CM

## 2023-12-29 DIAGNOSIS — M75112 Incomplete rotator cuff tear or rupture of left shoulder, not specified as traumatic: Secondary | ICD-10-CM

## 2023-12-29 MED ORDER — LIDOCAINE HCL 1 % IJ SOLN
4.0000 mL | INTRAMUSCULAR | Status: AC | PRN
  Administered 2023-12-29: 4 mL

## 2023-12-29 MED ORDER — TRIAMCINOLONE ACETONIDE 40 MG/ML IJ SUSP
80.0000 mg | INTRAMUSCULAR | Status: AC | PRN
  Administered 2023-12-29: 80 mg via INTRA_ARTICULAR

## 2023-12-29 NOTE — Progress Notes (Signed)
 Chief Complaint: Bilateral shoulder pain     History of Present Illness:    Jasmine Davies is a 58 y.o. female presents today with right worse than left shoulder pain.  She has been seeing Dr. Sulema Endo for history of left shoulder pain for which an MRI was obtained.  She is here today for further discussion particularly of the right shoulder as she did have a recent fall on this with quite limited overhead range of motion she was placed in a sling in the emergency room.  X-rays were negative.    PMH/PSH/Family History/Social History/Meds/Allergies:    Past Medical History:  Diagnosis Date  . Chronic back pain   . Chronic pain in left foot   . Cleft foot L foot  . Degenerative joint disease   . Depression 2003  . Hernia, hiatal   . Hiatal hernia   . Hypertension   . PUD (peptic ulcer disease)    ? ETOH and ASA use  . Sciatica   . Stroke [facility])    Past Surgical History:  Procedure Laterality Date  . CESAREAN SECTION     x2  . CHOLECYSTECTOMY  1993  . CLUB FOOT RELEASE     left foot  . COLONOSCOPY WITH PROPOFOL  N/A 10/19/2023   Procedure: COLONOSCOPY WITH PROPOFOL ;  Surgeon: Hargis Lias, MD;  Location: [facility] ENDO SUITE;  Service: Endoscopy;  Laterality: N/A;  12:45pm;asa 1-2  . ESOPHAGOGASTRODUODENOSCOPY  10/06/2011   ulcers,multiple in antrum and duodenitis/hiatal hernia  . ESOPHAGOGASTRODUODENOSCOPY  12/15/2011   SLF: Moderate gastritis/ Hiatal hernia/PUD IMPROVED BUT NOT RESOLVED DUE TO ONGOING ASA USE  . TUBAL LIGATION  1999   Social History   Socioeconomic History  . Marital status: Divorced    Spouse name: Not on file  . Number of children: Not on file  . Years of education: Not on file  . Highest education level: Not on file  Occupational History  . Not on file  Tobacco Use  . Smoking status: Every Day    Current packs/day: 0.75    Average packs/day: 0.8 packs/day for 43.0 years (32.3 ttl pk-yrs)    Types: Cigarettes    Start date: 12/19/1980  . Smokeless  tobacco: Never  . Tobacco comments:    A pack every 2-3 day, 08/06/2023, jb, cma  Vaping Use  . Vaping status: Never Used  Substance and Sexual Activity  . 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 Drug use: Not Currently    Types: Marijuana  . Sexual activity: Not Currently    Birth control/protection: None, Surgical  Other Topics Concern  . Not on file  Social History Narrative  . Not on file   Social Drivers of Health   Financial Resource Strain: Not on file  Food Insecurity: Not on file  Transportation Needs: Not on file  Physical Activity: Not on file  Stress: Not on file  Social Connections: Not on file   Family History  Problem Relation Age of Onset  . Hypertension Mother   . Heart failure Father        CHF  . Alcohol abuse Father   . Cancer Father        Stomach Cancer  . Colon cancer Neg Hx    Allergies  Allergen Reactions  .  Penicillins Rash   Current Outpatient Medications  Medication Sig Dispense Refill  . albuterol  (PROVENTIL ) (2.5 MG/3ML) 0.083% nebulizer solution Use up to every 4 hours if neededUse up to every 4 hours if needed    . albuterol  (VENTOLIN  HFA) 108 (90 Base) MCG/ACT inhaler Inhale 2 puffs into the lungs every 6 (six) hours as needed for wheezing or shortness of breath. 8 g 3  . amLODipine  (NORVASC ) 5 MG tablet Take 1 tablet (5 mg total) by mouth daily. 90 tablet 1  . buPROPion  (WELLBUTRIN  XL) 150 MG 24 hr tablet Take 1 tablet (150 mg total) by mouth daily. 30 tablet 2  . busPIRone  (BUSPAR ) 15 MG tablet Take 1 tablet (15 mg total) by mouth 2 (two) times daily. 60 tablet 2  . gabapentin  (NEURONTIN ) 300 MG capsule Take 1 capsule (300 mg total) by mouth 2 (two) times daily. 60 capsule 3  . mometasone-formoterol (DULERA ) 100-5 MCG/ACT AERO Inhale 2 puffs into the lungs 2 (two) times daily. 1 each 3  .  oxyCODONE -acetaminophen  (PERCOCET/ROXICET) 5-325 MG tablet Take 1 tablet by mouth every 6 (six) hours as needed for severe pain (pain score 7-10). 20 tablet 0  . tiZANidine  (ZANAFLEX ) 4 MG tablet Take 1 tablet (4 mg total) by mouth every 8 (eight) hours as needed for muscle spasms. 30 tablet 2   No current facility-administered medications for this visit.   DG Shoulder Right Portable Result Date: 12/28/2023 CLINICAL DATA:  Shoulder pain. EXAM: RIGHT SHOULDER - 1 VIEW COMPARISON:  None Available. FINDINGS: No signs of acute fracture or dislocation. Decreased acromial humeral interval. Degenerative changes noted at the glenohumeral and acromioclavicular joint. IMPRESSION: 1. No acute findings. 2. Decreased acromial humeral interval compatible with chronic rotator cuff injury. 3. Glenohumeral and acromioclavicular osteoarthritis. Electronically Signed   By: Kimberley Penman M.D.   On: 12/28/2023 07:30    Review of Systems:   A ROS was performed including pertinent positives and negatives as documented in the HPI.  Physical Exam :   Constitutional: NAD and appears stated age Neurological: Alert and oriented Psych: Appropriate affect and cooperative There were no vitals taken for this visit.   Comprehensive Musculoskeletal Exam:    Tenderness about the glenohumeral joint bilaterally.  Active forward elevation on the left is 100 degrees compared to 40 on the right with pain.  External rotation at the side is to 20 degrees and painful bilaterally.  Internal rotation deferred today positive Neer impingement   Imaging:   Xray (3 views left shoulder, 3 views right shoulder): Normal  MRI (left shoulder): Bursal sided fraying with obliteration of the inferior glenohumeral pouch   I personally reviewed and interpreted the radiographs.   Assessment and Plan:   58 y.o. female with evidence of left worse than right adhesive capsulitis which is now been ongoing for several months.  Given this I did  recommend bilateral ultrasound-guided injection of the glenohumeral joint.  Will plan to proceed with these today.  I would like to check her progress in a month.  I did give her specific exercises that she can work on for gentle range of motion  -Bilateral glenohumeral ultrasound-guided injections performed after verbal consent obtained    Procedure Note  Patient: Jasmine Davies             Date of Birth: 10/20/1965           MRN: [id number]             Visit Date: 12/29/2023  Procedures: Visit Diagnoses:  1. Incomplete rotator cuff tear or rupture of left shoulder, not specified as traumatic     Large Joint Inj: R glenohumeral on 12/29/2023 5:07 PM Indications: pain Details: 22 G 1.5 in needle, ultrasound-guided anterior approach  Arthrogram: No  Medications: 4 mL lidocaine  1 %; 80 mg triamcinolone acetonide 40 MG/ML Outcome: tolerated well, no immediate complications Procedure, treatment alternatives, risks and benefits explained, specific risks discussed. Consent was given by the patient. Immediately prior to procedure a time out was called to verify the correct patient, procedure, equipment, support staff and site/side marked as required. Patient was prepped and draped in the usual sterile fashion.    Large Joint Inj: L glenohumeral on 12/29/2023 5:07 PM Indications: pain Details: 22 G 1.5 in needle, ultrasound-guided anterior approach  Arthrogram: No  Medications: 4 mL lidocaine  1 %; 80 mg triamcinolone acetonide 40 MG/ML Outcome: tolerated well, no immediate complications Procedure, treatment alternatives, risks and benefits explained, specific risks discussed. Consent was given by the patient. Immediately prior to procedure a time out was called to verify the correct patient, procedure, equipment, support staff and site/side marked as required. Patient was prepped and draped in the usual sterile fashion.        I personally saw and evaluated the patient, and participated  in the management and treatment plan.  Wilhelmenia Harada, MD Attending Physician, Orthopedic Surgery  This document was dictated using Dragon voice recognition software. A reasonable attempt at proof reading has been made to minimize errors.

## 2023-12-30 ENCOUNTER — Encounter (HOSPITAL_COMMUNITY)

## 2023-12-30 ENCOUNTER — Encounter (HOSPITAL_COMMUNITY): Payer: Self-pay

## 2023-12-30 NOTE — Therapy (Signed)
 [facility] Outpatient Rehabilitation at [facility] [address] [place], [place], [address] Phone: [phone]   Fax:  [phone]  Patient Details  Name: Jasmine Davies MRN: [id number] Date of Birth: 28-May-1966 Referring Provider:  No ref. provider found  Encounter Date: 12/30/2023  Pt was called concerning her missed 4:00 PM appointment this afternoon. Pts voicemail box was full and was not able to leave message. We hope to see [name] back at her next scheduled appointment time.  Armond Bertin, PT, DPT [facility] Office: [phone] 4:32 PM, 12/30/23   [facility] Outpatient Rehabilitation at [facility] [address] [place], [place], [address] Phone: [phone]   Fax:  [phone]

## 2023-12-31 ENCOUNTER — Telehealth (HOSPITAL_BASED_OUTPATIENT_CLINIC_OR_DEPARTMENT_OTHER): Payer: Self-pay | Admitting: Orthopaedic Surgery

## 2023-12-31 NOTE — ED Provider Notes (Signed)
 [place] EMERGENCY DEPARTMENT AT [facility] Provider Note   CSN: [id number] Arrival date & time: 12/28/23  4098     History  Chief Complaint  Patient presents with   Arm Pain    Jasmine Davies is a 58 y.o. female.  Patient   Arm Pain       Home Medications Prior to Admission medications   Medication Sig Start Date End Date Taking? Authorizing Provider  oxyCODONE -acetaminophen  (PERCOCET/ROXICET) 5-325 MG tablet Take 1 tablet by mouth every 6 (six) hours as needed for severe pain (pain score 7-10). 12/28/23  Yes Syd Manges, MD  albuterol  (PROVENTIL ) (2.5 MG/3ML) 0.083% nebulizer solution Use up to every 4 hours if neededUse up to every 4 hours if needed 08/26/23   Del Amber Bail, Rogerio Clay, FNP  albuterol  (VENTOLIN  HFA) 108 (90 Base) MCG/ACT inhaler Inhale 2 puffs into the lungs every 6 (six) hours as needed for wheezing or shortness of breath. 08/26/23   Del Abron Abt, FNP  amLODipine  (NORVASC ) 5 MG tablet Take 1 tablet (5 mg total) by mouth daily. 12/08/23   Del Orbe Polanco, Iliana, FNP  buPROPion  (WELLBUTRIN  XL) 150 MG 24 hr tablet Take 1 tablet (150 mg total) by mouth daily. 12/08/23   Del Abron Abt, FNP  busPIRone  (BUSPAR ) 15 MG tablet Take 1 tablet (15 mg total) by mouth 2 (two) times daily. 12/08/23   Del Orbe Polanco, Iliana, FNP  gabapentin  (NEURONTIN ) 300 MG capsule Take 1 capsule (300 mg total) by mouth 2 (two) times daily. 08/26/23   Del Abron Abt, FNP  mometasone-formoterol (DULERA ) 100-5 MCG/ACT AERO Inhale 2 puffs into the lungs 2 (two) times daily. 08/26/23   Del Abron Abt, FNP  tiZANidine  (ZANAFLEX ) 4 MG tablet Take 1 tablet (4 mg total) by mouth every 8 (eight) hours as needed for muscle spasms. 08/26/23   Del Abron Abt, FNP      Allergies    Penicillins    Review of Systems   Review of Systems  Physical Exam Updated Vital Signs BP (!) 155/84   Pulse 66   Temp 98 F (36.7 C) (Oral)   Resp  19   Ht 4\' 11"  (1.499 m)   Wt 55.3 kg   SpO2 91%   BMI 24.64 kg/m  Physical Exam  ED Results / Procedures / Treatments   Labs (all labs ordered are listed, but only abnormal results are displayed) Labs Reviewed - No data to display  EKG None  Radiology No results found.  Procedures Procedures    Medications Ordered in ED Medications  oxyCODONE -acetaminophen  (PERCOCET/ROXICET) 5-325 MG per tablet 1 tablet (1 tablet Oral Given 12/28/23 0743)  ketorolac  (TORADOL ) 30 MG/ML injection 30 mg (30 mg Intramuscular Given 12/28/23 1191)    ED Course/ Medical Decision Making/ A&P                                 Medical Decision Making Amount and/or Complexity of Data Reviewed Radiology: ordered.  Risk Prescription drug management.           Final Clinical Impression(s) / ED Diagnoses Final diagnoses:  Acute pain of right shoulder    Rx / DC Orders ED Discharge Orders          Ordered    oxyCODONE -acetaminophen  (PERCOCET/ROXICET) 5-325 MG tablet  Every 6 hours PRN        12/28/23 0804  Cheyenne Cotta, MD 12/31/23 1204

## 2023-12-31 NOTE — Telephone Encounter (Signed)
 Patient would like to talk about the injections she got on yesterday. Best contact [phone]

## 2024-01-04 ENCOUNTER — Encounter (HOSPITAL_COMMUNITY)

## 2024-01-06 ENCOUNTER — Encounter (HOSPITAL_COMMUNITY)

## 2024-01-11 ENCOUNTER — Encounter (HOSPITAL_COMMUNITY)

## 2024-01-13 ENCOUNTER — Encounter (HOSPITAL_COMMUNITY)

## 2024-01-16 ENCOUNTER — Ambulatory Visit (HOSPITAL_COMMUNITY): Admission: RE | Admit: 2024-01-16 | Source: Ambulatory Visit

## 2024-01-20 ENCOUNTER — Telehealth: Payer: Self-pay | Admitting: Acute Care

## 2024-01-20 NOTE — Telephone Encounter (Signed)
 Patient is scheduled for a nodule follow up scan on 01/21/2024 at [facility], this is incorrect as pt needs a 3 month follow up scan due mid July. Called and left pt a VM letting her know we need to cancel this appt tomorrow and reschedule her.

## 2024-01-21 ENCOUNTER — Ambulatory Visit (HOSPITAL_COMMUNITY): Admission: RE | Admit: 2024-01-21 | Source: Ambulatory Visit

## 2024-01-21 NOTE — Telephone Encounter (Signed)
 Called and spoke to pt. Patient aware the LDCT was scheduled too early by the scheduling dept. Patient has been rescheduled for 7/15 at [facility]. Pt verbalized understanding and denied any further questions or concerns at this time.

## 2024-01-26 ENCOUNTER — Ambulatory Visit (HOSPITAL_BASED_OUTPATIENT_CLINIC_OR_DEPARTMENT_OTHER): Admitting: Orthopaedic Surgery

## 2024-01-26 DIAGNOSIS — M25511 Pain in right shoulder: Secondary | ICD-10-CM

## 2024-01-26 DIAGNOSIS — G8929 Other chronic pain: Secondary | ICD-10-CM | POA: Diagnosis not present

## 2024-01-26 NOTE — Progress Notes (Signed)
 Chief Complaint: Bilateral shoulder pain     History of Present Illness:   01/26/2024: Presents today for bilateral shoulder pain.  She did get extremely good relief from her left shoulder injection.  She is having some limited relief in the right shoulder.  Jasmine Davies is a 58 y.o. female presents today with right worse than left shoulder pain.  She has been seeing Dr. Sulema Endo for history of left shoulder pain for which an MRI was obtained.  She is here today for further discussion particularly of the right shoulder as she did have a recent fall on this with quite limited overhead range of motion she was placed in a sling in the emergency room.  X-rays were negative.    PMH/PSH/Family History/Social History/Meds/Allergies:    Past Medical History:  Diagnosis Date   Chronic back pain    Chronic pain in left foot    Cleft foot L foot   Degenerative joint disease    Depression 2003   Hernia, hiatal    Hiatal hernia    Hypertension    PUD (peptic ulcer disease)    ? ETOH and ASA use   Sciatica    Stroke [facility])    Past Surgical History:  Procedure Laterality Date   CESAREAN SECTION     x2   CHOLECYSTECTOMY  1993   CLUB FOOT RELEASE     left foot   COLONOSCOPY WITH PROPOFOL  N/A 10/19/2023   Procedure: COLONOSCOPY WITH PROPOFOL ;  Surgeon: Hargis Lias, MD;  Location: [facility];  Service: Endoscopy;  Laterality: N/A;  12:45pm;asa 1-2   ESOPHAGOGASTRODUODENOSCOPY  10/06/2011   ulcers,multiple in antrum and duodenitis/hiatal hernia   ESOPHAGOGASTRODUODENOSCOPY  12/15/2011   SLF: Moderate gastritis/ Hiatal hernia/PUD IMPROVED BUT NOT RESOLVED DUE TO ONGOING ASA USE   TUBAL LIGATION  1999   Social History   Socioeconomic History   Marital status: Divorced    Spouse name: Not on file   Number of children: Not on file   Years of education: Not on file   Highest education level: Not on file  Occupational History   Not on file  Tobacco Use   Smoking status: Every Day     Current packs/day: 0.75    Average packs/day: 0.7 packs/day for 43.1 years (32.3 ttl pk-yrs)    Types: Cigarettes    Start date: 12/19/1980   Smokeless tobacco: Never   Tobacco comments:    A pack every 2-3 day, 08/06/2023, jb, cma  Vaping Use   Vaping status: Never Used  Substance and Sexual Activity   Alcohol use: Not Currently    Alcohol/week: 67.0 standard drinks of alcohol    Types: 67 Cans of beer per week    Comment: 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 08/09/23   Drug use: Not Currently    Types: Marijuana   Sexual activity: Not Currently    Birth control/protection: None, Surgical  Other Topics Concern   Not on file  Social History Narrative   Not on file   Social Drivers of Health   Financial Resource Strain: Not on file  Food Insecurity: Not on file  Transportation Needs: Not on file  Physical Activity: Not on file  Stress: Not on file  Social Connections: Not on file   Family History  Problem Relation Age of Onset   Hypertension Mother    Heart failure Father        CHF  Alcohol abuse Father    Cancer Father        Stomach Cancer   Colon cancer Neg Hx    Allergies  Allergen Reactions   Penicillins Rash   Current Outpatient Medications  Medication Sig Dispense Refill   albuterol  (PROVENTIL ) (2.5 MG/3ML) 0.083% nebulizer solution Use up to every 4 hours if neededUse up to every 4 hours if needed     albuterol  (VENTOLIN  HFA) 108 (90 Base) MCG/ACT inhaler Inhale 2 puffs into the lungs every 6 (six) hours as needed for wheezing or shortness of breath. 8 g 3   amLODipine  (NORVASC ) 5 MG tablet Take 1 tablet (5 mg total) by mouth daily. 90 tablet 1   buPROPion  (WELLBUTRIN  XL) 150 MG 24 hr tablet Take 1 tablet (150 mg total) by mouth daily. 30 tablet 2   busPIRone  (BUSPAR ) 15 MG tablet Take 1 tablet (15 mg total) by mouth 2 (two) times daily. 60 tablet 2   gabapentin  (NEURONTIN ) 300 MG capsule Take 1 capsule (300 mg total) by mouth 2 (two) times  daily. 60 capsule 3   mometasone-formoterol (DULERA ) 100-5 MCG/ACT AERO Inhale 2 puffs into the lungs 2 (two) times daily. 1 each 3   oxyCODONE -acetaminophen  (PERCOCET/ROXICET) 5-325 MG tablet Take 1 tablet by mouth every 6 (six) hours as needed for severe pain (pain score 7-10). 20 tablet 0   tiZANidine  (ZANAFLEX ) 4 MG tablet Take 1 tablet (4 mg total) by mouth every 8 (eight) hours as needed for muscle spasms. 30 tablet 2   No current facility-administered medications for this visit.   No results found.   Review of Systems:   A ROS was performed including pertinent positives and negatives as documented in the HPI.  Physical Exam :   Constitutional: NAD and appears stated age Neurological: Alert and oriented Psych: Appropriate affect and cooperative There were no vitals taken for this visit.   Comprehensive Musculoskeletal Exam:    Tenderness about the glenohumeral joint bilaterally.  Active forward elevation on the left is 100 degrees compared to 40 on the right with pain.  External rotation at the side is to 20 degrees and painful bilaterally.  Internal rotation deferred today positive Neer impingement   Imaging:   Xray (3 views left shoulder, 3 views right shoulder): Normal  MRI (left shoulder): Bursal sided fraying with obliteration of the inferior glenohumeral pouch   I personally reviewed and interpreted the radiographs.   Assessment and Plan:   58 y.o. female with evidence of left worse than right adhesive capsulitis which is now been ongoing for several months.  Given this I did recommend bilateral ultrasound-guided injection of the glenohumeral joint.  She did get extremely good relief from her left shoulder and is hoping to have an additional 1 in the right shoulder to get her over the finish line here.  Will plan for right shoulder glenohumeral injection today.  I would also like her to participate in physical therapy to work on the neck and range of motion of the  shoulders.  I will plan to see her back in 6 weeks  - Return to clinic 6 weeks for reassessment    I personally saw and evaluated the patient, and participated in the management and treatment plan.  Wilhelmenia Harada, MD Attending Physician, Orthopedic Surgery  This document was dictated using Dragon voice recognition software. A reasonable attempt at proof reading has been made to minimize errors.

## 2024-01-27 DIAGNOSIS — H5213 Myopia, bilateral: Secondary | ICD-10-CM | POA: Diagnosis not present

## 2024-01-28 ENCOUNTER — Telehealth (HOSPITAL_BASED_OUTPATIENT_CLINIC_OR_DEPARTMENT_OTHER): Payer: Self-pay | Admitting: Orthopaedic Surgery

## 2024-01-28 NOTE — Telephone Encounter (Signed)
 Patient states she has a bruise on her arm after her injection and wants to know if she can do anything about it or is it normal

## 2024-02-06 ENCOUNTER — Other Ambulatory Visit: Payer: Self-pay | Admitting: Family Medicine

## 2024-02-16 ENCOUNTER — Other Ambulatory Visit (HOSPITAL_BASED_OUTPATIENT_CLINIC_OR_DEPARTMENT_OTHER): Payer: Self-pay

## 2024-02-16 ENCOUNTER — Other Ambulatory Visit (HOSPITAL_BASED_OUTPATIENT_CLINIC_OR_DEPARTMENT_OTHER): Payer: Self-pay | Admitting: Orthopaedic Surgery

## 2024-02-16 ENCOUNTER — Telehealth (HOSPITAL_BASED_OUTPATIENT_CLINIC_OR_DEPARTMENT_OTHER): Payer: Self-pay | Admitting: Orthopaedic Surgery

## 2024-02-16 MED ORDER — MELOXICAM 15 MG PO TABS: 15.0000 mg | ORAL_TABLET | Freq: Every day | ORAL | 0 refills | Status: DC

## 2024-02-16 MED ORDER — MELOXICAM 15 MG PO TABS
15.0000 mg | ORAL_TABLET | Freq: Every day | ORAL | 0 refills | Status: DC
  Filled 2024-02-16: qty 30, 30d supply, fill #0

## 2024-02-16 NOTE — Telephone Encounter (Signed)
 Patient needs meds sent to [facility]   [address] [place], [place], [place] [address] [phone] (P) because they deliver and she does not have a way to go pick it from [facility]

## 2024-02-16 NOTE — Telephone Encounter (Signed)
 Patient wants to know if she can get something for pain for her neck and shoulder. Patient states that the injection did not help her. Best contact [phone]

## 2024-02-17 ENCOUNTER — Other Ambulatory Visit: Payer: Self-pay | Admitting: Acute Care

## 2024-02-17 DIAGNOSIS — R911 Solitary pulmonary nodule: Secondary | ICD-10-CM

## 2024-02-17 DIAGNOSIS — Z87891 Personal history of nicotine dependence: Secondary | ICD-10-CM

## 2024-02-22 ENCOUNTER — Ambulatory Visit (HOSPITAL_COMMUNITY): Admission: RE | Admit: 2024-02-22 | Source: Ambulatory Visit

## 2024-02-23 ENCOUNTER — Ambulatory Visit (HOSPITAL_BASED_OUTPATIENT_CLINIC_OR_DEPARTMENT_OTHER): Admitting: Orthopaedic Surgery

## 2024-02-25 ENCOUNTER — Other Ambulatory Visit (HOSPITAL_BASED_OUTPATIENT_CLINIC_OR_DEPARTMENT_OTHER): Payer: Self-pay

## 2024-03-11 DIAGNOSIS — S0003XA Contusion of scalp, initial encounter: Secondary | ICD-10-CM | POA: Diagnosis not present

## 2024-03-12 ENCOUNTER — Ambulatory Visit (HOSPITAL_COMMUNITY): Admission: RE | Admit: 2024-03-12 | Source: Ambulatory Visit

## 2024-03-29 ENCOUNTER — Other Ambulatory Visit: Payer: Self-pay | Admitting: Family Medicine

## 2024-03-29 DIAGNOSIS — F419 Anxiety disorder, unspecified: Secondary | ICD-10-CM

## 2024-03-31 ENCOUNTER — Encounter: Payer: Self-pay | Admitting: Radiology

## 2024-04-12 ENCOUNTER — Ambulatory Visit: Admitting: Family Medicine

## 2024-04-18 ENCOUNTER — Other Ambulatory Visit: Payer: Self-pay | Admitting: Family Medicine

## 2024-04-18 DIAGNOSIS — F32A Depression, unspecified: Secondary | ICD-10-CM

## 2024-04-18 NOTE — Telephone Encounter (Unsigned)
 Copied from CRM [id number]. Topic: Clinical - Medication Refill >> Apr 18, 2024  5:31 PM Kevelyn M wrote: Medication: escitalopram  (LEXAPRO ) 10 MG, buPROPion  (WELLBUTRIN  XL) 150 MG 24 hr tablet, busPIRone  (BUSPAR ) 15 MG tablet  Has the patient contacted their pharmacy? Yes (Agent: If no, request that the patient contact the pharmacy for the refill. If patient does not wish to contact the pharmacy document the reason why and proceed with request.) (Agent: If yes, when and what did the pharmacy advise?)  This is the patient's preferred pharmacy:  [facility] [place], [place] - [address] [place] [address] Phone: [phone] Fax: [phone]  Is this the correct pharmacy for this prescription? Yes If no, delete pharmacy and type the correct one.   Has the prescription been filled recently? No  Is the patient out of the medication? Yes  Has the patient been seen for an appointment in the last year OR does the patient have an upcoming appointment? Yes  Can we respond through MyChart? No  Agent: Please be advised that Rx refills may take up to 3 business days. We ask that you follow-up with your pharmacy.

## 2024-04-19 ENCOUNTER — Encounter: Payer: Self-pay | Admitting: Family Medicine

## 2024-05-14 ENCOUNTER — Other Ambulatory Visit: Payer: Self-pay | Admitting: Family Medicine

## 2024-05-27 ENCOUNTER — Other Ambulatory Visit: Payer: Self-pay | Admitting: Family Medicine

## 2024-06-01 ENCOUNTER — Other Ambulatory Visit: Payer: Self-pay

## 2024-06-01 ENCOUNTER — Emergency Department (HOSPITAL_COMMUNITY)
Admission: EM | Admit: 2024-06-01 | Discharge: 2024-06-01 | Disposition: A | Discharge status: Home / Self Care | Attending: Emergency Medicine | Admitting: Emergency Medicine

## 2024-06-01 ENCOUNTER — Emergency Department (HOSPITAL_COMMUNITY)

## 2024-06-01 ENCOUNTER — Ambulatory Visit: Payer: Self-pay

## 2024-06-01 DIAGNOSIS — R079 Chest pain, unspecified: Secondary | ICD-10-CM | POA: Diagnosis not present

## 2024-06-01 DIAGNOSIS — R072 Precordial pain: Secondary | ICD-10-CM | POA: Diagnosis present

## 2024-06-01 DIAGNOSIS — R202 Paresthesia of skin: Secondary | ICD-10-CM | POA: Diagnosis not present

## 2024-06-01 DIAGNOSIS — R0789 Other chest pain: Secondary | ICD-10-CM | POA: Diagnosis not present

## 2024-06-01 DIAGNOSIS — R42 Dizziness and giddiness: Secondary | ICD-10-CM | POA: Diagnosis not present

## 2024-06-01 DIAGNOSIS — R29818 Other symptoms and signs involving the nervous system: Secondary | ICD-10-CM | POA: Diagnosis not present

## 2024-06-01 DIAGNOSIS — R200 Anesthesia of skin: Secondary | ICD-10-CM | POA: Diagnosis not present

## 2024-06-01 DIAGNOSIS — R531 Weakness: Secondary | ICD-10-CM | POA: Diagnosis not present

## 2024-06-01 LAB — CBC WITH DIFFERENTIAL/PLATELET
Abs Immature Granulocytes: 0.04 K/uL (ref 0.00–0.07)
Basophils Absolute: 0 K/uL (ref 0.0–0.1)
Basophils Relative: 0 %
Eosinophils Absolute: 0.1 K/uL (ref 0.0–0.5)
Eosinophils Relative: 1 %
HCT: 40.9 % (ref 36.0–46.0)
Hemoglobin: 13.7 g/dL (ref 12.0–15.0)
Immature Granulocytes: 0 %
Lymphocytes Relative: 29 %
Lymphs Abs: 2.6 K/uL (ref 0.7–4.0)
MCH: 33.6 pg (ref 26.0–34.0)
MCHC: 33.5 g/dL (ref 30.0–36.0)
MCV: 100.2 fL — ABNORMAL HIGH (ref 80.0–100.0)
Monocytes Absolute: 1 K/uL (ref 0.1–1.0)
Monocytes Relative: 11 %
Neutro Abs: 5.2 K/uL (ref 1.7–7.7)
Neutrophils Relative %: 59 %
Platelets: 347 K/uL (ref 150–400)
RBC: 4.08 MIL/uL (ref 3.87–5.11)
RDW: 13.2 % (ref 11.5–15.5)
WBC: 9 K/uL (ref 4.0–10.5)
nRBC: 0 % (ref 0.0–0.2)

## 2024-06-01 LAB — TROPONIN T, HIGH SENSITIVITY
Troponin T High Sensitivity: <15 ng/L (ref 0–19)
Troponin T High Sensitivity: <15 ng/L (ref 0–19)

## 2024-06-01 LAB — BASIC METABOLIC PANEL WITH GFR
Anion gap: 9 (ref 5–15)
BUN: 10 mg/dL (ref 6–20)
CO2: 28 mmol/L (ref 22–32)
Calcium: 8.2 mg/dL — ABNORMAL LOW (ref 8.9–10.3)
Chloride: 100 mmol/L (ref 98–111)
Creatinine, Ser: 0.66 mg/dL (ref 0.44–1.00)
GFR, Estimated: >60 mL/min (ref >60)
Glucose, Bld: 75 mg/dL (ref 70–99)
Potassium: 3.7 mmol/L (ref 3.5–5.1)
Sodium: 138 mmol/L (ref 135–145)

## 2024-06-01 MED ORDER — ASPIRIN 81 MG PO CHEW
324.0000 mg | CHEWABLE_TABLET | Freq: Once | ORAL | Status: AC
  Administered 2024-06-01: 324 mg via ORAL
  Filled 2024-06-01: qty 4

## 2024-06-01 MED ORDER — OXYCODONE-ACETAMINOPHEN 5-325 MG PO TABS
1.0000 | ORAL_TABLET | Freq: Once | ORAL | Status: AC
  Administered 2024-06-01: 1 via ORAL
  Filled 2024-06-01: qty 1

## 2024-06-01 NOTE — Discharge Instructions (Signed)
 You were seen in the emergency department for some intermittent chest pain and some intermittent left-sided numbness.  You had blood work EKG chest x-ray and a CAT scan of your head that did not show an obvious explanation for your symptoms.  Please continue your regular medications and follow-up with your primary care doctor.  I am also putting a referral in for you to follow-up with neurology.  Return to the emergency department if any worsening or concerning symptoms

## 2024-06-01 NOTE — ED Provider Notes (Signed)
 [place] EMERGENCY DEPARTMENT AT [facility] Provider Note   CSN: [id number] Arrival date & time: 06/01/24  1522     Patient presents with: Chest Pain   Jasmine Davies is a 58 y.o. female.  She is presenting by ambulance with a complaint of chest pain 3 out of 10 stabbing central along with left-sided numbness that started 4 to 5 days ago.  Symptoms have not resolved so she called her primary care doctor who recommended she come to the emergency department for further evaluation.  She denies any prior history of heart disease.  No headache or blurry vision.  She does feel spacey.  No significant cough or shortness of breath.  She has not tried anything for her symptoms.  She said she has had the numbness before and she attributes it to a mini stroke  {Add pertinent medical, surgical, social history, OB history to YEP:67052} The history is provided by the patient.  Chest Pain Pain location:  Substernal area Pain quality: stabbing   Pain severity:  Mild Onset quality:  Gradual Duration:  5 days Timing:  Intermittent Progression:  Unchanged Chronicity:  New Relieved by:  None tried Associated symptoms: no abdominal pain, no cough, no diaphoresis, no dizziness, no fever, no nausea, no shortness of breath and no vomiting        Prior to Admission medications   Medication Sig Start Date End Date Taking? Authorizing Provider  albuterol  (PROVENTIL ) (2.5 MG/3ML) 0.083% nebulizer solution Use up to every 4 hours if neededUse up to every 4 hours if needed 08/26/23   Del Wilhelmena Falter, Hilario, FNP  albuterol  (VENTOLIN  HFA) 108 (90 Base) MCG/ACT inhaler Inhale 2 puffs into the lungs every 6 (six) hours as needed for wheezing or shortness of breath. 08/26/23   Del Wilhelmena Falter Hilario, FNP  amLODipine  (NORVASC ) 5 MG tablet Take 1 tablet (5 mg total) by mouth daily. 12/08/23   Del Orbe Polanco, Iliana, FNP  buPROPion  (WELLBUTRIN  XL) 150 MG 24 hr tablet Take 1 tablet (150 mg total) by  mouth daily. 03/29/24   Del Wilhelmena Falter Hilario, FNP  busPIRone  (BUSPAR ) 15 MG tablet Take 1 tablet (15 mg total) by mouth 2 (two) times daily. 03/29/24   Del Wilhelmena Falter Hilario, FNP  gabapentin  (NEURONTIN ) 300 MG capsule Take 1 capsule (300 mg total) by mouth 2 (two) times daily. 02/07/24   Del Orbe Polanco, Iliana, FNP  meloxicam  (MOBIC ) 15 MG tablet Take 1 tablet (15 mg total) by mouth daily. 02/16/24   Genelle Standing, MD  meloxicam  (MOBIC ) 15 MG tablet Take 1 tablet (15 mg total) by mouth daily. 02/16/24   Genelle Standing, MD  mometasone-formoterol (DULERA ) 100-5 MCG/ACT AERO Inhale 2 puffs into the lungs 2 (two) times daily. 08/26/23   Del Orbe Polanco, Iliana, FNP  oxyCODONE -acetaminophen  (PERCOCET/ROXICET) 5-325 MG tablet Take 1 tablet by mouth every 6 (six) hours as needed for severe pain (pain score 7-10). 12/28/23   Suzette Pac, MD  tiZANidine  (ZANAFLEX ) 4 MG tablet Take 1 tablet (4 mg total) by mouth every 8 (eight) hours as needed for muscle spasms. 05/29/24   Bacchus, Meade PEDLAR, FNP    Allergies: Penicillins    Review of Systems  Constitutional:  Negative for diaphoresis and fever.  Respiratory:  Negative for cough and shortness of breath.   Cardiovascular:  Positive for chest pain.  Gastrointestinal:  Negative for abdominal pain, nausea and vomiting.  Neurological:  Negative for dizziness.    Updated Vital Signs BP ROLLEN)  157/79 (BP Location: Left Arm)   Pulse 68   Temp 97.6 F (36.4 C) (Oral)   Resp 18   Ht 4' 11 (1.499 m)   Wt 55.3 kg   SpO2 99%   BMI 24.64 kg/m   Physical Exam Vitals and nursing note reviewed.  Constitutional:      General: She is not in acute distress.    Appearance: Normal appearance. She is well-developed.  HENT:     Head: Normocephalic and atraumatic.  Eyes:     Conjunctiva/sclera: Conjunctivae normal.  Cardiovascular:     Rate and Rhythm: Normal rate and regular rhythm.     Heart sounds: No murmur heard. Pulmonary:     Effort: Pulmonary  effort is normal. No respiratory distress.     Breath sounds: Normal breath sounds.  Abdominal:     Palpations: Abdomen is soft.     Tenderness: There is no abdominal tenderness. There is no guarding or rebound.  Musculoskeletal:     Cervical back: Neck supple.     Right lower leg: No tenderness. No edema.     Left lower leg: No tenderness. No edema.  Skin:    General: Skin is warm and dry.     Capillary Refill: Capillary refill takes less than 2 seconds.  Neurological:     General: No focal deficit present.     Mental Status: She is alert.     Cranial Nerves: No cranial nerve deficit.     Motor: No weakness.     (all labs ordered are listed, but only abnormal results are displayed) Labs Reviewed  BASIC METABOLIC PANEL WITH GFR  CBC WITH DIFFERENTIAL/PLATELET  TROPONIN T, HIGH SENSITIVITY    EKG: EKG Interpretation Date/Time:  Thursday June 01 2024 15:33:41 EDT Ventricular Rate:  64 PR Interval:  141 QRS Duration:  84 QT Interval:  445 QTC Calculation: 460 R Axis:   60  Text Interpretation: Sinus rhythm No significant change since prior 1/24 Confirmed by Towana Sharper [phone]) on 06/01/2024 3:34:56 PM  Radiology: No results found.  {Document cardiac monitor, telemetry assessment procedure when appropriate:32947} Procedures   Medications Ordered in the ED  aspirin  chewable tablet 324 mg (has no administration in time range)      {Click here for ABCD2, HEART and other calculators REFRESH Note before signing:1}                              Medical Decision Making Amount and/or Complexity of Data Reviewed Labs: ordered. Radiology: ordered.  Risk OTC drugs.   This patient complains of ***; this involves an extensive number of treatment Options and is a complaint that carries with it a high risk of complications and morbidity. The differential includes ***  I ordered, reviewed and interpreted labs, which included *** I ordered medication *** and  reviewed PMP when indicated. I ordered imaging studies which included *** and I independently    visualized and interpreted imaging which showed *** Additional history obtained from *** Previous records obtained and reviewed *** I consulted *** and discussed lab and imaging findings and discussed disposition.  Cardiac monitoring reviewed, *** Social determinants considered, *** Critical Interventions: ***  After the interventions stated above, I reevaluated the patient and found *** Admission and further testing considered, ***   {Document critical care time when appropriate  Document review of labs and clinical decision tools ie CHADS2VASC2, etc  Document your independent review of radiology  images and any outside records  Document your discussion with family members, caretakers and with consultants  Document social determinants of health affecting pt's care  Document your decision making why or why not admission, treatments were needed:32947:::1}   Final diagnoses:  None    ED Discharge Orders     None

## 2024-06-01 NOTE — Telephone Encounter (Signed)
 FYI Only or Action Required?: FYI only for provider.  Patient was last seen in primary care on 12/08/2023 by [name], FNP.  Called Nurse Triage reporting Neurologic Problem.  Symptoms began several days ago.  Interventions attempted: Nothing.  Symptoms are: unchanged.  Triage Disposition: Call EMS 911 Now  Patient/caregiver understands and will follow disposition?: No, refuses disposition  Pt refused 911. Pt states she has a friend there. Rn attempted to speak to friend to see if she could take her. Silence, call disconnected. RN called 911 to see if they could check on pt. They stated they will call back with an update.   Copied from CRM [id number]. Topic: Clinical - Red Word Triage >> Jun 01, 2024  2:13 PM Harlene ORN wrote: Red Word that prompted transfer to Nurse Triage: lightheadedness. Passed out Saturday; numbness on left side. Has not been to a hospital. Reason for Disposition  [1] SEVERE weakness (e.g., unable to walk or barely able to walk, requires support) AND [2] new-onset or getting worse  Answer Assessment - Initial Assessment Questions Patient states she had numbness and weakness on Saturday and passed out. She states she did not hit her head. She is currently still having the numbness and weakness on her left side. Rn offered to call 911, pt declined. She states her friend is there to take her. Rn spoke to friend to see if she could take her and then there was no reply. Rn attempted to call back and there was no answer. RN LVM for pt to return call.   RN called 911 for wellness check.   1. SYMPTOM: What is the main symptom you are concerned about? (e.g., weakness, numbness) Numbness and weakness on left side 2. ONSET: When did this start? (e.g., minutes, hours, days; while sleeping)     Saturday 3. LAST NORMAL: When was the last time you (the patient) were normal (no symptoms)? unknown 4. PATTERN Does this come and go, or has it been constant  since it started?  Is it present now?     Intermittent, present now  6. NEUROLOGIC SYMPTOMS: Have you had any of the following symptoms: headache, dizziness, vision loss, double vision, changes in speech, unsteady on your feet?     Pt did take some time to respond to answers.  Protocols used: Neurologic Deficit-A-AH

## 2024-06-01 NOTE — ED Notes (Signed)
 Upon cardiac assessment the pt endorses 5/10 pain that is constant stabbing knife pain that starts in the middle of her chest and goes to the right side of her chest. Pt endorses that she does not have a Hx of heart attacks. Pt is in the bed with call bed at side connected to heart monitor, BP and SpO2 monitoring devices.

## 2024-06-01 NOTE — ED Triage Notes (Signed)
 Pt presents to ED via [facility]. PCP called out to EMS for left sided weakness. Pt states had LOC on Sat and had numbness. Pt started having mid chest pain couple days ago. 500 ml NS given PTA

## 2024-06-01 NOTE — Telephone Encounter (Signed)
Patient advised  ED 

## 2024-06-12 ENCOUNTER — Encounter: Payer: Self-pay | Admitting: Radiology

## 2024-06-14 ENCOUNTER — Ambulatory Visit: Payer: Self-pay

## 2024-06-14 VITALS — BP 153/78 | HR 94 | Ht 59.0 in | Wt 122.0 lb

## 2024-06-14 DIAGNOSIS — M542 Cervicalgia: Secondary | ICD-10-CM

## 2024-06-14 DIAGNOSIS — Z23 Encounter for immunization: Secondary | ICD-10-CM | POA: Diagnosis not present

## 2024-06-14 MED ORDER — METHOCARBAMOL 750 MG PO TABS: 750.0000 mg | ORAL_TABLET | Freq: Three times a day (TID) | ORAL | 5 refills | Status: DC | PRN

## 2024-06-14 NOTE — Progress Notes (Signed)
 Established Patient Office Visit  Subjective   Patient ID: Jasmine Davies, female    DOB: 12-07-1965  Age: 58 y.o. MRN: [id number]  Chief Complaint  Patient presents with   Medical Management of Chronic Issues    Pt here for hospital follow up     HPI  This patient complains of intermittent left-sided stabbing chest pain, intermittent left-sided numbness; this involves an extensive number of treatment Options and is a complaint that carries with it a high risk of complications and morbidity. The differential includes ACS, vascular, stroke, musculoskeletal, reflux    Patient Active Problem List   Diagnosis Date Noted   Diverticulosis of colon without hemorrhage 10/19/2023   Neck pain 08/06/2023   Anxiety 08/06/2023   Encounter for Papanicolaou smear of cervix 12/11/2022   COPD ? 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   Hypertensive crisis 07/03/2019   TIA (transient ischemic attack) 07/02/2019   Pain in left foot 12/23/2015   Cigarette smoker 12/23/2015   Primary hypertension 12/23/2015   History of alcohol abuse 12/23/2015   Abdominal pain, other specified site 09/20/2012   Nausea with vomiting 09/20/2012   Abdominal bloating 12/09/2011   PUD (peptic ulcer disease) 12/09/2011   Epigastric pain 10/01/2011   Hematemesis 10/01/2011   Abnormal computerized axial tomography of chest 10/01/2011   Abnormal CT scan 10/01/2011      ROS    Objective:     BP (!) 153/78   Pulse 94   Ht 4' 11 (1.499 m)   Wt 122 lb (55.3 kg)   SpO2 (!) 87%   BMI 24.64 kg/m  BP Readings from Last 3 Encounters:  06/16/24 (!) 153/78  06/14/24 (!) 153/78  06/01/24 (!) 155/88   Wt Readings from Last 3 Encounters:  06/16/24 122 lb (55.3 kg)  06/14/24 122 lb (55.3 kg)  06/01/24 122 lb (55.3 kg)     Physical Exam Vitals and nursing note reviewed.  Constitutional:      Appearance: Normal appearance.  HENT:     Head:  Normocephalic.  Eyes:     Extraocular Movements: Extraocular movements intact.     Pupils: Pupils are equal, round, and reactive to light.  Neck:     Thyroid : No thyroid  mass.  Cardiovascular:     Rate and Rhythm: Normal rate and regular rhythm.  Pulmonary:     Effort: Pulmonary effort is normal.     Breath sounds: Normal breath sounds.  Musculoskeletal:     Cervical back: No rigidity. Pain with movement and muscular tenderness present. Decreased range of motion.  Lymphadenopathy:     Cervical: No cervical adenopathy.  Neurological:     Mental Status: She is alert and oriented to person, place, and time.  Psychiatric:        Mood and Affect: Mood normal.        Thought Content: Thought content normal.      No results found for any visits on 06/14/24.    The ASCVD Risk score (Arnett DK, et al., 2019) failed to calculate for the following reasons:   Risk score cannot be calculated because patient has a medical history suggesting prior/existing ASCVD    Assessment & Plan:   Problem List Items Addressed This Visit       Other   Neck pain - Primary   Cervical radiculopathy with neck and arm pain Chronic neck and arm pain due to cervical  radiculopathy, likely from a C6 bone spur. Severe pain affects daily activities and arm mobility. Gabapentin  and tizanidine  used for pain; tizanidine  causes drowsiness. Tramadol  ineffective. MRI pending post-physical therapy. Surgery possible if nicotine  cessation achieved. - Prescribed Robaxone for daytime muscle spasms. - Encouraged continuation of home exercises, focusing on gentle stretching. - Recommended online resources for neck exercises, such as 'yoga by H ran'. - Advised contacting Dr. Jeraline office for MRI and further management. - Discussed potential neck surgery if conservative measures fail, emphasizing nicotine  cessation.      Relevant Medications   methocarbamol  (ROBAXIN ) 750 MG tablet   Other Visit Diagnoses        Encounter for immunization       Relevant Orders   Flu vaccine trivalent PF, 6mos and older(Flulaval,Afluria,Fluarix,Fluzone) (Completed)        No follow-ups on file.    Leita Longs, FNP

## 2024-06-16 ENCOUNTER — Emergency Department (HOSPITAL_COMMUNITY): Admission: EM | Admit: 2024-06-16 | Discharge: 2024-06-16 | Disposition: A | Discharge status: Home / Self Care

## 2024-06-16 ENCOUNTER — Other Ambulatory Visit: Payer: Self-pay

## 2024-06-16 ENCOUNTER — Encounter (HOSPITAL_COMMUNITY): Payer: Self-pay

## 2024-06-16 ENCOUNTER — Emergency Department (HOSPITAL_COMMUNITY)

## 2024-06-16 DIAGNOSIS — D72829 Elevated white blood cell count, unspecified: Secondary | ICD-10-CM | POA: Insufficient documentation

## 2024-06-16 DIAGNOSIS — R519 Headache, unspecified: Secondary | ICD-10-CM | POA: Diagnosis not present

## 2024-06-16 DIAGNOSIS — M79603 Pain in arm, unspecified: Secondary | ICD-10-CM | POA: Diagnosis not present

## 2024-06-16 DIAGNOSIS — Z79899 Other long term (current) drug therapy: Secondary | ICD-10-CM | POA: Diagnosis not present

## 2024-06-16 DIAGNOSIS — M25511 Pain in right shoulder: Secondary | ICD-10-CM | POA: Insufficient documentation

## 2024-06-16 DIAGNOSIS — R609 Edema, unspecified: Secondary | ICD-10-CM | POA: Diagnosis not present

## 2024-06-16 DIAGNOSIS — G8929 Other chronic pain: Secondary | ICD-10-CM | POA: Diagnosis not present

## 2024-06-16 DIAGNOSIS — I10 Essential (primary) hypertension: Secondary | ICD-10-CM | POA: Diagnosis not present

## 2024-06-16 DIAGNOSIS — M19011 Primary osteoarthritis, right shoulder: Secondary | ICD-10-CM | POA: Diagnosis not present

## 2024-06-16 LAB — COMPREHENSIVE METABOLIC PANEL WITH GFR
ALT: 10 U/L (ref 0–44)
AST: 15 U/L (ref 15–41)
Albumin: 4.1 g/dL (ref 3.5–5.0)
Alkaline Phosphatase: 89 U/L (ref 38–126)
Anion gap: 10 (ref 5–15)
BUN: 9 mg/dL (ref 6–20)
CO2: 28 mmol/L (ref 22–32)
Calcium: 8.8 mg/dL — ABNORMAL LOW (ref 8.9–10.3)
Chloride: 101 mmol/L (ref 98–111)
Creatinine, Ser: 0.65 mg/dL (ref 0.44–1.00)
GFR, Estimated: >60 mL/min (ref >60)
Glucose, Bld: 124 mg/dL — ABNORMAL HIGH (ref 70–99)
Potassium: 3.9 mmol/L (ref 3.5–5.1)
Sodium: 139 mmol/L (ref 135–145)
Total Bilirubin: 0.4 mg/dL (ref 0.0–1.2)
Total Protein: 6.9 g/dL (ref 6.5–8.1)

## 2024-06-16 LAB — CBC
HCT: 39.9 % (ref 36.0–46.0)
Hemoglobin: 13.4 g/dL (ref 12.0–15.0)
MCH: 33.3 pg (ref 26.0–34.0)
MCHC: 33.6 g/dL (ref 30.0–36.0)
MCV: 99.3 fL (ref 80.0–100.0)
Platelets: 275 K/uL (ref 150–400)
RBC: 4.02 MIL/uL (ref 3.87–5.11)
RDW: 13.2 % (ref 11.5–15.5)
WBC: 12.7 K/uL — ABNORMAL HIGH (ref 4.0–10.5)
nRBC: 0 % (ref 0.0–0.2)

## 2024-06-16 MED ORDER — KETOROLAC TROMETHAMINE 15 MG/ML IJ SOLN
15.0000 mg | Freq: Once | INTRAMUSCULAR | Status: AC
  Administered 2024-06-16: 15 mg via INTRAMUSCULAR
  Filled 2024-06-16: qty 1

## 2024-06-16 MED ORDER — OXYCODONE-ACETAMINOPHEN 5-325 MG PO TABS
1.0000 | ORAL_TABLET | Freq: Once | ORAL | Status: AC
  Administered 2024-06-16: 1 via ORAL
  Filled 2024-06-16: qty 1

## 2024-06-16 MED ORDER — OXYCODONE-ACETAMINOPHEN 5-325 MG PO TABS: 1.0000 | ORAL_TABLET | Freq: Four times a day (QID) | ORAL | 0 refills | Status: AC | PRN

## 2024-06-16 NOTE — ED Provider Notes (Signed)
 [place] EMERGENCY DEPARTMENT AT [facility] Provider Note   CSN: [id number] Arrival date & time: 06/16/24  9170     Patient presents with: Arm Pain (Right )   Jasmine Davies is a 58 y.o. female.  58 year old female presents ED via EMS with complaints of right shoulder stiffness and pain.  Patient advises started yesterday morning has gradually gotten worse.  Patient reported taking 10 muscle relaxers yesterday, she took 5 in the morning waited 3 hours and took 5 more.  Patient advised to did not help at all and she is still in significant pain.  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  Patient also endorses a headache starting this morning due to difficulty sleeping last night.  Patient reports being on medication for depression and hypertension.  Patient denies any thyroid  history or diabetes.  Patient denies any other associated symptoms.     Prior to Admission medications   Medication Sig Start Date End Date Taking? Authorizing Provider  oxyCODONE -acetaminophen  (PERCOCET/ROXICET) 5-325 MG tablet Take 1 tablet by mouth every 6 (six) hours as needed for severe pain (pain score 7-10). 06/16/24  Yes Myriam Fonda RAMAN, PA-C  albuterol  (PROVENTIL ) (2.5 MG/3ML) 0.083% nebulizer solution Use up to every 4 hours if neededUse up to every 4 hours if needed Patient taking differently: Take 2.5 mg by nebulization every 4 (four) hours as needed for wheezing or shortness of breath. Use up to every 4 hours if neededUse up to every 4 hours if needed 08/26/23   Del Wilhelmena Falter, Hilario, FNP  albuterol  (VENTOLIN  HFA) 108 (90 Base) MCG/ACT inhaler Inhale 2 puffs into the lungs every 6 (six) hours as needed for wheezing or shortness of breath. 08/26/23   Del Wilhelmena Falter Hilario, FNP  amLODipine  (NORVASC ) 5 MG tablet Take 1 tablet (5 mg total) by mouth daily. 12/08/23   Del Wilhelmena Falter Hilario, FNP   Aspirin -Acetaminophen  (GOODYS BODY PAIN PO) Take 1 Package by mouth 3 (three) times daily as needed (headaches).    [provider]  buPROPion  (WELLBUTRIN  XL) 150 MG 24 hr tablet Take 1 tablet (150 mg total) by mouth daily. 03/29/24   Del Orbe Polanco, Iliana, FNP  busPIRone  (BUSPAR ) 15 MG tablet Take 1 tablet (15 mg total) by mouth 2 (two) times daily. 03/29/24   Del Orbe Polanco, Iliana, FNP  escitalopram  (LEXAPRO ) 10 MG tablet Take 10 mg by mouth daily. 05/27/24   [provider]  gabapentin  (NEURONTIN ) 300 MG capsule Take 1 capsule (300 mg total) by mouth 2 (two) times daily. 02/07/24   Del Orbe Polanco, Iliana, FNP  methocarbamol  (ROBAXIN ) 750 MG tablet Take 1 tablet (750 mg total) by mouth every 8 (eight) hours as needed for muscle spasms. 06/14/24   Bevely Doffing, FNP  mometasone-formoterol (DULERA ) 100-5 MCG/ACT AERO Inhale 2 puffs into the lungs 2 (two) times daily. 08/26/23   Del Orbe Polanco, Iliana, FNP  tiZANidine  (ZANAFLEX ) 4 MG tablet Take 1 tablet (4 mg total) by mouth every 8 (eight) hours as needed for muscle spasms. 05/29/24   Bacchus, Meade PEDLAR, FNP    Allergies: Penicillins    Review of Systems  Musculoskeletal:  Positive for arthralgias.  All other systems reviewed and are negative.   Updated Vital Signs BP (!) 162/84   Pulse 82   Temp 98.7 F (37.1 C) (Oral)   Resp 15   Ht 4' 11 (1.499 m)   Wt  55.3 kg   SpO2 96%   BMI 24.64 kg/m   Physical Exam Vitals and nursing note reviewed.  Constitutional:      Appearance: Normal appearance.  HENT:     Head: Normocephalic and atraumatic.     Nose: Nose normal.  Eyes:     Extraocular Movements: Extraocular movements intact.     Conjunctiva/sclera: Conjunctivae normal.     Pupils: Pupils are equal, round, and reactive to light.  Neck:     Comments: No pain to palpation through C-spine and patient has full range of motion of the neck. Cardiovascular:     Rate and Rhythm: Normal rate.  Pulmonary:      Effort: Pulmonary effort is normal. No respiratory distress.     Breath sounds: Normal breath sounds.  Abdominal:     General: Abdomen is flat.     Tenderness: There is no abdominal tenderness. There is no guarding.  Musculoskeletal:        General: Tenderness present. No swelling or deformity.     Cervical back: Normal range of motion. No rigidity.     Comments: Impaired range of motion of the right shoulder.  Patient reports significant pain on passive and active range of motion.  Mild pain to palpation throughout the right shoulder.  No obvious swelling, erythema, warmth to the area.  Full range of motion of elbow and hand and appropriate grip strength.  Good sensation cap refill and strong pulses in the distal extremity.  Skin:    General: Skin is warm.     Capillary Refill: Capillary refill takes less than 2 seconds.  Neurological:     General: No focal deficit present.     Mental Status: She is alert.  Psychiatric:        Mood and Affect: Mood normal.        Behavior: Behavior normal.     (all labs ordered are listed, but only abnormal results are displayed) Labs Reviewed  CBC - Abnormal; Notable for the following components:      Result Value   WBC 12.7 (*)    All other components within normal limits  COMPREHENSIVE METABOLIC PANEL WITH GFR - Abnormal; Notable for the following components:   Glucose, Bld 124 (*)    Calcium 8.8 (*)    All other components within normal limits    EKG: None  Radiology: DG Shoulder Right Result Date: 06/16/2024 EXAM: 1 VIEW XRAY OF THE RIGHT SHOULDER 06/16/2024 09:36:00 AM COMPARISON: Right shoulder radiographs dated 12/28/2023. CLINICAL HISTORY: Atraumatic pain. FINDINGS: BONES AND JOINTS: Glenohumeral joint is normally aligned. No acute fracture or dislocation. Stable minimal right glenohumeral joint osteoarthritis. Stable mild right acromioclavicular joint osteoarthritis. Small right subacromial spur appears increased. SOFT TISSUES:  Small focus of soft tissue calcification in the right superior glenoid labrum region. Visualized lung is unremarkable. IMPRESSION: 1. Small right subacromial spur appears increased. 2. Small focus of soft tissue calcification in the right superior glenoid labrum region, favor dystrophic. 3. Stable mild right acromioclavicular joint osteoarthritis. 4. Stable minimal right glenohumeral joint osteoarthritis. Electronically signed by: Selinda Blue MD 06/16/2024 10:02 AM EST RP Workstation: [id number]     Procedures   Medications Ordered in the ED  oxyCODONE -acetaminophen  (PERCOCET/ROXICET) 5-325 MG per tablet 1 tablet (1 tablet Oral Given 06/16/24 0934)  ketorolac  (TORADOL ) 15 MG/ML injection 15 mg (15 mg Intramuscular Given 06/16/24 0934)    58 y.o. female presents to the ED with complaints of right shoulder pain and stiffness and headache,  this involves an extensive number of treatment options, and is a complaint that carries with it a high risk of complications and morbidity.  The differential diagnosis includes adhesive capsulitis, shoulder dislocation, septic joint, rotator cuff injury, muscular strain or other tendon injury.  (Ddx)  On arrival pt is nontoxic, vitals unremarkable. Exam significant for pain with range of motion of right shoulder.  No obvious signs of erythema or swelling.  Additional history obtained from chart review significant for patient being seen in June by Dr. Genelle for adhesive capsulitis for which she was given an injection in the shoulder with relief.  Patient was advised to return to clinic in 6-week for further assessment.  Patient has an appointment on Monday for follow-up.   I ordered medication Toradol  and oxycodone  for pain of the right shoulder and associated headache.  Lab Tests:  I Ordered, reviewed, and interpreted labs, which included: CMP CBC.  Mildly elevated white count no other concerning findings on lab work.  Patient does not report any fevers and  shoulder is not erythematous or warm to touch.    Imaging Studies ordered:  I ordered imaging studies which included shoulder x-ray, I independently visualized and interpreted imaging which showed small subacromial spur, small soft tissue calcification in the glenoid labrum region, and osteoarthritis.  ED Course:   Prior to arrival patient was placed in sling by EMS.  Patient was removed from sling and arm was moved at bedside.  Passive and active range of motion elected pain of the shoulder.  With patient's history of adhesive capsulitis it's likely that this is the same.  Patient was recently seen by Ortho for the same complaint given a steroid injection and advised to follow-up in 6 weeks.  On reevaluation patient is playing solitaire on her phone with her right hand with no obvious signs of pain.  Patient reports her appointment is on Monday for follow-up of right shoulder pain.  Patient was advised of findings of right shoulder x-ray and advised to attempt to move it is much as possible at home and was given a short course of opioids for pain management and advised to use as last resort with ibuprofen  first.  Patient was advised to follow recommendations of orthopedist on Monday.  Patient was agreeable to treatment plan and was given strict return precautions.  Patient was comfortable discharge at this time.   Portions of this note were generated with Scientist, clinical (histocompatibility and immunogenetics). Dictation errors may occur despite best attempts at proofreading.   Final diagnoses:  Chronic right shoulder pain    ED Discharge Orders          Ordered    oxyCODONE -acetaminophen  (PERCOCET/ROXICET) 5-325 MG tablet  Every 6 hours PRN        06/16/24 1054               Mitra Duling S, PA-C 06/16/24 1104    Kammerer, Megan L, DO 06/18/24 [phone]

## 2024-06-16 NOTE — ED Triage Notes (Signed)
 Patient BIB [facility] from home for complaint of righgt arm pain and swelling for past 2 days. Pain radiates to back and neck. Denise's chest pain. Took 10 muscle relaxer's without relief. Pain 10/10. Patient states HX of HTN and depression and Frozen shoulders. EMS applied sling.

## 2024-06-16 NOTE — Discharge Instructions (Signed)
 I believe the shoulder pain is associated with your frozen shoulder diagnosis.  I have given you a short course of pain medication.  Please take ibuprofen  first as needed for pain and if that does not alleviate the pain take the pain medication I have prescribed to help.  Please follow-up with your established appointment on Monday with orthopedics for further management of the shoulder pain.  It is advised to not put the shoulder in a sling and let it hang by your side and try to move it is much as tolerable.  If you notice any significant swelling and redness to the area please return to the ED for further evaluation.  Other reasons to return the ED would be if you experience shortness of breath chest pain, or complete loss of function or strength of the extremity.

## 2024-06-19 ENCOUNTER — Other Ambulatory Visit (HOSPITAL_BASED_OUTPATIENT_CLINIC_OR_DEPARTMENT_OTHER): Payer: Self-pay

## 2024-06-19 ENCOUNTER — Ambulatory Visit (INDEPENDENT_AMBULATORY_CARE_PROVIDER_SITE_OTHER): Admitting: Student

## 2024-06-19 DIAGNOSIS — G8929 Other chronic pain: Secondary | ICD-10-CM

## 2024-06-19 DIAGNOSIS — M25511 Pain in right shoulder: Secondary | ICD-10-CM

## 2024-06-19 DIAGNOSIS — M5412 Radiculopathy, cervical region: Secondary | ICD-10-CM

## 2024-06-19 MED ORDER — METHYLPREDNISOLONE 4 MG PO TBPK
ORAL_TABLET | ORAL | 0 refills | Status: AC
  Filled 2024-06-19: qty 21, 6d supply, fill #0

## 2024-06-19 NOTE — Assessment & Plan Note (Signed)
 Cervical radiculopathy with neck and arm pain Chronic neck and arm pain due to cervical radiculopathy, likely from a C6 bone spur. Severe pain affects daily activities and arm mobility. Gabapentin  and tizanidine  used for pain; tizanidine  causes drowsiness. Tramadol  ineffective. MRI pending post-physical therapy. Surgery possible if nicotine  cessation achieved. - Prescribed Robaxone for daytime muscle spasms. - Encouraged continuation of home exercises, focusing on gentle stretching. - Recommended online resources for neck exercises, such as 'yoga by H ran'. - Advised contacting Dr. Jeraline office for MRI and further management. - Discussed potential neck surgery if conservative measures fail, emphasizing nicotine  cessation.

## 2024-06-19 NOTE — Progress Notes (Signed)
 Chief Complaint: Neck and right shoulder pain     History of Present Illness:    Jasmine Davies is a 58 y.o. female who presents today for evaluation of pain in her neck and right shoulder.  She does have history of adhesive capsulitis in both shoulders and has received injections which was historically more effective in the left shoulder.  Patient reports that about 4 days ago she began having severe pain in the right shoulder without any known injury.  She is unable to lift her arm overhead and is having some pain around the shoulder blade.  Pain does occasionally radiate down into the forearm.  Has undergone an MRI of the left shoulder but no advanced imaging within the last year of her right shoulder or cervical spine.  Has been taking muscle relaxers and oxycodone  given by the ED with only mild relief.   Surgical History:   None  PMH/PSH/Family History/Social History/Meds/Allergies:    Past Medical History:  Diagnosis Date   Chronic back pain    Chronic pain in left foot    Cleft foot L foot   Degenerative joint disease    Depression 2003   Hernia, hiatal    Hiatal hernia    Hypertension    PUD (peptic ulcer disease)    ? ETOH and ASA use   Sciatica    Stroke [facility])    Past Surgical History:  Procedure Laterality Date   CESAREAN SECTION     x2   CHOLECYSTECTOMY  1993   CLUB FOOT RELEASE     left foot   COLONOSCOPY WITH PROPOFOL  N/A 10/19/2023   Procedure: COLONOSCOPY WITH PROPOFOL ;  Surgeon: Cinderella Deatrice FALCON, MD;  Location: [facility];  Service: Endoscopy;  Laterality: N/A;  12:45pm;asa 1-2   ESOPHAGOGASTRODUODENOSCOPY  10/06/2011   ulcers,multiple in antrum and duodenitis/hiatal hernia   ESOPHAGOGASTRODUODENOSCOPY  12/15/2011   SLF: Moderate gastritis/ Hiatal hernia/PUD IMPROVED BUT NOT RESOLVED DUE TO ONGOING ASA USE   TUBAL LIGATION  1999   Social History   Socioeconomic History   Marital status: Divorced    Spouse name: Not  on file   Number of children: Not on file   Years of education: Not on file   Highest education level: Not on file  Occupational History   Not on file  Tobacco Use   Smoking status: Every Day    Current packs/day: 0.75    Average packs/day: 0.8 packs/day for 43.5 years (32.6 ttl pk-yrs)    Types: Cigarettes    Start date: 12/19/1980   Smokeless tobacco: Never   Tobacco comments:    A pack every 2-3 day, 08/06/2023, jb, cma  Vaping Use   Vaping status: Never Used  Substance and Sexual Activity   Alcohol use: Not Currently    Alcohol/week: 67.0 standard drinks of alcohol    Types: 67 Cans of beer per week    Comment: 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 08/09/23   Drug use: Not Currently    Types: Marijuana   Sexual activity: Not Currently    Birth control/protection: None, Surgical  Other Topics Concern   Not on file  Social History Narrative   Not on file   Social Drivers of Health   Financial Resource Strain: Not on  file  Food Insecurity: Not on file  Transportation Needs: Not on file  Physical Activity: Not on file  Stress: Not on file  Social Connections: Not on file   Family History  Problem Relation Age of Onset   Hypertension Mother    Heart failure Father        CHF   Alcohol abuse Father    Cancer Father        Stomach Cancer   Colon cancer Neg Hx    Allergies  Allergen Reactions   Penicillins Rash   Current Outpatient Medications  Medication Sig Dispense Refill   methylPREDNISolone  (MEDROL  DOSEPAK) 4 MG TBPK tablet Take per packet instructions 21 each 0   albuterol  (PROVENTIL ) (2.5 MG/3ML) 0.083% nebulizer solution Use up to every 4 hours if neededUse up to every 4 hours if needed (Patient taking differently: Take 2.5 mg by nebulization every 4 (four) hours as needed for wheezing or shortness of breath. Use up to every 4 hours if neededUse up to every 4 hours if needed)     albuterol  (VENTOLIN  HFA) 108 (90 Base) MCG/ACT inhaler Inhale 2 puffs  into the lungs every 6 (six) hours as needed for wheezing or shortness of breath. 8 g 3   amLODipine  (NORVASC ) 5 MG tablet Take 1 tablet (5 mg total) by mouth daily. 90 tablet 1   Aspirin -Acetaminophen  (GOODYS BODY PAIN PO) Take 1 Package by mouth 3 (three) times daily as needed (headaches).     buPROPion  (WELLBUTRIN  XL) 150 MG 24 hr tablet Take 1 tablet (150 mg total) by mouth daily. 30 tablet 2   busPIRone  (BUSPAR ) 15 MG tablet Take 1 tablet (15 mg total) by mouth 2 (two) times daily. 60 tablet 2   escitalopram  (LEXAPRO ) 10 MG tablet Take 10 mg by mouth daily.     gabapentin  (NEURONTIN ) 300 MG capsule Take 1 capsule (300 mg total) by mouth 2 (two) times daily. 60 capsule 3   methocarbamol  (ROBAXIN ) 750 MG tablet Take 1 tablet (750 mg total) by mouth every 8 (eight) hours as needed for muscle spasms. 60 tablet 5   mometasone-formoterol (DULERA ) 100-5 MCG/ACT AERO Inhale 2 puffs into the lungs 2 (two) times daily. 1 each 3   oxyCODONE -acetaminophen  (PERCOCET/ROXICET) 5-325 MG tablet Take 1 tablet by mouth every 6 (six) hours as needed for severe pain (pain score 7-10). 10 tablet 0   tiZANidine  (ZANAFLEX ) 4 MG tablet Take 1 tablet (4 mg total) by mouth every 8 (eight) hours as needed for muscle spasms. 30 tablet 2   No current facility-administered medications for this visit.   No results found.  Review of Systems:   A ROS was performed including pertinent positives and negatives as documented in the HPI.  Physical Exam :   Constitutional: NAD and appears stated age Neurological: Alert and oriented Psych: Appropriate affect and cooperative There were no vitals taken for this visit.   Comprehensive Musculoskeletal Exam:    Physical examination demonstrates tenderness along the right cervical paraspinal muscles and right scapular region.  Cervical ROM is slightly limited with bilateral rotation.  Grip strength 5/5 bilaterally.  Tenderness in the right shoulder over the anterior glenohumeral  joint and lateral deltoid.  Passive forward elevation to 140 degrees with active range of motion significantly limited due to discomfort.  Imaging:   Xray review from 06/16/2024 (right shoulder 3 views): Mild glenohumeral and AC joint osteoarthritis.  Subacromial osteophyte.   I personally reviewed and interpreted the radiographs.   Assessment:  58 y.o. female with complaint of right shoulder pain that occasionally radiates up toward the neck, right scapula, and down the right arm.  She does have history of bilateral adhesive capsulitis although right shoulder did not get the same amount of relief with cortisone injection of the left shoulder.  Based on her exam and presentation today, I am suspicious that current episode is more cervical in nature given the distribution and radiation of pain.  No red flag symptoms are present.  Last x-rays of the cervical spine in January 2025 did show anterior osteophytes at C5-C6 and disc narrowing with osteophyte formation at C6-C7.  Discussed that we could perform a cortisone injection today, however she may get limited relief of symptoms are radiating from the neck.  Will begin treatment today with a Medrol  Dosepak and would like to have patient follow-up in another week or two if symptoms are still persisting for reevaluation.  Could consider glenohumeral or subacromial injection, physical therapy, or further imaging of the cervical spine at that time.  Plan :    - Begin Medrol  Dosepak and follow-up within 1 to 2 weeks if symptoms persist     I personally saw and evaluated the patient, and participated in the management and treatment plan.  Leonce Reveal, PA-C Orthopedics

## 2024-06-27 ENCOUNTER — Telehealth: Payer: Self-pay | Admitting: Orthopaedic Surgery

## 2024-06-27 NOTE — Telephone Encounter (Signed)
 Tried calling pt to schedule. No answer and no voice mail

## 2024-06-27 NOTE — Telephone Encounter (Signed)
 Pt called saying that her arm isn't getting any better and that she has taken all the steroids that you gave her.  She also says that her arm is swollen and that it had a rash on it last night, but it is gone today. Call back number is [phone]

## 2024-06-28 ENCOUNTER — Telehealth (HOSPITAL_BASED_OUTPATIENT_CLINIC_OR_DEPARTMENT_OTHER): Payer: Self-pay | Admitting: Student

## 2024-06-28 NOTE — Telephone Encounter (Signed)
 Called patient and left a message for her to call the office to get an appointment sch

## 2024-07-03 ENCOUNTER — Telehealth: Payer: Self-pay | Admitting: Student

## 2024-07-03 NOTE — Telephone Encounter (Signed)
 Pt called stating she finished the steroids meds PA Overturf gave her and it didn't help pains and she is asking for pain medication. Please send to [facility]. Pt phone number is [phone].

## 2024-07-06 ENCOUNTER — Other Ambulatory Visit: Payer: Self-pay

## 2024-07-06 ENCOUNTER — Encounter (HOSPITAL_COMMUNITY): Payer: Self-pay

## 2024-07-06 ENCOUNTER — Emergency Department (HOSPITAL_COMMUNITY)
Admission: EM | Admit: 2024-07-06 | Discharge: 2024-07-06 | Disposition: A | Discharge status: Home / Self Care | Attending: Emergency Medicine | Admitting: Emergency Medicine

## 2024-07-06 ENCOUNTER — Emergency Department (HOSPITAL_COMMUNITY)

## 2024-07-06 DIAGNOSIS — M25511 Pain in right shoulder: Secondary | ICD-10-CM | POA: Insufficient documentation

## 2024-07-06 DIAGNOSIS — M25519 Pain in unspecified shoulder: Secondary | ICD-10-CM | POA: Diagnosis not present

## 2024-07-06 DIAGNOSIS — M19011 Primary osteoarthritis, right shoulder: Secondary | ICD-10-CM | POA: Diagnosis not present

## 2024-07-06 DIAGNOSIS — G8929 Other chronic pain: Secondary | ICD-10-CM | POA: Insufficient documentation

## 2024-07-06 DIAGNOSIS — I959 Hypotension, unspecified: Secondary | ICD-10-CM | POA: Diagnosis not present

## 2024-07-06 DIAGNOSIS — Z7982 Long term (current) use of aspirin: Secondary | ICD-10-CM | POA: Diagnosis not present

## 2024-07-06 DIAGNOSIS — R079 Chest pain, unspecified: Secondary | ICD-10-CM | POA: Diagnosis not present

## 2024-07-06 DIAGNOSIS — S43001A Unspecified subluxation of right shoulder joint, initial encounter: Secondary | ICD-10-CM | POA: Diagnosis not present

## 2024-07-06 MED ORDER — DEXAMETHASONE SOD PHOSPHATE PF 10 MG/ML IJ SOLN
10.0000 mg | Freq: Once | INTRAMUSCULAR | Status: AC
  Administered 2024-07-06: 10 mg via INTRAMUSCULAR

## 2024-07-06 NOTE — Discharge Instructions (Signed)
 You have had pain in your shoulder going on for many months, this is not a new problem, you will need to see your orthopedist for ongoing care.  I have given you a shot of steroid which should help, I have also given you a sling, you will need to see your family doctor or orthopedist for ongoing care.

## 2024-07-06 NOTE — ED Triage Notes (Signed)
 Pt BIB [facility] for right sided shoulder pain. Pain has been going on for 2 weeks. Her orthopedic told her she has frozen shoulder. Pain 10/10

## 2024-07-06 NOTE — ED Provider Notes (Signed)
 [place] EMERGENCY DEPARTMENT AT [facility] Provider Note   CSN: [id number] Arrival date & time: 07/06/24  1611     Patient presents with: Shoulder Pain   Jasmine Davies is a 58 y.o. female.    Shoulder Pain    This patient is a 58 year old female with a complaint of pain in the right shoulder.  She reports she has been told by her orthopedist that she has a frozen shoulder, the patient has been seen for her shoulder problems multiple times, followed in [place] and has been diagnosed with cervical radiculopathy as well as a frozen shoulder.  Was seen November 7 in the ER for shoulder pain, history of alcohol intoxication and homelessness in the past as well, she had been seen for chronic right shoulder pain as far long ago as June 18.  The patient reports to me that she still has shoulder pain she is tired of the shoulder pain and states I am not leaving until you fix me.  She denies fevers or recurrent injuries and has no numbness or weakness of the hand  Prior to Admission medications   Medication Sig Start Date End Date Taking? Authorizing Provider  albuterol  (PROVENTIL ) (2.5 MG/3ML) 0.083% nebulizer solution Use up to every 4 hours if neededUse up to every 4 hours if needed Patient taking differently: Take 2.5 mg by nebulization every 4 (four) hours as needed for wheezing or shortness of breath. Use up to every 4 hours if neededUse up to every 4 hours if needed 08/26/23   Del Wilhelmena Falter, Hilario, FNP  albuterol  (VENTOLIN  HFA) 108 (90 Base) MCG/ACT inhaler Inhale 2 puffs into the lungs every 6 (six) hours as needed for wheezing or shortness of breath. 08/26/23   Del Orbe Polanco, Iliana, FNP  amLODipine  (NORVASC ) 5 MG tablet Take 1 tablet (5 mg total) by mouth daily. 12/08/23   Del Wilhelmena Falter Hilario, FNP  Aspirin -Acetaminophen  (GOODYS BODY PAIN PO) Take 1 Package by mouth 3 (three) times daily as needed (headaches).    [provider]  buPROPion  (WELLBUTRIN   XL) 150 MG 24 hr tablet Take 1 tablet (150 mg total) by mouth daily. 03/29/24   Del Orbe Polanco, Iliana, FNP  busPIRone  (BUSPAR ) 15 MG tablet Take 1 tablet (15 mg total) by mouth 2 (two) times daily. 03/29/24   Del Orbe Polanco, Iliana, FNP  escitalopram  (LEXAPRO ) 10 MG tablet Take 10 mg by mouth daily. 05/27/24   [provider]  gabapentin  (NEURONTIN ) 300 MG capsule Take 1 capsule (300 mg total) by mouth 2 (two) times daily. 02/07/24   Del Orbe Polanco, Iliana, FNP  methocarbamol  (ROBAXIN ) 750 MG tablet Take 1 tablet (750 mg total) by mouth every 8 (eight) hours as needed for muscle spasms. 06/14/24   Bevely Doffing, FNP  methylPREDNISolone  (MEDROL  DOSEPAK) 4 MG TBPK tablet Take per packet instructions 06/19/24   Overturf, Jackson L, PA-C  mometasone-formoterol (DULERA ) 100-5 MCG/ACT AERO Inhale 2 puffs into the lungs 2 (two) times daily. 08/26/23   Del Orbe Polanco, Iliana, FNP  oxyCODONE -acetaminophen  (PERCOCET/ROXICET) 5-325 MG tablet Take 1 tablet by mouth every 6 (six) hours as needed for severe pain (pain score 7-10). 06/16/24   Myriam Fonda RAMAN, PA-C  tiZANidine  (ZANAFLEX ) 4 MG tablet Take 1 tablet (4 mg total) by mouth every 8 (eight) hours as needed for muscle spasms. 05/29/24   Bacchus, Gloria Z, FNP    Allergies: Penicillins    Review of Systems  All other systems reviewed and are  negative.   Updated Vital Signs BP 123/89   Pulse 84   Temp 98 F (36.7 C) (Oral)   Resp (!) 29   Wt 55 kg   SpO2 99%   BMI 24.49 kg/m   Physical Exam Vitals and nursing note reviewed.  Constitutional:      General: She is not in acute distress.    Appearance: She is well-developed.  HENT:     Head: Normocephalic and atraumatic.     Mouth/Throat:     Pharynx: No oropharyngeal exudate.  Eyes:     General: No scleral icterus.       Right eye: No discharge.        Left eye: No discharge.     Conjunctiva/sclera: Conjunctivae normal.     Pupils: Pupils are equal, round, and reactive to  light.  Neck:     Thyroid : No thyromegaly.     Vascular: No JVD.  Cardiovascular:     Rate and Rhythm: Normal rate and regular rhythm.     Heart sounds: Normal heart sounds. No murmur heard.    No friction rub. No gallop.  Pulmonary:     Effort: Pulmonary effort is normal. No respiratory distress.     Breath sounds: Normal breath sounds. No wheezing or rales.  Abdominal:     General: Bowel sounds are normal. There is no distension.     Palpations: Abdomen is soft. There is no mass.     Tenderness: There is no abdominal tenderness.  Musculoskeletal:        General: Tenderness present. No swelling or deformity.     Cervical back: Normal range of motion and neck supple.     Right lower leg: No edema.     Left lower leg: No edema.     Comments: Refuses to move the right shoulder  Lymphadenopathy:     Cervical: No cervical adenopathy.  Skin:    General: Skin is warm and dry.     Findings: No erythema or rash.  Neurological:     Mental Status: She is alert.     Coordination: Coordination normal.     Comments: Normal strength and sensation to the right upper extremity at the hand, normal pulses at the wrist, there is tenderness to palpation around the right shoulder but no redness or obvious swelling or warmth no rash  Psychiatric:        Behavior: Behavior normal.     (all labs ordered are listed, but only abnormal results are displayed) Labs Reviewed - No data to display  EKG: None  Radiology: DG Shoulder Right Result Date: 07/06/2024 CLINICAL DATA:  Two-week history of right shoulder pain EXAM: RIGHT SHOULDER - 3 VIEW COMPARISON:  Right shoulder radiograph dated 06/16/2024 FINDINGS: There is no evidence of fracture or dislocation. Mild superior subluxation of the humeral head relative to the glenoid. Mild degenerative changes of the acromioclavicular joint. Multiple old right posterior rib fractures. Soft tissues are unremarkable. IMPRESSION: 1. No acute fracture or  dislocation. 2. Mild superior subluxation of the humeral head relative to the glenoid, which can be seen in the setting of rotator cuff pathology. Electronically Signed   By: Limin  Xu M.D.   On: 07/06/2024 16:36     Procedures   Medications Ordered in the ED  dexamethasone  (DECADRON ) injection 10 mg (has no administration in time range)  Medical Decision Making Amount and/or Complexity of Data Reviewed Radiology: ordered.   Vital signs unremarkable, no fever, no signs of septic joint, history of old with this for at least 5 months based on the medical record, has been given sling for comfort, will give shot of Decadron  but not a diabetic     Final diagnoses:  Chronic right shoulder pain    ED Discharge Orders     None          Cleotilde Rogue, MD 07/06/24 1648

## 2024-07-15 DIAGNOSIS — M1811 Unilateral primary osteoarthritis of first carpometacarpal joint, right hand: Secondary | ICD-10-CM | POA: Diagnosis not present

## 2024-07-15 DIAGNOSIS — M25531 Pain in right wrist: Secondary | ICD-10-CM | POA: Diagnosis not present

## 2024-07-15 DIAGNOSIS — M7989 Other specified soft tissue disorders: Secondary | ICD-10-CM | POA: Diagnosis not present

## 2024-07-18 ENCOUNTER — Telehealth: Payer: Self-pay

## 2024-07-18 NOTE — Telephone Encounter (Signed)
 Copied from CRM #[id number]. Topic: Appointments - Scheduling Inquiry for Clinic >> Jul 17, 2024  4:08 PM Joesph B wrote: Reason for CRM: pt discharged from hospital 07/15/24, she needs a follow up appt within a week. Can someone call to schedule? Did not see any avail until feb -

## 2024-07-18 NOTE — Telephone Encounter (Signed)
 Called patient lvm to call office to schedule er follow up

## 2024-07-19 NOTE — Telephone Encounter (Signed)
 Scheduled video with meade

## 2024-07-21 ENCOUNTER — Telehealth: Admitting: Family Medicine

## 2024-07-21 DIAGNOSIS — G8929 Other chronic pain: Secondary | ICD-10-CM

## 2024-07-21 DIAGNOSIS — M25531 Pain in right wrist: Secondary | ICD-10-CM | POA: Diagnosis not present

## 2024-07-21 MED ORDER — NAPROXEN 500 MG PO TABS: 500.0000 mg | ORAL_TABLET | Freq: Every day | ORAL | 0 refills | Status: AC

## 2024-07-21 NOTE — Progress Notes (Signed)
 Virtual Visit via Video Note  I connected with Jasmine Davies on 07/21/2024 at 10:40 AM EST by a video enabled telemedicine application and verified that I am speaking with the correct person using two identifiers.  Patient Location: Home Provider Location: Home Office  I discussed the limitations, risks, security, and privacy concerns of performing an evaluation and management service by video and the availability of in person appointments. I also discussed with the patient that there may be a patient responsible charge related to this service. The patient expressed understanding and agreed to proceed.  Subjective: PCP: Jasmine Wilhelmena Lloyd Hilario, FNP  Chief Complaint  Patient presents with   Follow-up    ER follow up   HPI  The patient is seen today for an ER follow-up for right wrist pain. She reports feeling better and has been compliant with naproxen  500 mg daily and the Medrol  Dosepak. She notes decreased swelling and pain. Range of motion is intact.    ROS: Per HPI Current Medications[1]  Observations/Objective: There were no vitals filed for this visit. Physical Exam Patient is well-developed, well-nourished in no acute distress.  Resting comfortably at home.  Head is normocephalic, atraumatic.  No labored breathing.  Speech is clear and coherent with logical content.  Patient is alert and oriented at baseline.   Assessment and Plan: Chronic pain of right wrist -     Naproxen ; Take 1 tablet (500 mg total) by mouth daily.  Dispense: 90 tablet; Refill: 0   Encouraged to continue treatment regimen as is Refilled naproxen  Encouraged to follow up as needed for worsening symptoms Follow Up Instructions: No follow-ups on file.   I discussed the assessment and treatment plan with the patient. The patient was provided an opportunity to ask questions, and all were answered. The patient agreed with the plan and demonstrated an understanding of the instructions.   The  patient was advised to call back or seek an in-person evaluation if the symptoms worsen or if the condition fails to improve as anticipated.  The above assessment and management plan was discussed with the patient. The patient verbalized understanding of and has agreed to the management plan.   Jasmine JENEANE Gerlach, FNP     [1]  Current Outpatient Medications:    naproxen  (NAPROSYN ) 500 MG tablet, Take 1 tablet (500 mg total) by mouth daily., Disp: 90 tablet, Rfl: 0   albuterol  (PROVENTIL ) (2.5 MG/3ML) 0.083% nebulizer solution, Use up to every 4 hours if neededUse up to every 4 hours if needed (Patient taking differently: Take 2.5 mg by nebulization every 4 (four) hours as needed for wheezing or shortness of breath. Use up to every 4 hours if neededUse up to every 4 hours if needed), Disp: , Rfl:    albuterol  (VENTOLIN  HFA) 108 (90 Base) MCG/ACT inhaler, Inhale 2 puffs into the lungs every 6 (six) hours as needed for wheezing or shortness of breath., Disp: 8 g, Rfl: 3   amLODipine  (NORVASC ) 5 MG tablet, Take 1 tablet (5 mg total) by mouth daily., Disp: 90 tablet, Rfl: 1   Aspirin -Acetaminophen  (GOODYS BODY PAIN PO), Take 1 Package by mouth 3 (three) times daily as needed (headaches)., Disp: , Rfl:    buPROPion  (WELLBUTRIN  XL) 150 MG 24 hr tablet, Take 1 tablet (150 mg total) by mouth daily., Disp: 30 tablet, Rfl: 2   busPIRone  (BUSPAR ) 15 MG tablet, Take 1 tablet (15 mg total) by mouth 2 (two) times daily., Disp: 60 tablet, Rfl: 2  escitalopram  (LEXAPRO ) 10 MG tablet, Take 10 mg by mouth daily., Disp: , Rfl:    gabapentin  (NEURONTIN ) 300 MG capsule, Take 1 capsule (300 mg total) by mouth 2 (two) times daily., Disp: 60 capsule, Rfl: 3   methocarbamol  (ROBAXIN ) 750 MG tablet, Take 1 tablet (750 mg total) by mouth every 8 (eight) hours as needed for muscle spasms., Disp: 60 tablet, Rfl: 5   methylPREDNISolone  (MEDROL  DOSEPAK) 4 MG TBPK tablet, Take per packet instructions, Disp: 21 each, Rfl: 0    mometasone-formoterol (DULERA ) 100-5 MCG/ACT AERO, Inhale 2 puffs into the lungs 2 (two) times daily., Disp: 1 each, Rfl: 3   oxyCODONE -acetaminophen  (PERCOCET/ROXICET) 5-325 MG tablet, Take 1 tablet by mouth every 6 (six) hours as needed for severe pain (pain score 7-10)., Disp: 10 tablet, Rfl: 0   tiZANidine  (ZANAFLEX ) 4 MG tablet, Take 1 tablet (4 mg total) by mouth every 8 (eight) hours as needed for muscle spasms., Disp: 30 tablet, Rfl: 2

## 2024-07-31 ENCOUNTER — Ambulatory Visit: Payer: Self-pay

## 2024-07-31 NOTE — Telephone Encounter (Signed)
 FYI Only or Action Required?: Action required by provider: clinical question for provider and update on patient condition.  Patient was last seen in primary care on 07/21/2024 by Edman Meade PEDLAR, FNP.  Called Nurse Triage reporting Arm Pain.  Symptoms began several months ago.  Interventions attempted: Prescription medications: naproxen .  Symptoms are: unchanged.  Triage Disposition: See PCP When Office is Open (Within 3 Days)  Patient/caregiver understands and will follow disposition?: No, wishes to speak with PCP    Copied from CRM #[id number]. Topic: Clinical - Red Word Triage >> Jul 31, 2024 10:29 AM Joesph NOVAK wrote: Red Word that prompted transfer to Nurse Triage: Gout flare up. In severe pain, swelling, can't move. Right shoulder and her elbow. She wants to be admitted to the hospital.  >> Jul 31, 2024 10:42 AM Joesph B wrote: Patient hung up while holding for NT. Called pt and she did not answer  >> Jul 31, 2024 10:53 AM Vivian Z wrote: Patient is returning call. Transferred to NT.     Reason for Disposition  [1] MODERATE pain (e.g., interferes with normal activities) AND [2] present > 3 days  Answer Assessment - Initial Assessment Questions Pt called in requesting to be admitted to hospital for gout workup. Pt states she needs higher level of care than in office and ED won't admit her. Pt states she went to ED 11/27 and was d/c so she went to [facility] ED. There pt was dx with gout per a xray of R wrist and prescribed naproxen  to take daily; pt reports minimal relief. Discussed if pt had any lab work and if she potentially knew if they drew any uric acid levels; pt denies lab work. Discussed potential need for additional lab work. Pt denied any use of allopurinol. PT did report she is able to use her hand/ arm, just painful; 6/10 R side only. Discussed pt did not want an appt in office but that she was welcome to go back to [facility] ED if she felt they would admit her.  Discussed I would send information to PCP to update her on pt condition. She voiced appreciation. Please advise.      1. ONSET: When did the pain start?     Recurrent; went to ED 11/27 for wrist pain but was discharged w/o answers. Stated she went to [facility] ED and was given dx of gout   2. LOCATION: Where is the pain located?     R shoulder, R elbow; R wrist   3. PAIN: How bad is the pain? (Scale 0-10; or none, mild, moderate, severe)     6/10  4. WORK OR EXERCISE: Has there been any recent work or exercise that involved this part of the body?     None   5. CAUSE: What do you think is causing the arm pain?     Pt suspects gout flare up d/t recent dx   6. OTHER SYMPTOMS: Do you have any other symptoms? (e.g., neck pain, swelling, rash, fever, numbness, weakness)     None; reports arm feels warm but denies any swelling, numbness  Protocols used: Arm Pain-A-AH

## 2024-08-02 ENCOUNTER — Ambulatory Visit: Payer: Self-pay

## 2024-08-02 NOTE — Telephone Encounter (Signed)
 FYI Only or Action Required?: FYI only for provider: appointment scheduled on 08/09/24.  Patient was last seen in primary care on 07/21/2024 by Edman Meade PEDLAR, FNP.  Called Nurse Triage reporting Gout.  Symptoms began several weeks ago.  Interventions attempted: Prescription medications: Naproxen .  Symptoms are: stable.  Triage Disposition: See PCP Within 2 Weeks  Patient/caregiver understands and will follow disposition?: Yes   Copied from CRM #[id number]. Topic: Clinical - Red Word Triage >> Jul 31, 2024 10:29 AM Joesph NOVAK wrote: Red Word that prompted transfer to Nurse Triage: Gout flare up. In severe pain, swelling, can't move. Right shoulder and her elbow. She wants to be admitted to the hospital. >> Aug 02, 2024 11:32 AM Emylou G wrote: [facility] that prompted transfer to Nurse Triage: Gout flare up. In severe pain, swelling, can't move. Right shoulder and her elbow. She wants to be admitted to the hospital.  >> Jul 31, 2024 10:53 AM Mercer PEDLAR wrote: Patient is returning call. Transferred to NT.  >> Jul 31, 2024 10:42 AM Joesph B wrote: Patient hung up while holding for NT. Called pt and she did not answer Reason for Disposition  [1] MILD pain (e.g., does not interfere with normal activities) AND [2] present > 7 days  Answer Assessment - Initial Assessment Questions Patient taking  Naproxen ; Take 1 tablet (500 mg total), mild relief. Patient previously reports trying to be admitted to hospital to get further work up done, she is concerned about her new dx of gout and the affect on her body. Appointment scheduled 12/31 in office.   1. ONSET: When did the pain start?     Dx with gout at ED on 07/15/24  2. LOCATION: Where is the pain located?     Right arm and wrist  3. PAIN: How bad is the pain? (Scale 0-10; or none, mild, moderate, severe)     5/10  4. CAUSE: What do you think is causing the arm pain?     Gout  5. OTHER SYMPTOMS: Do you have any other symptoms?  (e.g., neck pain, swelling, rash, fever, numbness, weakness)     Pain in back between shoulder blades, comes and goes for a couple days.  Protocols used: Arm Pain-A-AH

## 2024-08-04 NOTE — Telephone Encounter (Signed)
Noted patient scheduled

## 2024-08-09 ENCOUNTER — Encounter: Payer: Self-pay | Admitting: Nurse Practitioner

## 2024-08-09 ENCOUNTER — Ambulatory Visit (INDEPENDENT_AMBULATORY_CARE_PROVIDER_SITE_OTHER): Payer: Self-pay | Admitting: Nurse Practitioner

## 2024-08-09 VITALS — BP 136/70 | HR 80 | Ht 59.0 in | Wt 118.0 lb

## 2024-08-09 DIAGNOSIS — J449 Chronic obstructive pulmonary disease, unspecified: Secondary | ICD-10-CM

## 2024-08-09 DIAGNOSIS — N898 Other specified noninflammatory disorders of vagina: Secondary | ICD-10-CM | POA: Diagnosis not present

## 2024-08-09 DIAGNOSIS — I10 Essential (primary) hypertension: Secondary | ICD-10-CM | POA: Diagnosis not present

## 2024-08-09 DIAGNOSIS — M25511 Pain in right shoulder: Secondary | ICD-10-CM | POA: Diagnosis not present

## 2024-08-09 NOTE — Progress Notes (Signed)
 "  Subjective:    Patient ID: Jasmine Davies, female    DOB: November 25, 1965, 58 y.o.   MRN: [id number]   Chief Complaint: Arm Pain (Right arm pain radiates under arm pit along under breast , hurts to the point of shortness of breath and wants to pass out ) and Vaginal Discharge (Clear discharge )   Arm Pain  Pertinent negatives include no chest pain.  Vaginal Discharge The patient's primary symptoms include vaginal discharge. Pertinent negatives include no abdominal pain, headaches or rash.   Patient in with 3 complaints: - right upper arm pain- started a couple months ago. Went to [facility] and [facility] with same complaints. Xrays negative. Prescribed naproxyn and pain meds. They dx her with gout. - vaginal discharge. Clear- has been going on for a couple of months. No burning or itching. No odor. - Has hx of COPD and has been experiencing increasing SOB. Is on dulera  daily. Has not needed albuterol .  Patient Active Problem List   Diagnosis Date Noted   Diverticulosis of colon without hemorrhage 10/19/2023   Neck pain 08/06/2023   Anxiety 08/06/2023   Encounter for Papanicolaou smear of cervix 12/11/2022   COPD ? 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  Eyes:  Negative for pain.  Respiratory:  Negative for shortness of breath.   Cardiovascular:   Negative for chest pain, palpitations and leg swelling.  Gastrointestinal:  Negative for abdominal pain.  Endocrine: Negative for polydipsia.  Genitourinary:  Positive for vaginal discharge.  Skin:  Negative for rash.  Neurological:  Negative for dizziness, weakness and headaches.  Hematological:  Does not bruise/bleed easily.  All other systems reviewed and are negative.      Objective:   Physical Exam Constitutional:      Appearance: Normal appearance.  Cardiovascular:     Rate and Rhythm: Normal rate and regular rhythm.     Heart sounds: Normal heart sounds.  Pulmonary:     Breath sounds: Wheezing (faint exp wheezes throughout) present.  Musculoskeletal:     Comments: Decreased ROM of right shoulder with pain on abduction at 45 degrees and internal rotation. Grips equal bil  Neurological:     General: No focal deficit present.     Mental Status: She is alert and oriented to person, place, and time.  Psychiatric:        Mood and Affect: Mood normal.        Behavior: Behavior normal.    BP 136/70   Pulse 80   Ht 4' 11 (1.499 m)   Wt 118 lb (53.5 kg)   SpO2 98%   BMI 23.83 kg/m         Assessment & Plan:  Jasmine Davies comes in today with chief complaint of Arm Pain (Right arm pain radiates under arm pit along under breast ,  hurts to the point of shortness of breath and wants to pass out ) and Vaginal Discharge (Clear discharge )   Diagnosis and orders addressed:  1. COPD mixed type (HCC) (Primary) Continue dulera  Albuterol  as needed Smoking cessation encouraged - Ambulatory referral to Pulmonology  2. Acute pain of right shoulder Ice Rest Ref to ortho - Ambulatory referral to Orthopedic Surgery  3. Vaginal discharge Self wet prep results pending - Cervicovaginal ancillary only - HIV antibody (with reflex)  4. Primary hypertension Dash diet - CBC with Differential/Platelet - BMP8+EGFR   Labs pending Health Maintenance reviewed Diet and  exercise encouraged  Follow up plan: prn   Jasmine Gladis, FNP   "

## 2024-08-09 NOTE — Patient Instructions (Signed)
 Shoulder Pain  Many things can cause shoulder pain, including:  An injury.  Moving the shoulder in the same way again and again (overuse).  Joint pain (arthritis).  Pain can come from:  Swelling and irritation (inflammation) of any part of the shoulder.  An injury to:  The shoulder joint.  Tissues that connect muscle to bone (tendons).  Tissues that connect bones to each other (ligaments).  Bones.  Follow these instructions at home:  Watch for changes in your symptoms. Let your doctor know about them. Follow these instructions to help with your pain.  If you have a sling that can be taken off:  Wear the sling as told by your doctor. Take it off only as told by your doctor.  Check the skin around the sling every day. Tell your doctor if you see problems.  Loosen the sling if your fingers:  Tingle.  Become numb.  Become cold.  Keep the sling clean.  If the sling is not waterproof:  Do not let it get wet.  Take the sling off when you shower or bathe.  Managing pain, stiffness, and swelling    If told, put ice on the painful area.  Put ice in a plastic bag.  Place a towel between your skin and the bag.  Leave the ice on for 20 minutes, 2-3 times a day. Stop putting ice on if it does not help with the pain.  If your skin turns bright red, take off the ice right away to prevent skin damage. The risk of damage is higher if you cannot feel pain, heat, or cold.  Squeeze a soft ball or a foam pad as much as possible. This prevents swelling in the shoulder. It also helps to strengthen the arm.  General instructions  Take over-the-counter and prescription medicines only as told by your doctor.  Keep all follow-up visits. This will help you avoid any type of permanent shoulder problems.  Contact a doctor if:  Your pain gets worse.  Medicine does not help your pain.  You have new pain in your arm, hand, or fingers.  You loosen your sling and your arm, hand, or fingers:  Tingle.  Are numb.  Are swollen.  Get help right away  if:  Your arm, hand, or fingers turn white or blue.  This information is not intended to replace advice given to you by your health care provider. Make sure you discuss any questions you have with your health care provider.  Document Revised: 02/27/2022 Document Reviewed: 02/27/2022  Elsevier Patient Education  2024 [employer].

## 2024-08-09 NOTE — Addendum Note (Signed)
 Addended by: DYANE MOATS D on: 08/09/2024 11:06 AM   Modules accepted: Orders

## 2024-08-10 LAB — ARTHRITIS PANEL
Basophils Absolute: 0 x10E3/uL (ref 0.0–0.2)
Basos: 1 %
EOS (ABSOLUTE): 0.2 x10E3/uL (ref 0.0–0.4)
Eos: 2 %
Hematocrit: 40.3 % (ref 34.0–46.6)
Hemoglobin: 13.5 g/dL (ref 11.1–15.9)
Immature Grans (Abs): 0 x10E3/uL (ref 0.0–0.1)
Immature Granulocytes: 0 %
Lymphocytes Absolute: 2.3 x10E3/uL (ref 0.7–3.1)
Lymphs: 29 %
MCH: 31.9 pg (ref 26.6–33.0)
MCHC: 33.5 g/dL (ref 31.5–35.7)
MCV: 95 fL (ref 79–97)
Monocytes Absolute: 0.7 x10E3/uL (ref 0.1–0.9)
Monocytes: 9 %
Neutrophils Absolute: 4.8 x10E3/uL (ref 1.4–7.0)
Neutrophils: 59 %
Platelets: 414 x10E3/uL (ref 150–450)
RBC: 4.23 x10E6/uL (ref 3.77–5.28)
RDW: 12.4 % (ref 11.7–15.4)
Rheumatoid fact SerPl-aCnc: <10 [IU]/mL
Sed Rate: 22 mm/h (ref 0–40)
Uric Acid: 5.4 mg/dL (ref 3.0–7.2)
WBC: 8.1 x10E3/uL (ref 3.4–10.8)

## 2024-08-10 LAB — BMP8+EGFR
BUN/Creatinine Ratio: 14 (ref 9–23)
BUN: 12 mg/dL (ref 6–24)
CO2: 23 mmol/L (ref 20–29)
Calcium: 8.9 mg/dL (ref 8.7–10.2)
Chloride: 103 mmol/L (ref 96–106)
Creatinine, Ser: 0.86 mg/dL (ref 0.57–1.00)
Glucose: 88 mg/dL (ref 70–99)
Potassium: 4 mmol/L (ref 3.5–5.2)
Sodium: 140 mmol/L (ref 134–144)
eGFR: 79 mL/min/1.73

## 2024-08-10 LAB — HIV ANTIBODY (ROUTINE TESTING W REFLEX): HIV Screen 4th Generation wRfx: NONREACTIVE

## 2024-08-11 ENCOUNTER — Ambulatory Visit: Payer: Self-pay | Admitting: Nurse Practitioner

## 2024-08-13 LAB — NUSWAB VAGINITIS (VG)
Candida albicans, NAA: NEGATIVE
Candida glabrata, NAA: NEGATIVE
Trich vag by NAA: POSITIVE — AB

## 2024-08-14 MED ORDER — METRONIDAZOLE 500 MG PO TABS: 500.0000 mg | ORAL_TABLET | Freq: Two times a day (BID) | ORAL | 0 refills | Status: AC

## 2024-08-22 ENCOUNTER — Encounter: Payer: Self-pay | Admitting: General Practice

## 2024-08-24 ENCOUNTER — Ambulatory Visit: Admitting: Orthopedic Surgery

## 2024-08-24 ENCOUNTER — Other Ambulatory Visit: Payer: Self-pay | Admitting: Family Medicine

## 2024-08-24 MED ORDER — TIZANIDINE HCL 4 MG PO TABS: 4.0000 mg | ORAL_TABLET | Freq: Three times a day (TID) | ORAL | 2 refills | Status: AC | PRN

## 2024-08-24 NOTE — Telephone Encounter (Signed)
 Copied from CRM [id number]. Topic: Clinical - Medication Refill >> Aug 24, 2024 11:07 AM Drema MATSU wrote: Medication: tiZANidine  (ZANAFLEX ) 4 MG tablet Pt wants 90 day supply  Has the patient contacted their pharmacy? no (Agent: If no, request that the patient contact the pharmacy for the refill. If patient does not wish to contact the pharmacy document the reason why and proceed with request.) no refills (Agent: If yes, when and what did the pharmacy advise?)  This is the patient's preferred pharmacy:  [facility] - [place], [place] - [address] [place] [address] Phone: [phone] Fax: [phone]  Is this the correct pharmacy for this prescription? Yes If no, delete pharmacy and type the correct one.   Has the prescription been filled recently? Yes  Is the patient out of the medication? Yes  Has the patient been seen for an appointment in the last year OR does the patient have an upcoming appointment? Yes  Can we respond through MyChart? Yes  Agent: Please be advised that Rx refills may take up to 3 business days. We ask that you follow-up with your pharmacy.

## 2024-09-13 ENCOUNTER — Ambulatory Visit: Payer: Self-pay

## 2024-09-13 ENCOUNTER — Ambulatory Visit (HOSPITAL_BASED_OUTPATIENT_CLINIC_OR_DEPARTMENT_OTHER): Admitting: Orthopaedic Surgery

## 2024-09-13 NOTE — Telephone Encounter (Signed)
 noted

## 2024-09-13 NOTE — Telephone Encounter (Signed)
 FYI Only or Action Required?: FYI only for provider: ED or [facility] advised.  Patient was last seen in primary care on 08/09/2024 by Jasmine Mustard, FNP.  Called Nurse Triage reporting Shoulder Pain.  Symptoms began 2 weeks ago.  Interventions attempted: OTC medications: Ibuprofen  800mg .  Symptoms are: unchanged.  Triage Disposition: Go to ED Now (or PCP Triage)  Patient/caregiver understands and will follow disposition?: Yes   Message from Harlene ORN sent at 09/13/2024 11:31 AM EST  Reason for Triage: severe pain in her rotator cuff and gout flare up   Reason for Disposition  [1] SEVERE pain AND [2] not improved 2 hours after pain medicine  Answer Assessment - Initial Assessment Questions Patient called reporting continuous severe right shoulder pain. Patient recently seen in ED for same, discharged 09/06/24.   1. ONSET: When did the pain start?     2 weeks  2. LOCATION: Where is the pain located?     Right shoulder  3. PAIN: How bad is the pain? (Scale 1-10; or mild, moderate, severe)     10/10 during call  4. WORK OR EXERCISE: Has there been any recent work or exercise that involved this part of the body?     Denies injury  5. CAUSE: What do you think is causing the shoulder pain?     Unsure  6. OTHER SYMPTOMS: Do you have any other symptoms? (e.g., neck pain, swelling, rash, fever, numbness, weakness)     Gout pain in both legs and right arm  Protocols used: Shoulder Pain-A-AH

## 2024-09-14 ENCOUNTER — Inpatient Hospital Stay: Payer: Self-pay

## 2024-09-27 ENCOUNTER — Ambulatory Visit: Admitting: Orthopedic Surgery

## 2024-09-27 ENCOUNTER — Inpatient Hospital Stay: Admitting: Family Medicine

## 2024-12-13 ENCOUNTER — Ambulatory Visit
